# Patient Record
Sex: Male | Born: 1935 | Race: White | Hispanic: No | Marital: Married | Smoking: Never smoker
Health system: Southern US, Community
[De-identification: ages and names within clinical notes are randomized; demographics above are authoritative.]

## PROBLEM LIST (undated history)

## (undated) DIAGNOSIS — R351 Nocturia: Secondary | ICD-10-CM

## (undated) DIAGNOSIS — R011 Cardiac murmur, unspecified: Secondary | ICD-10-CM

## (undated) DIAGNOSIS — Z77090 Contact with and (suspected) exposure to asbestos: Secondary | ICD-10-CM

## (undated) DIAGNOSIS — N400 Enlarged prostate without lower urinary tract symptoms: Secondary | ICD-10-CM

## (undated) DIAGNOSIS — D494 Neoplasm of unspecified behavior of bladder: Secondary | ICD-10-CM

## (undated) DIAGNOSIS — J61 Pneumoconiosis due to asbestos and other mineral fibers: Secondary | ICD-10-CM

## (undated) DIAGNOSIS — Z86718 Personal history of other venous thrombosis and embolism: Secondary | ICD-10-CM

## (undated) DIAGNOSIS — T148XXA Other injury of unspecified body region, initial encounter: Secondary | ICD-10-CM

## (undated) DIAGNOSIS — Z87898 Personal history of other specified conditions: Secondary | ICD-10-CM

## (undated) DIAGNOSIS — C4490 Unspecified malignant neoplasm of skin, unspecified: Secondary | ICD-10-CM

## (undated) DIAGNOSIS — Z923 Personal history of irradiation: Secondary | ICD-10-CM

## (undated) DIAGNOSIS — Z86711 Personal history of pulmonary embolism: Secondary | ICD-10-CM

## (undated) DIAGNOSIS — C679 Malignant neoplasm of bladder, unspecified: Secondary | ICD-10-CM

## (undated) DIAGNOSIS — Z9889 Other specified postprocedural states: Secondary | ICD-10-CM

## (undated) HISTORY — DX: Malignant neoplasm of bladder, unspecified: C67.9

## (undated) HISTORY — PX: TRANSTHORACIC ECHOCARDIOGRAM: SHX275

## (undated) HISTORY — PX: TRANSURETHRAL RESECTION OF PROSTATE: SHX73

## (undated) HISTORY — PX: OTHER SURGICAL HISTORY: SHX169

## (undated) HISTORY — DX: Personal history of irradiation: Z92.3

---

## 1999-05-28 ENCOUNTER — Encounter: Admission: RE | Admit: 1999-05-28 | Discharge: 1999-05-28 | Payer: Self-pay

## 1999-06-01 ENCOUNTER — Ambulatory Visit (HOSPITAL_BASED_OUTPATIENT_CLINIC_OR_DEPARTMENT_OTHER): Admission: RE | Admit: 1999-06-01 | Discharge: 1999-06-01 | Payer: Self-pay

## 1999-06-01 HISTORY — PX: OTHER SURGICAL HISTORY: SHX169

## 2000-01-13 ENCOUNTER — Encounter: Payer: Self-pay | Admitting: Emergency Medicine

## 2000-01-13 ENCOUNTER — Inpatient Hospital Stay (HOSPITAL_COMMUNITY): Admission: EM | Admit: 2000-01-13 | Discharge: 2000-01-14 | Payer: Self-pay | Admitting: Emergency Medicine

## 2000-01-13 HISTORY — PX: OTHER SURGICAL HISTORY: SHX169

## 2000-01-21 ENCOUNTER — Ambulatory Visit (HOSPITAL_COMMUNITY): Admission: RE | Admit: 2000-01-21 | Discharge: 2000-01-21 | Payer: Self-pay | Admitting: Urology

## 2000-01-31 ENCOUNTER — Emergency Department (HOSPITAL_COMMUNITY): Admission: EM | Admit: 2000-01-31 | Discharge: 2000-01-31 | Payer: Self-pay | Admitting: Emergency Medicine

## 2000-11-17 ENCOUNTER — Observation Stay (HOSPITAL_COMMUNITY): Admission: RE | Admit: 2000-11-17 | Discharge: 2000-11-18 | Payer: Self-pay | Admitting: Urology

## 2000-11-17 ENCOUNTER — Encounter (INDEPENDENT_AMBULATORY_CARE_PROVIDER_SITE_OTHER): Payer: Self-pay | Admitting: Specialist

## 2005-05-31 ENCOUNTER — Emergency Department (HOSPITAL_COMMUNITY): Admission: EM | Admit: 2005-05-31 | Discharge: 2005-05-31 | Payer: Self-pay | Admitting: Emergency Medicine

## 2009-10-10 HISTORY — PX: VENA CAVA FILTER PLACEMENT: SUR1032

## 2010-06-30 ENCOUNTER — Ambulatory Visit
Admission: RE | Admit: 2010-06-30 | Discharge: 2010-06-30 | Payer: Self-pay | Source: Home / Self Care | Attending: Urology | Admitting: Urology

## 2010-06-30 HISTORY — PX: TRANSURETHRAL RESECTION OF BLADDER TUMOR: SHX2575

## 2010-07-31 ENCOUNTER — Ambulatory Visit
Admission: RE | Admit: 2010-07-31 | Discharge: 2010-08-01 | Payer: Self-pay | Source: Home / Self Care | Attending: Urology | Admitting: Urology

## 2010-07-31 HISTORY — PX: TRANSURETHRAL RESECTION OF PROSTATE: SHX73

## 2010-08-03 LAB — POCT HEMOGLOBIN-HEMACUE: Hemoglobin: 13.3 g/dL (ref 13.0–17.0)

## 2010-08-14 NOTE — Op Note (Signed)
NAME:  Omar [name], Omar [name] NO.:  [id number]  MEDICAL RECORD NO.:  [id number]          PATIENT TYPE:  AMB  LOCATION:  [facility]                         FACILITY:  [facility]  PHYSICIAN:  Danae Chen, M.D.  DATE OF BIRTH:  10/01/1915  DATE OF PROCEDURE:  07/31/2010 DATE OF DISCHARGE:                              OPERATIVE REPORT   PREOPERATIVE DIAGNOSES: 1. Benign prostatic hypertrophy. 2. Bladder tumor.  POSTOPERATIVE DIAGNOSES: 1. Benign prostatic hypertrophy. 2. Bladder tumor.  PROCEDURES: 1. Cystoscopy. 2. Transurethral resection of prostate. 3. Transurethral resection, bladder tumor.  SURGEON:  Danae Chen, M.D.  ANESTHESIA:  General.  INDICATIONS:  The patient is a 78 year old who was found on cystoscopy for microhematuria to have a bladder tumor.  The prostatic urethra is very tortuous.  He had previous TURP in the past.  He had a TUR of the bladder tumor about a month ago, however, it was difficult to complete the resection because of the tortuous urethra and the location of the tumor on the anterior wall of the bladder near the bladder dome.  A repeat cystoscopy showed residual tumor in the bladder.  He is scheduled today for cystoscopy, resection of prostate tissue and TUR of bladder tumor.  DESCRIPTION OF PROCEDURE:  The patient was identified by his wristband and proper time-out was taken.  Under general anesthesia, he was prepped and draped and placed in the dorsal lithotomy position.  A panendoscope was inserted in the bladder. The anterior urethra was normal.  The prostatic urethra was very tortuous and making difficult to insert the cystoscope in the bladder but the cystoscope could be passed in the bladder.  The bladder was trabeculated.  There was a 3-cm tumor on the anterior wall of the bladder near the dome.  The ureteral orifices were in normal position and shape.  The cystoscope was then removed.  The urethra was dilated up to #28-French  with Sissy Hoff sounds, then a #26 long resectoscope was inserted in the bladder.  Resection of the prostate gland mainly the right lateral and left lateral lobes of the prostate was done from the bladder neck to the verumontanum.  The patient had a long prostatic urethra.  The prostatic chips were then irrigated out of the bladder. Hemostasis was secured with electrocautery.  There was minimal bleeding at the end of resection of the prostate.  Then, attention was turned to the bladder tumor on the anterior wall of the bladder near the dome.  With the long resectoscope, I was able to resect all the bladder tumor.  The margins of resection of the bladder tumor were fulgurated over a 2 cm area.  There was no bleeding at the end of the procedure.  The specimen was irrigated out of the bladder. Then the resectoscope was removed.  A #24 Foley catheter was then inserted without difficulty in the bladder.  The patient tolerated the procedure well and left the OR in satisfactory condition to postanesthesia care unit.     Danae Chen, M.D.     MN/MEDQ  D:  07/31/2010  T:  07/31/2010  Job:  [id number]  Electronically Signed by Lindaann Slough M.D. on 08/14/2010 01:09:50 PM

## 2010-09-21 LAB — URINE MICROSCOPIC-ADD ON

## 2010-09-21 LAB — COMPREHENSIVE METABOLIC PANEL
AST: 15 U/L (ref 0–37)
Albumin: 3.3 g/dL — ABNORMAL LOW (ref 3.5–5.2)
Alkaline Phosphatase: 62 U/L (ref 39–117)
BUN: 28 mg/dL — ABNORMAL HIGH (ref 6–23)
Chloride: 108 mEq/L (ref 96–112)
Potassium: 3.8 mEq/L (ref 3.5–5.1)
Total Bilirubin: 0.7 mg/dL (ref 0.3–1.2)

## 2010-09-21 LAB — DIFFERENTIAL
Basophils Absolute: 0 10*3/uL (ref 0.0–0.1)
Basophils Relative: 0 % (ref 0–1)
Eosinophils Relative: 3 % (ref 0–5)
Monocytes Absolute: 0.5 10*3/uL (ref 0.1–1.0)
Neutro Abs: 4.4 10*3/uL (ref 1.7–7.7)

## 2010-09-21 LAB — URINALYSIS, ROUTINE W REFLEX MICROSCOPIC
Glucose, UA: NEGATIVE mg/dL
Ketones, ur: 40 mg/dL — AB
Nitrite: POSITIVE — AB
Protein, ur: >300 mg/dL — AB
Specific Gravity, Urine: 1.01 (ref 1.005–1.030)
Urobilinogen, UA: 0.2 mg/dL (ref 0.0–1.0)
pH: 5 (ref 5.0–8.0)

## 2010-09-21 LAB — CBC
Hemoglobin: 14.3 g/dL (ref 13.0–17.0)
MCV: 91.9 fL (ref 78.0–100.0)
Platelets: 124 10*3/uL — ABNORMAL LOW (ref 150–400)
RBC: 4.43 MIL/uL (ref 4.22–5.81)
WBC: 6.2 10*3/uL (ref 4.0–10.5)

## 2010-09-21 LAB — URINE CULTURE: Colony Count: NO GROWTH

## 2010-11-27 NOTE — H&P (Signed)
[facility]  Patient:    Omar [name], Omar [name]                    MRN: [id number] Adm. Date:  01/13/00 Attending:  Verl Dicker, M.D. CC:         Hadassah Pais. Jeannetta Nap, M.D.                         History and Physical  DATE OF BIRTH:  [birth year]  REFERRING PHYSICIAN:  Hadassah Pais. Jeannetta Nap, M.D.  UROLOGIST:  Claudette Laws, M.D.  ON-CALL UROLOGIST:  Verl Dicker, M.D.  CHIEF COMPLAINT:  This 77 year old white male with retention and impassable bladder neck contracture.  HISTORY OF PRESENT ILLNESS: 1. In 1999, a TURP for benign prostatic hypertrophy by Dr. Etta Grandchild. 2. In 2001, the patient has had progressive obstructive voiding symptoms.    No improvement despite appropriate antibiotic therapy. 3. On January 13, 2000, the patient claims a relatively recent onset of markedly    decreased force of stream with frequency, unable to pass a Foley catheter.    There was blood at the meatus at the time the patient was seen in the    emergency room by Dr. Wanda Plump.  A flexible cystoscopy was done in the    emergency room showing an impassable bladder neck contracture.  Plans made    for a cystoscopy under anesthesia and possible SP tube placement.  CURRENT MEDICATIONS:   Antibiotics p.o. per Dr. Etta Grandchild over the last several weeks.  ALLERGIES:  Denied.  SOCIAL HISTORY:  Tobacco:  Denies.  ETOH:  Denies.  The patient is a resident of [place], accompanied by his wife.  PAST MEDICAL HISTORY:  Inguinal hernia repair in December 2000, Dr. Zigmund Daniel.  PHYSICAL EXAMINATION/REVIEW OF SYSTEMS:  VITAL SIGNS:  Temperature 97 degrees, respirations 16, pulse 58, blood pressure  148/65.  HEENT/NECK:  Negative adenopathy, negative bruit.  LUNGS:  Clear to P&A.  HEART:  Regular rate and rhythm, without murmur or gallop.  ABDOMEN:  Soft, protuberant.  Well-repaired hernia incisions.  No evidence of recurrent hernia.  GENITOURINARY:   Bilaterally distended testes.  RECTAL:  Smooth rounded prostate.  NEUROLOGIC:  Grossly intact.  LABORATORY DATA:  Electrocardiogram:  Sinus bradycardia with left anterior fascicular block.  White count 6.5, hemoglobin 15.8, hematocrit 46.6, platelets 157.  PTT 27, PT 13.3. BMET pending at the time of this dictation.  Chest x-ray pending at the time of this dictation.  IMPRESSION:  An 77 year old male with bladder neck contracture, two years after a transurethral resection of the prostate.  Unable to place a Foley catheter.  PLAN:  Give 80 mg of IM gentamicin, 1 g of IV Ancef.  A cystoscopy under anesthesia.  Possible DVIU.  Possible TURBNC, possible SP tube placement. DD:  01/13/00 TD:  01/13/00 Job: [id number]

## 2010-11-27 NOTE — Op Note (Signed)
[facility]  Patient:    Omar [name], Omar [name]                     MRN: [id number] Proc. Date: 11/17/00 Adm. Date:  [id number] Attending:  Monica Becton                           Operative Report  PREOPERATIVE DIAGNOSES: 1. Benign prostatic hypertrophy with acute urinary retention. 2. Status post transurethral resection of prostate in 1999. 3. History of bladder neck contracture status post direct vision urethrotomy.  POSTOPERATIVE DIAGNOSES: 1. Benign prostatic hypertrophy with acute urinary retention. 2. Status post transurethral resection of prostate in 1999. 3. History of bladder neck contracture status post direct vision urethrotomy.  OPERATION PERFORMED:  Cystoscopy and transurethral resection of prostate gland.  SURGEON:  Dr. Etta Grandchild.  DESCRIPTION OF PROCEDURE:  The patient was prepped and draped in the dorsal lithotomy position under spinal anesthesia. His Foley catheter was removed. He was given IV gentamycin preop.  Cystoscopy confirmed a normal anterior urethra. He had a rather elongated prostatic urethra with multiple inflammatory bladder neck polyps. There was some residual tissue anteriorly and also at the apical area. Otherwise the bladder neck seemed to be wide open. The bladder itself showed no obvious tumors although it was somewhat mirky, bloody and had some catheter cystitis. The orifices were not identifiable. Following urethral dilation with Sissy Hoff sounds to a #30 [place], a 28 French gyrus resectoscope unit was placed into the bladder with the Wandra Scot working element transurethral with resecting, resecting out the residual tissue anteriorly, laterally and toward the apex, only a few grams were obtained. There was minimal bleeding throughout. I evacuated out all clots and chips at the end and put in a 22 [place] 30 cc Nigel Berthold, he was clear. He was then taken back to the recovery room in satisfactory condition. DD:   11/17/00 TD:  11/17/00 Job: [id number]

## 2010-11-27 NOTE — Op Note (Signed)
[facility]  Patient:    Omar [name], Omar [name]                    MRN: [id number] Attending:  Verl Dicker, M.D.                           Operative Report  DATE OF BIRTH:  29-Oct-2015.  REFERRING PHYSICIAN:  ___, M.D.  UROLOGISTVerl Dicker, M.D.             Ron ___, M.D.  PREOPERATIVE DIAGNOSIS:  Impassable bladder neck contracture.  POSTOPERATIVE DIAGNOSIS:  Impassable bladder neck contracture.  PROCEDURE:  Cystoscopy under anesthesia and placement of open SP tube.  ANESTHESIA:  Spinal.  DRAINS:  24 French Ainsworth SP tube.  SPECIMENS:  None.  COMPLICATIONS:  None.  DESCRIPTION OF PROCEDURE:  The patient was prepped and draped in the dorsal lithotomy position after institution of adequate level of spinal anesthesia where a lubricated 21 French Penn endoscope was gently inserted at the urethral meatus.  Normal urethra and sphincter.  Patient had impassable contracture of the bladder neck.  Careful examination of the area with cystoscope showed no passage to the bladder neck.  The area was carefully probed with a 5 French ureteral catheter.  No tract or path could be established to the bladder despite carefully looking for a period of over 20-30 minutes.  The decision was made to forego further cystoscopy, as varices within the previous prostatic urethra made visualization difficulty.  Patient was then taken out of lithotomy, reprepped and draped.  A 6 cm transverse incision was made above the symphysis pubis through the skin and subcutaneous tissue.  Fibers of the rectus abdominis were then divided horizontally. Muscular fibers beneath the rectus sheath were then divided vertically. Distended bladder was easily identified.  1-2 cm incision was then made in the anterior bladder with immediate return of about 700 cc of pink-tinged urine. A 24 [place] Ainsworth catheter with a 30 cc balloon was then brought through the skin and  fascia, directed into the bladder, purse-string suture of 0 chromic used on the anterior bladder wall.  Rectus fascia was reapproximated with interrupted sutures of 0 Vicryl.  Skin was reapproximated with skin staples.  Ainsworth catheter was sewn to the skin with a 2-0 nylon stitch. Pink urine irrigated easily.  SP tube was left to straight drain.  No drain was left indwelling and the patient was returned to recovery in satisfactory condition. DD:  01/13/00 TD:  01/13/00 Job: [id number]

## 2010-11-27 NOTE — Op Note (Signed)
[facility]  Patient:    Omar [name], Omar [name]                     MRN: [id number] Proc. Date: 01/13/00 Adm. Date:  [id number] Attending:  Ellwood Handler                           Operative Report  HISTORY:  This is an 77 year old gentleman who came in yesterday with an impassible bladder neck contracture.  He was seen by Dr. Verl Dicker, who took him to surgery and put in a suprapubic cystostomy tube.  By history, the patient had a TUR in 1999 and recently has had progressive symptoms of bladder outlet obstruction.  I had seen him recently in the office and put him on some antibiotics; otherwise, the rest of the history is documented on the admitting H&P by Dr. Verl Dicker.  LABORATORY AND X-RAY FINDINGS:  Hemoglobin 15.8, hematocrit 46.1, white cell count was 6500.  His PT was 27, ______ was 13.3 and INR was 1.1.  Chest x-ray showed moderate cardiomegaly and aortic elongation and calcification and some pleural thickening.  EKG showed marked sinus bradycardia with sinus arrhythmia.  COURSE IN THE HOSPITAL:  The patient was seen early in the day on January 13, 2000 by Dr. Verl Dicker, taken to surgery, underwent cystoscopy and insertion of an SP tube, a 24-French Ainsworth catheter.  Postoperatively, he did well.  I saw him the next day.  He was feeling better.  We discussed followup care and he is to come into the office for his routine appointment on Monday, so I thought I would see him and then make plans for a TUR of bladder neck contracture possibly next week, once he settles down.  The patient has some antibiotics at home and pain pills, which he will continue to take.  We instructed him on how to hook the SP tube to a leg bag.  FINAL DIAGNOSIS:  Impassible bladder neck contracture.  OPERATION:  Cystoscopy and insertion of a suprapubic cystostomy under spinal anesthesia (a 24-French Ainsworth) suprapubic  tube.  COMPLICATIONS:  None.  CONDITION ON DISCHARGE:  Improving.  DISCHARGE MEDICATIONS:  His antibiotics, pain pills, all other home medicines.  FOLLOWUP:  To see me in four days for followup. DD:  01/14/00 TD:  01/14/00 Job: [id number]

## 2010-11-27 NOTE — Op Note (Signed)
[place]  Patient:    Omar [name]                      MRN: [id number] Proc. Date: 06/01/99 Adm. Date:  [id number] Attending:  Meredith Leeds                           Operative Report  PREOPERATIVE DIAGNOSIS:  Recurrent indirect left inguinal hernia.  POSTOPERATIVE DIAGNOSIS:  Recurrent indirect left inguinal hernia.  OPERATION PERFORMED:  Repair of inguinal hernia.  SURGEON:  Zigmund Daniel, M.D.  ANESTHESIA:  Local with monitored anesthesia care and sedation.  DESCRIPTION OF PROCEDURE:  Following adequate monitoring and sedation and routine preparation and draping of the left inguinal region, I liberally infused a local anesthetic in the operative area.  I reinforced that after I had deepened the incision.  I removed the old skin scar and dissected down through the fat to the external oblique and cleared the fat away from it a short distance, identified he external ring and opened the aponeurosis of the external oblique in the direction of the fibers into the external ring.  I then carefully dissected up the spermatic cord, dissecting it off the floor and away from the external oblique taking care not to injure the ilioinguinal nerve.  I controlled it with a Penrose drain and I also identified the inferior epigastric artery and the vas deferens and did not  harm them.  I saw that there was no direct hernia.  I then dissected the proximal part of the spermatic cord and identified a large indirect hernia and some preperitoneal fat and dissected that away from the vessels and the vas, reduced it through the superior part of the internal ring and held it in with a plug of Marlex mesh which I held in with a single suture of 2-0 silk.  I then fashioned a patch of Marlex mesh with a slit made in it to fit on the inguinal floor and allow eggress of the spermatic cord.  I sewed that in place from the pubic tubercle  laterally and inferiorly along the inguinal ligament and superiorly and medially in the internal oblique fascia and joined the tails of the mesh together with two sutures lateral to the internal ring.  I felt that the repair was secure.  Hemostasis was good. I closed the external oblique with running 3-0 Vicryl, closed the subcutaneous tissues with running 3-0 Vicryl and closed the skin with intracuticular 4-0 Vicryl and Steri-Strips.  The patient tolerated the operation well. DD:  06/01/99 TD:  06/02/99 Job: [id number]

## 2010-11-27 NOTE — Discharge Summary (Signed)
[facility]  Patient:    Omar [name], Omar [name]                     MRN: [id number] Proc. Date: 11/17/00 Adm. Date:  [id number] Disc. Date: [id number] Attending:  Monica Becton                           Discharge Summary  HISTORY OF PRESENT ILLNESS:  This is an 77 year old man who developed acute urinary retention about a week ago from residual BPH.  He had a TUR about three years ago and, then, developed a bladder neck contracture last year which eventually required a direct vision urethrotomy.  Since then, he has been doing well although recently he developed progressive symptoms of retention.  A catheter was placed in the office.  Several hundred ccs of urine was obtained.  A cystoscopy at that time confirmed some residual BPH which I thought should be resected out.  Patient came in as an a.m. admission for a TUR.  He has been tolerating his catheter well the past week.  He has been off aspirin now for about one week.  His EKG showed sinus bradycardia. Electrolytes were normal with a BUN of 21, a creatinine of 1.2.  Random glucose was 126.  INR was 1.2.  Hemoglobin 14.6, hematocrit 43.0.  HOSPITAL COURSE:  The patient came in as a.m. overnight stay on Nov 17, 2000, underwent a TUR of his prostate with the Gyrus resectoscope.  Postoperatively, he did well.  The following morning, we removed the catheter.  He was voiding satisfactorily albeit with slightly bloody urine, no clots, good control.  He had no complaints.  He was feeling good, afebrile and, so, he was discharged.  The pathology report is still pending.  IMPRESSION: 1. Benign prostatic hypertrophy with acute urinary retention. 2. Past history of a transurethral resection of the prostate and direct vision    urethrotomy for bladder neck contracture.  OPERATION:  Cystoscopy, transurethral resection of prostate.  COMPLICATIONS:  None.  CONDITION ON DISCHARGE:  Recovering.  DISCHARGE  MEDICATIONS: 1. Levaquin 250 q.d., #5. 2. Pyridium 200 mg every 4 hours for burning.  He will stay off his aspirin for one month now but renew all of his other home medications.  DISPOSITION:  Regular diet, force fluids, limited activity.  To see me in the office in the next two to three weeks. DD:  11/18/00 TD:  11/19/00 Job: [id number]

## 2012-10-16 ENCOUNTER — Encounter (HOSPITAL_BASED_OUTPATIENT_CLINIC_OR_DEPARTMENT_OTHER): Payer: Self-pay | Admitting: *Deleted

## 2012-10-16 ENCOUNTER — Other Ambulatory Visit: Payer: Self-pay | Admitting: Urology

## 2012-10-16 NOTE — Progress Notes (Addendum)
SPOKE W/ PT AND LM FOR SON, JERRY, TO CALL BACK AND WILL GIVE INSTRUCTIONS TO HIM AS WELL. NPO AFTER MN. ARRIVES AT 0945. PT STATES CXR 2 WKS AGO..  WILL GET INFO FROM SON AND REQUEST IT TO BE FAXED.  SPOKE W/ PT SON, JERRY, TODAY.  HE STATES PT HAD CXR DONE AT DR Jeannetta Nap 2 WKS AGO. BUT WHEN I CALLED DR Jeannetta Nap OFFICE STATES NO RECORD OF REPORT. CALLED SON BACK AND HE STATES HE WILL CALL THE OFFICE AND CLARIFY THIS AND GIVE THEM MY PHONE # TO HAVE THE REPORT FAXED. ALSO, CALLED DR Donnie Aho OFFICE FOR EKG AND ECHO TO BE FAXED.  INSTRUCTIONS GIVEN TO SON , NPO AFTER MN , ARRIVE AT 0945 , AND BRING PT MED. LIST TO VERIFY.  PT SON CALLED AND LM THAT HE HAS CXR REPORT , WILL BRING DOS.

## 2012-10-17 ENCOUNTER — Encounter (HOSPITAL_BASED_OUTPATIENT_CLINIC_OR_DEPARTMENT_OTHER): Payer: Self-pay | Admitting: *Deleted

## 2012-10-20 ENCOUNTER — Encounter (HOSPITAL_BASED_OUTPATIENT_CLINIC_OR_DEPARTMENT_OTHER)
Admission: RE | Disposition: A | Discharge status: Home / Self Care | Payer: Self-pay | Source: Ambulatory Visit | Attending: Urology

## 2012-10-20 ENCOUNTER — Encounter (HOSPITAL_BASED_OUTPATIENT_CLINIC_OR_DEPARTMENT_OTHER): Payer: Self-pay

## 2012-10-20 ENCOUNTER — Ambulatory Visit (HOSPITAL_BASED_OUTPATIENT_CLINIC_OR_DEPARTMENT_OTHER)
Admission: RE | Admit: 2012-10-20 | Discharge: 2012-10-20 | Disposition: A | Discharge status: Home / Self Care | Payer: Medicare Other | Source: Ambulatory Visit | Attending: Urology | Admitting: Urology

## 2012-10-20 ENCOUNTER — Encounter (HOSPITAL_BASED_OUTPATIENT_CLINIC_OR_DEPARTMENT_OTHER): Payer: Self-pay | Admitting: Anesthesiology

## 2012-10-20 ENCOUNTER — Ambulatory Visit (HOSPITAL_BASED_OUTPATIENT_CLINIC_OR_DEPARTMENT_OTHER): Payer: Medicare Other | Admitting: Anesthesiology

## 2012-10-20 DIAGNOSIS — Z79899 Other long term (current) drug therapy: Secondary | ICD-10-CM | POA: Insufficient documentation

## 2012-10-20 DIAGNOSIS — C679 Malignant neoplasm of bladder, unspecified: Secondary | ICD-10-CM | POA: Insufficient documentation

## 2012-10-20 DIAGNOSIS — Z7982 Long term (current) use of aspirin: Secondary | ICD-10-CM | POA: Insufficient documentation

## 2012-10-20 DIAGNOSIS — N3289 Other specified disorders of bladder: Secondary | ICD-10-CM | POA: Insufficient documentation

## 2012-10-20 DIAGNOSIS — N323 Diverticulum of bladder: Secondary | ICD-10-CM | POA: Insufficient documentation

## 2012-10-20 HISTORY — DX: Personal history of other venous thrombosis and embolism: Z86.718

## 2012-10-20 HISTORY — DX: Other specified postprocedural states: Z98.890

## 2012-10-20 HISTORY — DX: Contact with and (suspected) exposure to asbestos: Z77.090

## 2012-10-20 HISTORY — DX: Nocturia: R35.1

## 2012-10-20 HISTORY — DX: Malignant neoplasm of bladder, unspecified: C67.9

## 2012-10-20 HISTORY — PX: TRANSURETHRAL RESECTION OF BLADDER TUMOR: SHX2575

## 2012-10-20 HISTORY — DX: Cardiac murmur, unspecified: R01.1

## 2012-10-20 HISTORY — DX: Benign prostatic hyperplasia without lower urinary tract symptoms: N40.0

## 2012-10-20 HISTORY — PX: CYSTOSCOPY: SHX5120

## 2012-10-20 HISTORY — DX: Personal history of other specified conditions: Z87.898

## 2012-10-20 HISTORY — DX: Personal history of pulmonary embolism: Z86.711

## 2012-10-20 HISTORY — DX: Neoplasm of unspecified behavior of bladder: D49.4

## 2012-10-20 SURGERY — TURBT (TRANSURETHRAL RESECTION OF BLADDER TUMOR)
Anesthesia: General | Site: Bladder | Wound class: Clean Contaminated

## 2012-10-20 MED ORDER — EPHEDRINE SULFATE 50 MG/ML IJ SOLN
INTRAMUSCULAR | Status: DC | PRN
  Administered 2012-10-20: 10 mg via INTRAVENOUS

## 2012-10-20 MED ORDER — FENTANYL CITRATE 0.05 MG/ML IJ SOLN
25.0000 ug | INTRAMUSCULAR | Status: DC | PRN
  Filled 2012-10-20: qty 1

## 2012-10-20 MED ORDER — LIDOCAINE HCL (CARDIAC) 20 MG/ML IV SOLN
INTRAVENOUS | Status: DC | PRN
  Administered 2012-10-20: 60 mg via INTRAVENOUS

## 2012-10-20 MED ORDER — DEXAMETHASONE SODIUM PHOSPHATE 4 MG/ML IJ SOLN
INTRAMUSCULAR | Status: DC | PRN
  Administered 2012-10-20: 4 mg via INTRAVENOUS

## 2012-10-20 MED ORDER — CEFAZOLIN SODIUM 1-5 GM-% IV SOLN
1.0000 g | INTRAVENOUS | Status: DC
  Filled 2012-10-20: qty 50

## 2012-10-20 MED ORDER — PROMETHAZINE HCL 25 MG/ML IJ SOLN
6.2500 mg | INTRAMUSCULAR | Status: DC | PRN
  Filled 2012-10-20: qty 1

## 2012-10-20 MED ORDER — GLYCINE 1.5 % IR SOLN
Status: DC | PRN
  Administered 2012-10-20: 18000 mL

## 2012-10-20 MED ORDER — SODIUM CHLORIDE 0.9 % IR SOLN
Status: DC | PRN
  Administered 2012-10-20: 9000 mL

## 2012-10-20 MED ORDER — CEFAZOLIN SODIUM-DEXTROSE 2-3 GM-% IV SOLR
2.0000 g | INTRAVENOUS | Status: AC
  Administered 2012-10-20: 2 g via INTRAVENOUS
  Filled 2012-10-20: qty 50

## 2012-10-20 MED ORDER — PROPOFOL 10 MG/ML IV BOLUS
INTRAVENOUS | Status: DC | PRN
  Administered 2012-10-20: 130 mg via INTRAVENOUS

## 2012-10-20 MED ORDER — FENTANYL CITRATE 0.05 MG/ML IJ SOLN
INTRAMUSCULAR | Status: DC | PRN
  Administered 2012-10-20 (×2): 25 ug via INTRAVENOUS
  Administered 2012-10-20: 50 ug via INTRAVENOUS
  Administered 2012-10-20: 25 ug via INTRAVENOUS

## 2012-10-20 MED ORDER — ONDANSETRON HCL 4 MG/2ML IJ SOLN
INTRAMUSCULAR | Status: DC | PRN
  Administered 2012-10-20: 4 mg via INTRAVENOUS

## 2012-10-20 MED ORDER — MEPERIDINE HCL 25 MG/ML IJ SOLN
6.2500 mg | INTRAMUSCULAR | Status: DC | PRN
  Filled 2012-10-20: qty 1

## 2012-10-20 MED ORDER — LACTATED RINGERS IV SOLN
INTRAVENOUS | Status: DC
  Administered 2012-10-20 (×2): via INTRAVENOUS
  Filled 2012-10-20: qty 1000

## 2012-10-20 SURGICAL SUPPLY — 40 items
BAG DRAIN URO-CYSTO SKYTR STRL (DRAIN) ×2 IMPLANT
BAG DRN ANRFLXCHMBR STRAP LEK (BAG)
BAG DRN UROCATH (DRAIN) ×1
BAG URINE DRAINAGE (UROLOGICAL SUPPLIES) ×1 IMPLANT
BAG URINE LEG 19OZ MD ST LTX (BAG) IMPLANT
CANISTER SUCT LVC 12 LTR MEDI- (MISCELLANEOUS) ×3 IMPLANT
CATH FOLEY 2WAY SLVR  5CC 20FR (CATHETERS)
CATH FOLEY 2WAY SLVR  5CC 22FR (CATHETERS)
CATH FOLEY 2WAY SLVR 5CC 20FR (CATHETERS) IMPLANT
CATH FOLEY 2WAY SLVR 5CC 22FR (CATHETERS) IMPLANT
CLOTH BEACON ORANGE TIMEOUT ST (SAFETY) ×2 IMPLANT
DRAPE CAMERA CLOSED 9X96 (DRAPES) IMPLANT
ELECT LOOP HF 26F 30D .35MM (CUTTING LOOP) IMPLANT
ELECT LOOP MED HF 24F 12D CBL (CLIP) ×1 IMPLANT
ELECT REM PT RETURN 9FT ADLT (ELECTROSURGICAL) ×2
ELECTRODE REM PT RTRN 9FT ADLT (ELECTROSURGICAL) ×1 IMPLANT
EVACUATOR MICROVAS BLADDER (UROLOGICAL SUPPLIES) ×2 IMPLANT
GLOVE BIO SURGEON STRL SZ7 (GLOVE) ×1 IMPLANT
GLOVE BIOGEL PI IND STRL 7.0 (GLOVE) IMPLANT
GLOVE BIOGEL PI INDICATOR 7.0 (GLOVE) ×2
GLOVE SKINSENSE NS SZ7.0 (GLOVE) ×2
GLOVE SKINSENSE NS SZ7.5 (GLOVE) ×1
GLOVE SKINSENSE STRL SZ7.0 (GLOVE) IMPLANT
GLOVE SKINSENSE STRL SZ7.5 (GLOVE) IMPLANT
GLYCINE 1.5% IRRIG UROMATIC (IV SOLUTION) ×6 IMPLANT
HOLDER FOLEY CATH W/STRAP (MISCELLANEOUS) ×1 IMPLANT
IV NS IRRIG 3000ML ARTHROMATIC (IV SOLUTION) ×3 IMPLANT
KIT ASPIRATION TUBING (SET/KITS/TRAYS/PACK) ×1 IMPLANT
LOOP CUTTING 24FR OLYMPUS (CUTTING LOOP) IMPLANT
NDL SAFETY ECLIPSE 18X1.5 (NEEDLE) IMPLANT
NEEDLE HYPO 18GX1.5 SHARP (NEEDLE)
NEEDLE HYPO 22GX1.5 SAFETY (NEEDLE) IMPLANT
NS IRRIG 500ML POUR BTL (IV SOLUTION) ×1 IMPLANT
PACK CYSTOSCOPY (CUSTOM PROCEDURE TRAY) ×2 IMPLANT
PLUG CATH AND CAP STER (CATHETERS) IMPLANT
SET ASPIRATION TUBING (TUBING) IMPLANT
SILICONE FOLEY ×1 IMPLANT
SYR 20CC LL (SYRINGE) IMPLANT
SYRINGE IRR TOOMEY STRL 70CC (SYRINGE) ×1 IMPLANT
WATER STERILE IRR 3000ML UROMA (IV SOLUTION) ×1 IMPLANT

## 2012-10-20 NOTE — Op Note (Signed)
Omar [name] is a 79 y.o.   10/20/2012  Pre-op diagnosis: Recurrent bladder tumor  Postop diagnosis: Same  Procedure done: Cystoscopy TUR bladder tumor  Surgeon: Wendie Simmer. Roby Donaway  Anesthesia: General  Indication: Patient is a 79 years old male who had a TUR bladder tumor in December 2011. He had not had any recurrence until his last cystoscopy about 2 weeks ago. He is scheduled for cystoscopy and TUR bladder tumor.  Procedure: The patient was identified by his wrist band and proper timeout was taken.  Under general anesthesia he was prepped and draped and placed in the dorsolithotomy position. A panendoscope could not be passed through the meatus because of meatal stenosis. The meatus was then dilated with Von Buren sounds up to #30 [place]. Then a panendoscope was passed in the bladder. The anterior urethra is normal. He has a very long prostatic urethra. He had a previous TURP. I was able to pass the cystoscope in the bladder. The bladder is heavily trabeculated with cellules and diverticula. There is a papillary tumor on the left side of the bladder that measures about 4 cm. There is no evidence of other tumor in the bladder. The cystoscope was removed.  A gyrus resectoscope was inserted in the bladder. I was able to identify the bladder tumor but the resectoscope was short and I was not able to resect the tumor with the short resectoscope. I then inserted along the resectoscope in the bladder. I resected the bladder tumor as much as possible. I believe there is still residual tumor in the bladder. I fulgurated no more site of the bladder tumor as well as a 2 cm margin of resection. Hemostasis was completed with electrocautery. The specimen was then irrigated out of the bladder. The resectoscope was removed. A #20 French Foley catheter was inserted in the bladder. The catheter will be left to straight drainage.   EBL: Minimal.  The patient tolerated the procedure well and left the OR in  satisfactory condition to postanesthesia care unit.  We will await pathology report for further a decision regarding management of the bladder tumor. He would probably need CT scan of the abdomen and pelvis to assess the extent of the disease.  Cc: Dr. Windle Guard        Dr. Viann Fish

## 2012-10-20 NOTE — H&P (Signed)
History of Present Illness     Omar [name] returns today for surveillance cystoscopy.  He had TURP and TURBT in December 2011.  He has not had any recurrence since.  He has seen blood in his urine intermittently.  Urine culture 2 weeks ago was negative.  Cystoscopy shows recurrent bladder tumor on the left lateral wall of the bladder that measures about 5 cm.  He needs TURBT.  Will contact Dr Windle Guard for clearance.   Surgical History Problems  1. History of  Cystoscopy With Fulguration Large Lesion (Over 5cm) 2. History of  Cystoscopy With Fulguration Medium Lesion (2-5cm) 3. History of  Inguinal Hernia Repair 4. History of  Transuret Resect Of Regrow Obstr Tis Longer Than 1 Yr Postop 5. History of  Transurethral Resection Of Prostate  Current Meds 1. Aspirin 81 MG Oral Tablet; Therapy: (Recorded:27Dec2011) to 2. Calcium 600 TABS; Therapy: (Recorded:14Dec2007) to 3. Glucosamine-Chondroitin TABS; Therapy: (Recorded:22May2008) to 4. Levofloxacin 250 MG Oral Tablet; TAKE 1 TABLET DAILY; Therapy: 04Mar2014 to  (Evaluate:11Mar2014)  Requested for: 04Mar2014; Last Rx:04Mar2014 5. Lutein TABS; Therapy: (Recorded:14Dec2007) to 6. Multi-Vitamin TABS; Therapy: (Recorded:22May2008) to 7. Saw Palmetto CAPS; Therapy: (Recorded:14Dec2007) to 8. Tamsulosin HCl 0.4 MG Oral Capsule; TAKE 1 CAPSULE Bedtime; Therapy: 26May2011 to (Last  Rx:01Dec2011) 9. Viagra 50 MG Oral Tablet; Take 1 tablet as needed; Therapy: 04Nov2008 to  (Evaluate:03May2009); Last Rx:04Nov2008  Allergies Medication  1. No Known Drug Allergies  Family History Problems  1. Family history of  Family Health Status Number Of Children 4  Social History Problems  1. Family history of  Death In The Family Father 61 2. Family history of  Death In The Family Mother 33 3. Marital History - Currently Married 4. Never A Smoker 5. Occupation: Retired Denied  6. Alcohol Use 7. Tobacco Use V15.82  Review of  Systems Genitourinary, constitutional, skin, eye, otolaryngeal, hematologic/lymphatic, cardiovascular, pulmonary, endocrine, musculoskeletal, gastrointestinal, neurological and psychiatric system(s) were reviewed and pertinent findings if present are noted.    Physical Exam Constitutional: Well nourished and well developed . No acute distress.  ENT:. The ears and nose are normal in appearance.  Neck: The appearance of the neck is normal and no neck mass is present.  Pulmonary: No respiratory distress and normal respiratory rhythm and effort.  Cardiovascular: Heart rate and rhythm are normal . No peripheral edema.  Abdomen: The abdomen is soft and nontender. No masses are palpated. No CVA tenderness. No hernias are palpable. No hepatosplenomegaly noted.  Genitourinary: Examination of the penis demonstrates no discharge, no masses, no lesions and a normal meatus. The scrotum is without lesions. The right epididymis is palpably normal and non-tender. The left epididymis is palpably normal and non-tender. The right testis is non-tender and without masses. The left testis is non-tender and without masses.  Lymphatics: The femoral and inguinal nodes are not enlarged or tender.  Skin: Normal skin turgor, no visible rash and no visible skin lesions.  Neuro/Psych:. Mood and affect are appropriate.    Results/Data Urine [Data Includes: Last 1 Day]   21Mar2014  COLOR YELLOW   APPEARANCE CLOUDY   SPECIFIC GRAVITY 1.020   pH 7.0   GLUCOSE NEG mg/dL  BILIRUBIN NEG   KETONE NEG mg/dL  BLOOD LARGE   PROTEIN 30 mg/dL  UROBILINOGEN 0.2 mg/dL  NITRITE POS   LEUKOCYTE ESTERASE LARGE   SQUAMOUS EPITHELIAL/HPF NONE SEEN   WBC TNTC WBC/hpf  RBC 21-50 RBC/hpf  BACTERIA MANY   CRYSTALS NONE SEEN   CASTS  NONE SEEN     PVR is 196 ml  Uroflow showed a peak flow of 17 ml/sec and a mean flow of 5 ml/sec. He voided 399 ml.  Amended By: Su Grand; 09/29/2012 4:41 PMEST  Procedure  Procedure: Cystoscopy    Indication: History of Urothelial Carcinoma.  Informed Consent: Risks, benefits, and potential adverse events were discussed and informed consent was obtained from the patient . Specific risks including, but not limited to bleeding, infection, pain, allergic reaction etc. were explained.  Prep: The patient was prepped with betadine.  Antibiotic prophylaxis: Ciprofloxacin.  Procedure Note:  Urethral meatus:. No abnormalities.  Anterior urethra: No abnormalities.  Prostatic urethra:. The lateral and median prostatic lobes were enlarged.  Bladder: Visulization was obscured due to cloudy urine. The ureteral orifices were in the normal anatomic position bilaterally. Examination of the bladder demonstrated a diverticulum. A solitary tumor was visualized in the bladder. A papillary tumor was seen in the bladder. This tumor was located on the left side, at the base of the bladder. The patient tolerated the procedure well.  Complications: None.    Assessment Assessed  1. Bladder Cancer 188.9  Plan Health Maintenance (V70.0)  1. UA With REFLEX  Done: 21Mar2014 02:15PM   Cysto TURBT.  Needs medical clearance from Dr Jeannetta Nap.   Signatures  CC: Dr Windle Guard  Electronically signed by : Su Grand, M.D.; Sep 29 2012  4:43PM

## 2012-10-20 NOTE — Anesthesia Preprocedure Evaluation (Addendum)
Anesthesia Evaluation  Patient identified by MRN, date of birth, ID band Patient awake    Reviewed: Allergy & Precautions, H&P , NPO status , Patient's Chart, lab work & pertinent test results  Airway Mallampati: II TM Distance: >3 FB Neck ROM: Full    Dental no notable dental hx.    Pulmonary neg pulmonary ROS, PE (s/p IVC filter 4/11) breath sounds clear to auscultation  Pulmonary exam normal       Cardiovascular negative cardio ROS  Rhythm:Regular Rate:Normal     Neuro/Psych negative neurological ROS  negative psych ROS   GI/Hepatic negative GI ROS, Neg liver ROS,   Endo/Other  negative endocrine ROS  Renal/GU negative Renal ROS  negative genitourinary   Musculoskeletal negative musculoskeletal ROS (+)   Abdominal   Peds negative pediatric ROS (+)  Hematology negative hematology ROS (+)   Anesthesia Other Findings   Reproductive/Obstetrics negative OB ROS                          Anesthesia Physical Anesthesia Plan  ASA: II  Anesthesia Plan: General   Post-op Pain Management:    Induction: Intravenous  Airway Management Planned: LMA  Additional Equipment:   Intra-op Plan:   Post-operative Plan:   Informed Consent: I have reviewed the patients History and Physical, chart, labs and discussed the procedure including the risks, benefits and alternatives for the proposed anesthesia with the patient or authorized representative who has indicated his/her understanding and acceptance.   Dental advisory given  Plan Discussed with: CRNA  Anesthesia Plan Comments:         Anesthesia Quick Evaluation

## 2012-10-20 NOTE — Anesthesia Procedure Notes (Signed)
Procedure Name: LMA Insertion Date/Time: 10/20/2012 11:09 AM Performed by: Renella Cunas D Pre-anesthesia Checklist: Patient identified, Emergency Drugs available, Suction available and Patient being monitored Patient Re-evaluated:Patient Re-evaluated prior to inductionOxygen Delivery Method: Circle System Utilized Preoxygenation: Pre-oxygenation with 100% oxygen Intubation Type: IV induction Ventilation: Mask ventilation without difficulty LMA: LMA inserted LMA Size: 5.0 Number of attempts: 1 Airway Equipment and Method: bite block Placement Confirmation: positive ETCO2 Tube secured with: Tape Dental Injury: Teeth and Oropharynx as per pre-operative assessment

## 2012-10-20 NOTE — Transfer of Care (Signed)
Immediate Anesthesia Transfer of Care Note  Patient: Omar [name]  Procedure(s) Performed: Procedure(s) (LRB): TRANSURETHRAL RESECTION OF BLADDER TUMOR (TURBT) (N/A) CYSTOSCOPY (N/A)  Patient Location: PACU  Anesthesia Type: General  Level of Consciousness: awake, oriented, sedated and patient cooperative  Airway & Oxygen Therapy: Patient Spontanous Breathing and Patient connected to face mask oxygen  Post-op Assessment: Report given to PACU RN and Post -op Vital signs reviewed and stable  Post vital signs: Reviewed and stable  Complications: No apparent anesthesia complications

## 2012-10-21 NOTE — Anesthesia Postprocedure Evaluation (Signed)
  Anesthesia Post-op Note  Patient: Omar [name]  Procedure(s) Performed: Procedure(s) (LRB): TRANSURETHRAL RESECTION OF BLADDER TUMOR (TURBT) (N/A) CYSTOSCOPY (N/A)  Patient Location: PACU  Anesthesia Type: General  Level of Consciousness: awake and alert   Airway and Oxygen Therapy: Patient Spontanous Breathing  Post-op Pain: mild  Post-op Assessment: Post-op Vital signs reviewed, Patient's Cardiovascular Status Stable, Respiratory Function Stable, Patent Airway and No signs of Nausea or vomiting  Last Vitals:  Filed Vitals:   10/20/12 1455  BP: 147/67  Pulse: 66  Temp: 35.6 C  Resp: 20    Post-op Vital Signs: stable   Complications: No apparent anesthesia complications

## 2012-10-23 ENCOUNTER — Encounter (HOSPITAL_BASED_OUTPATIENT_CLINIC_OR_DEPARTMENT_OTHER): Payer: Self-pay | Admitting: Urology

## 2012-10-23 LAB — POCT HEMOGLOBIN-HEMACUE: Hemoglobin: 12.9 g/dL — ABNORMAL LOW (ref 13.0–17.0)

## 2012-12-21 ENCOUNTER — Telehealth: Payer: Self-pay | Admitting: Oncology

## 2012-12-21 ENCOUNTER — Encounter: Payer: Self-pay | Admitting: Radiation Oncology

## 2012-12-21 ENCOUNTER — Ambulatory Visit
Admission: RE | Admit: 2012-12-21 | Discharge: 2012-12-21 | Disposition: A | Discharge status: Home / Self Care | Payer: Medicare Other | Source: Ambulatory Visit | Attending: Radiation Oncology | Admitting: Radiation Oncology

## 2012-12-21 VITALS — BP 169/89 | HR 73 | Temp 97.5°F | Resp 18 | Ht 69.0 in | Wt 171.3 lb

## 2012-12-21 DIAGNOSIS — C671 Malignant neoplasm of dome of bladder: Secondary | ICD-10-CM

## 2012-12-21 DIAGNOSIS — Z77090 Contact with and (suspected) exposure to asbestos: Secondary | ICD-10-CM | POA: Insufficient documentation

## 2012-12-21 DIAGNOSIS — Z86711 Personal history of pulmonary embolism: Secondary | ICD-10-CM | POA: Insufficient documentation

## 2012-12-21 DIAGNOSIS — C679 Malignant neoplasm of bladder, unspecified: Secondary | ICD-10-CM | POA: Insufficient documentation

## 2012-12-21 DIAGNOSIS — Z7982 Long term (current) use of aspirin: Secondary | ICD-10-CM | POA: Insufficient documentation

## 2012-12-21 DIAGNOSIS — Z86718 Personal history of other venous thrombosis and embolism: Secondary | ICD-10-CM | POA: Insufficient documentation

## 2012-12-21 NOTE — Telephone Encounter (Signed)
Called pt's son(Jerry) lvm to return call.

## 2012-12-21 NOTE — Progress Notes (Signed)
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 Noticed blood in urine intermittently.  Biopsies of bladder, transurethral resection on 10/20/2012  Past/Anticipated interventions by urology, if AOZ:HYQM, cystoscopy  Past/Anticipated interventions by medical oncology, if any:No  Weight changes, if any:No  Bowel/Bladder complaints, if VHQ:IONGEXB has history of infrequent urinary tract infection and bladder distention.   Nausea/Vomiting, if any:No  Pain issues, if any:  No  SAFETY ISSUES:  Prior radiation? no  Pacemaker/ICD? no  Possible current pregnancy? no  Is the patient on methotrexate? no  Current Complaints / other details: Very pleasant gentleman here with son to discuss radiation treatment option for high grade urothelial carcinoma.Lives independently at home with dog Maggie.Married 73 years, wife lives in nursing home.No complaints today.very hard of hearing.Ambulates with cane and still drives.

## 2012-12-21 NOTE — Addendum Note (Signed)
Encounter addended by: Tessa Lerner, RN on: 12/21/2012  2:57 PM<BR>     Documentation filed: Charges VN

## 2012-12-21 NOTE — Progress Notes (Signed)
Please see the Nurse Progress Note in the MD Initial Consult Encounter for this patient. 

## 2012-12-21 NOTE — Progress Notes (Signed)
[facility] Radiation Oncology NEW PATIENT EVALUATION  Name: Omar [name] MRN: [id number]  Date:   12/21/2012           DOB: Jul 15, 1915  Status: outpatient   CC: Kaleen Mask, MD  Lindaann Slough, MD , Dr. Eli Hose   REFERRING PHYSICIAN: Lindaann Slough, MD   DIAGNOSIS: High-grade urothelial carcinoma of the urinary bladder, stage undetermined   HISTORY OF PRESENT ILLNESS:  Omar [name] is a 79 y.o. male who is seen today for the courtesy Dr. Brunilda Payor for consideration of radiation therapy in the management of his high grade urothelial carcinoma of the urinary bladder. He underwent a TUR of a bladder tumor in December 2011.  He was found to have a low-grade tumor. In January 2012 he was found to have a tumor along the anterior dome and this was also low-grade although there was a focus of high-grade carcinoma. He  represented with intermittent hematuria. Cystoscopy in March of 2014 showed recurrent bladder tumor along the left lateral wall of his bladder measuring approximately 5 cm. He underwent a formal TURB by Dr. Brunilda Payor on 10/20/2012. He described a tumor along the left lateral wall of the bladder measuring approximately 4 cm. He performed a subtotal resection with difficulty secondary to his anatomy and tumor location. On review of his pathology he was found to have high-grade papillary and invasive urothelial carcinoma. Muscularis propria was not identified. His staging CT scan of the abdomen and pelvis on 12/06/2012 showed a 2.3 x 1.7 cm enhancing lesion along the left bladder dome suspicious for a primary bladder neoplasm. There is no evidence for metastatic disease in the abdomen/pelvis. His prostate is large, and he was noted to have multiple bladder diverticuli posteriorly measuring up to 4.6 cm. He denies current hematuria. He states that he does have nocturia x2-3, and urinates up to 6-7 times a day. No GI difficulties.  PREVIOUS RADIATION THERAPY:  No   PAST MEDICAL HISTORY:  has a past medical history of History of pulmonary embolus (PE); BPH (benign prostatic hypertrophy); History of urinary retention; History of transurethral destruction of bladder lesion; H/O asbestos exposure; History of DVT (deep vein thrombosis); Bladder tumor; Nocturia; Heart murmur, systolic; and Bladder cancer (10/19/79).     PAST SURGICAL HISTORY:  Past Surgical History  Procedure Laterality Date  . Repair recurrent left inguinal hernia  06-01-1999    x 2  . Transurethral resection of prostate  1999   &  11-17-2000  . Cysto/ eua/ placement suprapubic tube  01-13-2000  . Transurethral resection of bladder tumor  06-30-2010  . Transurethral resection of prostate  07-31-2010    AND BLADDER RESECTION BLADDER TUMOR  . Vena cava filter placement  APRIL 2011    INFERIOR  . Transthoracic echocardiogram  10-11-2012   DR TILLEY    CONCENTRIC LVH WITH NORMAL LVSF/ EF 55%/ GRADE I DIASTOLIC DYSFUNCTION  . Transurethral resection of bladder tumor N/A 10/20/2012    Procedure: TRANSURETHRAL RESECTION OF BLADDER TUMOR (TURBT);  Surgeon: Lindaann Slough, MD;  Location: [facility];  Service: Urology;  Laterality: N/A;  . Cystoscopy N/A 10/20/2012    Procedure: CYSTOSCOPY;  Surgeon: Lindaann Slough, MD;  Location: [facility];  Service: Urology;  Laterality: N/A;  . Cataract surgery      BILAT. EYES     FAMILY HISTORY: His father died at age 21 from peritonitis following a ruptured appendix. His mother died at age 87, unknown cause.  SOCIAL  HISTORY:  reports that he has never smoked. He does not have any smokeless tobacco history on file. He reports that he does not drink alcohol or use illicit drugs. Married, 4 children. Retired Corporate investment banker, and also worked on a farm.   ALLERGIES: Benadryl and Latex   MEDICATIONS:  Current Outpatient Prescriptions  Medication Sig Dispense Refill  . aspirin 81 MG tablet Take 81 mg by mouth  daily.      . calcium carbonate (OS-CAL) 600 MG TABS Take 600 mg by mouth 2 (two) times daily with a meal.      . CRANBERRY JUICE EXTRACT PO Take by mouth daily.      . finasteride (PROSCAR) 5 MG tablet Take 5 mg by mouth daily.      Marland Kitchen glucosamine-chondroitin 500-400 MG tablet Take 1 tablet by mouth 3 (three) times daily.      . Multiple Vitamin (MULTIVITAMIN) capsule Take 1 capsule by mouth daily.      . Omega-3 Fatty Acids (FISH OIL) 1000 MG CAPS Take by mouth daily.      . Saw Palmetto, Serenoa repens, (SAW PALMETTO PO) Take by mouth.       No current facility-administered medications for this encounter.     REVIEW OF SYSTEMS:  Pertinent items are noted in HPI.    PHYSICAL EXAM:  height is 5\' 9"  (1.753 m) and weight is 171 lb 4.8 oz (77.701 kg). His temperature is 97.5 F (36.4 C). His blood pressure is 169/89 and his pulse is 73. His respiration is 18 and oxygen saturation is 97%.   Head neck examination: There is a 1.5 x 1.0 cm scab/scar along his left mid cheek ( evaluated by his dermatologist, Dr. Mayford Knife in [place], and said to be benign according to his son). Nodes: There is no cervical or supraclavicular lymphadenopathy. Chest: Lungs clear. Heart: Regular in rhythm. Abdomen: Low abdominal scars, without masses organomegaly. Rectal examination not performed. Extremities: Trace to 1+ ankle edema.   LABORATORY DATA:  Lab Results  Component Value Date   WBC 6.2 06/30/2010   HGB 12.9* 10/20/2012   HCT 40.7 06/30/2010   MCV 91.9 06/30/2010   PLT 124* 06/30/2010   Lab Results  Component Value Date   NA 142 06/30/2010   K 3.8 06/30/2010   CL 108 06/30/2010   CO2 28 06/30/2010   Lab Results  Component Value Date   ALT 10 06/30/2010   AST 15 06/30/2010   ALKPHOS 62 06/30/2010   BILITOT 0.7 06/30/2010       IMPRESSION: High-grade urothelial carcinoma of the urinary bladder. He could have T1, T2, or T3 disease. There is no obvious adenopathy seen on his CT scan. I spoke  with Dr. Brunilda Payor, and he does not feel that he can perform a complete TURB which would also be risky in this 79 year old gentleman. Management options include a radical course of radiation therapy with curative intent recognizing that cure even with early stage disease is probably no greater than 30-40% with radiation therapy plus or minus chemosensitization with Xeloda. The alternative would be to give him split course of palliative radiation therapy which may control is local disease for one year. His son believes that he will live more than one year, and he would like to proceed with a full course of radiation therapy which could be modified to minimize treatment-related toxicity. The patient is willing to consider chemosensitization, and I took the liberty of scheduling consultation with Dr. Eli Hose. I spoke  with Dr. Clelia Croft as well. We discussed the potential acute and late toxicities of radiation therapy including worsening of his urinary frequency and urgency. Consent is signed today.   PLAN: We will proceed with simulation/treatment planning on June 16 with the idea of beginning his radiation therapy with sensitizing Xeloda on Monday, June 23.  I spent 60 minutes minutes face to face with the patient and more than 50% of that time was spent in counseling and/or coordination of care.

## 2012-12-21 NOTE — Telephone Encounter (Signed)
C/D 12/21/12 for appt. 12/22/12 °

## 2012-12-22 ENCOUNTER — Other Ambulatory Visit: Payer: Self-pay | Admitting: Oncology

## 2012-12-22 ENCOUNTER — Other Ambulatory Visit (HOSPITAL_BASED_OUTPATIENT_CLINIC_OR_DEPARTMENT_OTHER): Payer: Medicare Other | Admitting: Lab

## 2012-12-22 ENCOUNTER — Telehealth: Payer: Self-pay | Admitting: Oncology

## 2012-12-22 ENCOUNTER — Ambulatory Visit (HOSPITAL_BASED_OUTPATIENT_CLINIC_OR_DEPARTMENT_OTHER): Payer: Medicare Other | Admitting: Oncology

## 2012-12-22 ENCOUNTER — Ambulatory Visit: Payer: Medicare Other

## 2012-12-22 VITALS — BP 127/63 | HR 51 | Temp 97.1°F | Resp 18 | Ht 69.0 in | Wt 171.0 lb

## 2012-12-22 DIAGNOSIS — D649 Anemia, unspecified: Secondary | ICD-10-CM

## 2012-12-22 DIAGNOSIS — C671 Malignant neoplasm of dome of bladder: Secondary | ICD-10-CM

## 2012-12-22 LAB — COMPREHENSIVE METABOLIC PANEL (CC13)
Albumin: 2.8 g/dL — ABNORMAL LOW (ref 3.5–5.0)
Alkaline Phosphatase: 88 U/L (ref 40–150)
BUN: 16.6 mg/dL (ref 7.0–26.0)
CO2: 27 mEq/L (ref 22–29)
Calcium: 8.6 mg/dL (ref 8.4–10.4)
Chloride: 106 mEq/L (ref 98–107)
Glucose: 107 mg/dl — ABNORMAL HIGH (ref 70–99)
Potassium: 4.2 mEq/L (ref 3.5–5.1)
Sodium: 140 mEq/L (ref 136–145)
Total Protein: 5.9 g/dL — ABNORMAL LOW (ref 6.4–8.3)

## 2012-12-22 LAB — CBC WITH DIFFERENTIAL/PLATELET
Basophils Absolute: 0 10*3/uL (ref 0.0–0.1)
Eosinophils Absolute: 0.2 10*3/uL (ref 0.0–0.5)
HGB: 12.8 g/dL — ABNORMAL LOW (ref 13.0–17.1)
MCV: 86.6 fL (ref 79.3–98.0)
MONO#: 0.5 10*3/uL (ref 0.1–0.9)
NEUT#: 3.8 10*3/uL (ref 1.5–6.5)
RBC: 4.29 10*6/uL (ref 4.20–5.82)
RDW: 14.7 % — ABNORMAL HIGH (ref 11.0–14.6)
WBC: 5.7 10*3/uL (ref 4.0–10.3)
lymph#: 1.2 10*3/uL (ref 0.9–3.3)

## 2012-12-22 MED ORDER — CAPECITABINE 500 MG PO TABS: ORAL_TABLET | ORAL | Status: DC

## 2012-12-22 NOTE — Progress Notes (Signed)
Reason for Referral: Bladder Cancer.  HPI: [name] is a 79 y.o. male who is seen today for the courtesy Dr. 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 He is a nice man native of [facility] and under the care of Dr. Brunilda Payor for the surveillance for a possible bladder tumor. He underwent a TUR of a bladder tumor in December 2011. He was found to have a low-grade tumor. In January 2012 he was found to have a tumor along the anterior dome and this was also low-grade although there was a focus of high-grade carcinoma. He represented with intermittent hematuria. Cystoscopy in March of 2014 showed recurrent bladder tumor along the left lateral wall of his bladder measuring approximately 5 cm. He underwent a formal TURB by Dr. Brunilda Payor on 10/20/2012. He described a tumor along the left lateral wall of the bladder measuring approximately 4 cm. He performed a subtotal resection with difficulty secondary to his anatomy and tumor location. On review of his pathology he was found to have high-grade papillary and invasive urothelial carcinoma. Muscularis propria was not identified. His staging CT scan of the abdomen and pelvis on 12/06/2012 showed a 2.3 x 1.7 cm enhancing lesion along the left bladder dome suspicious for a primary bladder neoplasm. There is no evidence for metastatic disease in the abdomen/pelvis. His prostate is large, and he was noted to have multiple bladder diverticuli posteriorly measuring up to 4.6 cm. He denies current hematuria. He states that he does have nocturia x2-3, and urinates up to 6-7 times a day. No GI difficulties.   Clinically, despite his age continue to be very active and is able to drive short distances and continue to live independently. He ambulates without any major difficulty using a cane and had not had any falls. He drives 2 a nursing home or his wife is almost on a daily  basis. He is able to dress and feed himself and it tends to his activities of daily living.     Past Medical History  Diagnosis Date  . History of pulmonary embolus (PE)     APRIL 2011--  MULTIPLE PE'S AND DVT  . BPH (benign prostatic hypertrophy)   . History of urinary retention   . History of transurethral destruction of bladder lesion   . H/O asbestos exposure   . History of DVT (deep vein thrombosis)     APRIL 2011--  UPPER AND LOWER EXTREMITIES  . Bladder tumor     recurrent  . Nocturia   . Heart murmur, systolic     enlarged  . Bladder cancer 10/20/12    high grade papillary/invasive urothelial  :  Past Surgical History  Procedure Laterality Date  . Repair recurrent left inguinal hernia  06-01-1999    x 2  . Transurethral resection of prostate  1999   &  11-17-2000  . Cysto/ eua/ placement suprapubic tube  01-13-2000  . Transurethral resection of bladder tumor  06-30-2010  . Transurethral resection of prostate  07-31-2010    AND BLADDER RESECTION BLADDER TUMOR  . Vena cava filter placement  APRIL 2011    INFERIOR  . Transthoracic echocardiogram  10-11-2012   DR TILLEY    CONCENTRIC LVH WITH NORMAL LVSF/ EF 55%/ GRADE I DIASTOLIC DYSFUNCTION  . Transurethral resection of bladder tumor N/A 10/20/2012    Procedure: TRANSURETHRAL RESECTION OF BLADDER TUMOR (TURBT);  Surgeon: Lindaann Slough, MD;  Location: [facility]  [place];  Service: Urology;  Laterality: N/A;  . Cystoscopy N/A 10/20/2012    Procedure: CYSTOSCOPY;  Surgeon: Lindaann Slough, MD;  Location: [facility];  Service: Urology;  Laterality: N/A;  . Cataract surgery      BILAT. EYES  :  Current Outpatient Prescriptions  Medication Sig Dispense Refill  . aspirin 81 MG tablet Take 81 mg by mouth daily.      . calcium carbonate (OS-CAL) 600 MG TABS Take 600 mg by mouth 2 (two) times daily with a meal.      . CRANBERRY JUICE EXTRACT PO Take by mouth daily.      . finasteride (PROSCAR) 5 MG  tablet Take 5 mg by mouth daily.      . magnesium hydroxide (MILK OF MAGNESIA) 400 MG/5ML suspension Take 5 mLs by mouth every other day.      . Omega-3 Fatty Acids (FISH OIL) 1000 MG CAPS Take by mouth daily.      . Saw Palmetto, Serenoa repens, (SAW PALMETTO PO) Take by mouth.      . capecitabine (XELODA) 500 MG tablet One tablet twice a day Monday to Friday with radiation.  60 tablet  0  . Multiple Vitamin (MULTIVITAMIN) capsule Take 1 capsule by mouth daily.       No current facility-administered medications for this visit.     :  Allergies  Allergen Reactions  . Benadryl (Diphenhydramine Hcl) Other (See Comments)    Urinary retention  . Latex Other (See Comments)    blisters  :  No family history on file.:  History   Social History  . Marital Status: Married    Spouse Name: N/A    Number of Children: 4  . Years of Education: N/A   Occupational History  . RETIRED     [employer]   Social History Main Topics  . Smoking status: Never Smoker   . Smokeless tobacco: Not on file  . Alcohol Use: No  . Drug Use: No  . Sexually Active: Not on file   Other Topics Concern  . Not on file   Social History Narrative   Married for 73 years.drives to see wife daily and feed lunch daily   WWll 2 years   No family history of cancer/prostate  :  A comprehensive review of systems was negative.  Exam: ECOG 1 Blood pressure 127/63, pulse 51, temperature 97.1 F (36.2 C), temperature source Oral, resp. rate 18, height 5\' 9"  (1.753 m), weight 171 lb (77.565 kg). General appearance: alert and cooperative Throat: lips, mucosa, and tongue normal; teeth and gums normal Neck: no adenopathy, no carotid bruit, no JVD, supple, symmetrical, trachea midline and thyroid not enlarged, symmetric, no tenderness/mass/nodules Back: negative Resp: clear to auscultation bilaterally Chest wall: no tenderness Cardio: regular rate and rhythm, S1, S2 normal, no murmur, click, rub or  gallop GI: soft, non-tender; bowel sounds normal; no masses,  no organomegaly Extremities: extremities normal, atraumatic, no cyanosis or edema Pulses: 2+ and symmetric Skin: Skin color, texture, turgor normal. No rashes or lesions Lymph nodes: Cervical, supraclavicular, and axillary nodes normal.   Recent Labs  12/22/12 1000  WBC 5.7  HGB 12.8*  HCT 37.1*  PLT 150     Assessment and Plan:   This is a 79 year old gentleman with the following issues:  1. High-grade urothelial carcinoma of the urinary bladder. T1, T2, or  Possibly T3 disease. There is no obvious adenopathy seen on his CT scan. Per  Dr. Rutherford Guys complete TURB which would also be risky in this 79 year old gentleman. The natural course of this disease was discussed today in details with Mr. Massenburg and his son Dorene Sorrow as well as the treatment options. I do concur with Dr. Rennie Plowman evaluation that he can benefit from palliative radiation therapy for the control of his local disease. The option of adding radiosensitizing chemotherapy was discussed in today visit. The options today will include a cis-platinum or carboplatinum based regimen versus and 5-FU-based regimen with oral Xeloda. I do not think Mr. Knoth is a platinum candidate but possibly Xeloda could be a reasonable option. Risks and benefits of oral Xeloda were discussed. Complications that includes GI toxicity with nausea vomiting and diarrhea. Hand and foot syndrome complication was discussed as well as rare but potentially serious complications such as mucositis myelosuppression associated with sepsis and possible hospitalization.  I think Mr. Ellenwood spike his age would be a reasonable candidate for Xeloda and he is willing to try. We plan to start that with the beginning of the radiation on the week of June 20 13,014 and we will see him the following week to assess toxicity. Should he develop more complications from that we will certainly stop this treatment and  proceed with radiation alone.  2. Mild anemia probably due to chronic disease and malignancy may be due to mild hematuria we'll continue to monitor this at this time.  All his questions were answered today.

## 2012-12-22 NOTE — Progress Notes (Signed)
Script for xeloda given to ebony in managed care for financial assistance

## 2012-12-22 NOTE — Telephone Encounter (Signed)
Gave pt appt for lab and ML July 2014

## 2012-12-25 ENCOUNTER — Ambulatory Visit
Admission: RE | Admit: 2012-12-25 | Discharge: 2012-12-25 | Disposition: A | Discharge status: Home / Self Care | Payer: Medicare Other | Source: Ambulatory Visit | Attending: Radiation Oncology | Admitting: Radiation Oncology

## 2012-12-25 ENCOUNTER — Encounter: Payer: Self-pay | Admitting: Oncology

## 2012-12-25 DIAGNOSIS — R300 Dysuria: Secondary | ICD-10-CM | POA: Insufficient documentation

## 2012-12-25 DIAGNOSIS — R351 Nocturia: Secondary | ICD-10-CM | POA: Insufficient documentation

## 2012-12-25 DIAGNOSIS — C671 Malignant neoplasm of dome of bladder: Secondary | ICD-10-CM

## 2012-12-25 DIAGNOSIS — Z510 Encounter for antineoplastic radiation therapy: Secondary | ICD-10-CM | POA: Insufficient documentation

## 2012-12-25 DIAGNOSIS — C679 Malignant neoplasm of bladder, unspecified: Secondary | ICD-10-CM | POA: Insufficient documentation

## 2012-12-25 DIAGNOSIS — K5900 Constipation, unspecified: Secondary | ICD-10-CM | POA: Insufficient documentation

## 2012-12-25 DIAGNOSIS — R350 Frequency of micturition: Secondary | ICD-10-CM | POA: Insufficient documentation

## 2012-12-25 NOTE — Progress Notes (Signed)
Faxed xeloda prescription to [facility]. °

## 2012-12-25 NOTE — Progress Notes (Signed)
Complex CT simulation/treatment planning note: The patient was taken to the CT simulator. A VAC LOC immobilization device was constructed. He was catheterized and contrast instilled into the bladder. His normal structures were contoured including the bladder, prostate, and bowel. His bladder tumor CTV was contoured as well. He is now ready for 3-D simulation. I prescribing 4500 cGy 25 sessions utilizing 15 MV photons. I requesting daily cone beam CT setting up to his bladder dome/left lateral bladder wall. This be followed by reduced field for a further 1600 cGy in 8 sessions. He'll receive concomitant chemotherapy in this constitutes a special treatment procedure. He will have a weekly CBC to follow his blood counts.

## 2012-12-27 ENCOUNTER — Encounter: Payer: Self-pay | Admitting: Radiation Oncology

## 2012-12-27 NOTE — Progress Notes (Signed)
3-D simulation note: The patient underwent 3-D simulation for treatment to his bladder dose fine histograms were obtained for the target structures and also avoidance structures including the rectum and bladder. He was set up a 4 field technique with 15 MV photons. For unique multileaf collimators were designed to conform the field. I prescribing 4500 cGy in 25 sessions.

## 2012-12-28 ENCOUNTER — Encounter: Payer: Self-pay | Admitting: Oncology

## 2012-12-28 NOTE — Progress Notes (Signed)
Sent xeloda prescription to two pharmacies neither can fill it because medicare does not cover it for patient's diagnosis; informed MD.

## 2013-01-01 ENCOUNTER — Ambulatory Visit: Admission: RE | Admit: 2013-01-01 | Payer: Medicare Other | Source: Ambulatory Visit | Admitting: Radiation Oncology

## 2013-01-01 ENCOUNTER — Ambulatory Visit: Payer: Medicare Other | Admitting: Radiation Oncology

## 2013-01-01 ENCOUNTER — Telehealth: Payer: Self-pay | Admitting: *Deleted

## 2013-01-02 ENCOUNTER — Ambulatory Visit
Admission: RE | Admit: 2013-01-02 | Discharge: 2013-01-02 | Disposition: A | Discharge status: Home / Self Care | Payer: Medicare Other | Source: Ambulatory Visit | Attending: Radiation Oncology | Admitting: Radiation Oncology

## 2013-01-02 NOTE — Telephone Encounter (Signed)
error 

## 2013-01-03 ENCOUNTER — Ambulatory Visit: Payer: Medicare Other

## 2013-01-03 ENCOUNTER — Telehealth: Payer: Self-pay | Admitting: Medical Oncology

## 2013-01-03 NOTE — Telephone Encounter (Signed)
Son meant to call Dr Clelia Croft and got connected to his nurse.

## 2013-01-03 NOTE — Progress Notes (Signed)
    Benjiman Core, MD - RE: "Xeloda" More Detail >>      RE: "Xeloda"    Benjiman Core, MD      Sent: Tue January 02, 2013 12:51 PM    To: Marcell Barlow, RN     Due: Tue January 02, 2013 12:51 PM       Omar [name]    MRN: [id number] DOB: Oct 22, 1915     Pt Home: [phone]            Left message with patient concerning Xeloda and also left message with son to call office asap.   Message    There is no substitute. Radiation will do the job, chemo will add very little. Please let the patient know that it is ok and I will discuss with him next visit.     ----- Message -----       From: Marcell Barlow, RN       Sent: 01/02/2013  12:34 PM         To: Benjiman Core, MD    Subject: "Xeloda"                                              Call from nurse in rad onc (Samantha--[phone]) stating that patient had 1st treatment today and became very upset because he wanted to know why he was no longer receiving Xeloda.  Message from [place] states that Medicare will no longer cover Xeloda because of patient's diagnosis.  Are we going to substitute medication?

## 2013-01-04 ENCOUNTER — Ambulatory Visit
Admission: RE | Admit: 2013-01-04 | Discharge: 2013-01-04 | Disposition: A | Discharge status: Home / Self Care | Payer: Medicare Other | Source: Ambulatory Visit | Attending: Radiation Oncology | Admitting: Radiation Oncology

## 2013-01-05 ENCOUNTER — Ambulatory Visit
Admission: RE | Admit: 2013-01-05 | Discharge: 2013-01-05 | Disposition: A | Discharge status: Home / Self Care | Payer: Medicare Other | Source: Ambulatory Visit | Attending: Radiation Oncology | Admitting: Radiation Oncology

## 2013-01-08 ENCOUNTER — Ambulatory Visit
Admission: RE | Admit: 2013-01-08 | Discharge: 2013-01-08 | Disposition: A | Discharge status: Home / Self Care | Payer: Medicare Other | Source: Ambulatory Visit | Attending: Radiation Oncology | Admitting: Radiation Oncology

## 2013-01-08 ENCOUNTER — Encounter: Payer: Self-pay | Admitting: Radiation Oncology

## 2013-01-08 VITALS — BP 120/65 | HR 48 | Temp 97.8°F | Resp 20 | Wt 172.0 lb

## 2013-01-08 DIAGNOSIS — C671 Malignant neoplasm of dome of bladder: Secondary | ICD-10-CM

## 2013-01-08 NOTE — Progress Notes (Signed)
   Weekly Management Note:  outpatient Current Dose:  7.2 Gy  Projected Dose: 45 Gy  (initial fields)  Narrative:  The patient presents for routine under treatment assessment.  CBCT/MVCT images/Port film x-rays were reviewed.  The chart was checked. No new sx.  Hematuria, dysuria stable.  Sees Dr Clelia Croft this week to further discuss Xeloda denial.  Physical Findings:  weight is 172 lb (78.019 kg). His oral temperature is 97.8 F (36.6 C). His blood pressure is 120/65 and his pulse is 48. His respiration is 20.  NAD, walks with cane  CBC    Component Value Date/Time   WBC 5.7 12/22/2012 1000   WBC 6.2 06/30/2010 0930   RBC 4.29 12/22/2012 1000   RBC 4.43 06/30/2010 0930   HGB 12.8* 12/22/2012 1000   HGB 12.9* 10/20/2012 1013   HCT 37.1* 12/22/2012 1000   HCT 40.7 06/30/2010 0930   PLT 150 12/22/2012 1000   PLT 124* 06/30/2010 0930   MCV 86.6 12/22/2012 1000   MCV 91.9 06/30/2010 0930   MCH 29.9 12/22/2012 1000   MCH 32.3 06/30/2010 0930   MCHC 34.6 12/22/2012 1000   MCHC 35.1 06/30/2010 0930   RDW 14.7* 12/22/2012 1000   RDW 12.3 06/30/2010 0930   LYMPHSABS 1.2 12/22/2012 1000   LYMPHSABS 1.2 06/30/2010 0930   MONOABS 0.5 12/22/2012 1000   MONOABS 0.5 06/30/2010 0930   EOSABS 0.2 12/22/2012 1000   EOSABS 0.2 06/30/2010 0930   BASOSABS 0.0 12/22/2012 1000   BASOSABS 0.0 06/30/2010 0930    CMP     Component Value Date/Time   NA 140 12/22/2012 1000   NA 142 06/30/2010 0930   K 4.2 12/22/2012 1000   K 3.8 06/30/2010 0930   CL 106 12/22/2012 1000   CL 108 06/30/2010 0930   CO2 27 12/22/2012 1000   CO2 28 06/30/2010 0930   GLUCOSE 107* 12/22/2012 1000   GLUCOSE 111* 06/30/2010 0930   BUN 16.6 12/22/2012 1000   BUN 28* 06/30/2010 0930   CREATININE 0.9 12/22/2012 1000   CREATININE 1.08 06/30/2010 0930   CALCIUM 8.6 12/22/2012 1000   CALCIUM 8.6 06/30/2010 0930   PROT 5.9* 12/22/2012 1000   PROT 5.9* 06/30/2010 0930   ALBUMIN 2.8* 12/22/2012 1000   ALBUMIN 3.3* 06/30/2010 0930   AST 11  12/22/2012 1000   AST 15 06/30/2010 0930   ALT 7 12/22/2012 1000   ALT 10 06/30/2010 0930   ALKPHOS 88 12/22/2012 1000   ALKPHOS 62 06/30/2010 0930   BILITOT 0.46 12/22/2012 1000   BILITOT 0.7 06/30/2010 0930   GFRNONAA >60 06/30/2010 0930   GFRAA  Value: >60        The eGFR has been calculated using the MDRD equation. This calculation has not been validated in all clinical situations. eGFR's persistently <60 mL/min signify possible Chronic Kidney Disease. 06/30/2010 0930     Impression:  The patient is tolerating radiotherapy.  Plan:  Continue radiotherapy as planned.  ________________________________   Lonie Peak, M.D.

## 2013-01-08 NOTE — Progress Notes (Signed)
Weekly rad txs, 4/25 bladder, alert,oriented x3, not taking Xeloda yet, medicare declined, son to speak with med onc with help finances, hematuria a couple days ago, dysuria slight the other day, takes mom prn keep bowels regular, good appetite and energy stated, 12:25 PM

## 2013-01-09 ENCOUNTER — Ambulatory Visit
Admission: RE | Admit: 2013-01-09 | Discharge: 2013-01-09 | Disposition: A | Discharge status: Home / Self Care | Payer: Medicare Other | Source: Ambulatory Visit | Attending: Radiation Oncology | Admitting: Radiation Oncology

## 2013-01-10 ENCOUNTER — Telehealth: Payer: Self-pay | Admitting: *Deleted

## 2013-01-10 ENCOUNTER — Ambulatory Visit (HOSPITAL_BASED_OUTPATIENT_CLINIC_OR_DEPARTMENT_OTHER): Payer: Medicare Other | Admitting: Oncology

## 2013-01-10 ENCOUNTER — Encounter: Payer: Self-pay | Admitting: Oncology

## 2013-01-10 ENCOUNTER — Other Ambulatory Visit (HOSPITAL_BASED_OUTPATIENT_CLINIC_OR_DEPARTMENT_OTHER): Payer: Medicare Other | Admitting: Lab

## 2013-01-10 ENCOUNTER — Ambulatory Visit
Admission: RE | Admit: 2013-01-10 | Discharge: 2013-01-10 | Disposition: A | Discharge status: Home / Self Care | Payer: Medicare Other | Source: Ambulatory Visit | Attending: Radiation Oncology | Admitting: Radiation Oncology

## 2013-01-10 VITALS — BP 135/66 | HR 54 | Temp 96.7°F | Resp 20 | Ht 69.0 in | Wt 174.3 lb

## 2013-01-10 DIAGNOSIS — C671 Malignant neoplasm of dome of bladder: Secondary | ICD-10-CM

## 2013-01-10 LAB — CBC WITH DIFFERENTIAL/PLATELET
Basophils Absolute: 0 10*3/uL (ref 0.0–0.1)
Eosinophils Absolute: 0.3 10*3/uL (ref 0.0–0.5)
HCT: 36.5 % — ABNORMAL LOW (ref 38.4–49.9)
LYMPH%: 18.3 % (ref 14.0–49.0)
MCV: 87.4 fL (ref 79.3–98.0)
MONO%: 7.5 % (ref 0.0–14.0)
NEUT#: 4.1 10*3/uL (ref 1.5–6.5)
NEUT%: 68.3 % (ref 39.0–75.0)
Platelets: 160 10*3/uL (ref 140–400)
RBC: 4.18 10*6/uL — ABNORMAL LOW (ref 4.20–5.82)

## 2013-01-10 LAB — COMPREHENSIVE METABOLIC PANEL (CC13)
Alkaline Phosphatase: 87 U/L (ref 40–150)
BUN: 23.7 mg/dL (ref 7.0–26.0)
Creatinine: 0.9 mg/dL (ref 0.7–1.3)
Glucose: 151 mg/dl — ABNORMAL HIGH (ref 70–140)
Sodium: 139 mEq/L (ref 136–145)
Total Bilirubin: 0.31 mg/dL (ref 0.20–1.20)

## 2013-01-10 MED ORDER — CAPECITABINE 500 MG PO TABS: ORAL_TABLET | ORAL | Status: DC

## 2013-01-10 NOTE — Telephone Encounter (Signed)
appts made and printed...td 

## 2013-01-10 NOTE — Progress Notes (Addendum)
Hematology and Oncology Follow Up Visit  Omar [name] [id number] 05-25-1916 79 y.o. 01/10/2013 3:27 PM Omar [name], MDElkins, Omar [name], *   Principle Diagnosis: High-grade urothelial carcinoma of the urinary bladder.  Prior Therapy: He underwent a TUR of a bladder tumor in December 2011. He was found to have a low-grade tumor. In January 2012 he was found to have a tumor along the anterior dome and this was also low-grade although there was a focus of high-grade carcinoma. He represented with intermittent hematuria. Cystoscopy in March of 2014 showed recurrent bladder tumor along the left lateral wall of his bladder measuring approximately 5 cm. He underwent a formal TURB by Dr. Brunilda Payor on 10/20/2012. He described a tumor along the left lateral wall of the bladder measuring approximately 4 cm. He performed a subtotal resection with difficulty secondary to his anatomy and tumor location. On review of his pathology he was found to have high-grade papillary and invasive urothelial carcinoma. Muscularis propria was not identified. His staging CT scan of the abdomen and pelvis on 12/06/2012 showed a 2.3 x 1.7 cm enhancing lesion along the left bladder dome suspicious for a primary bladder neoplasm.  Current therapy: Started her radiation to his bladder tumor beginning on June 23 14. He was due to begin Xeloda as a radiosensitizing agent, but his insurance would not cover this medication.  Interim History:  [name] returns for routine followup with his sons. The patient is currently receiving radiation therapy to his bladder tumor. He reports pink tinged urine, but no other bleeding is noted. Denies chest pain, shortness of breath, abdominal pain. Denies nausea and vomiting. No diarrhea. He and let's without any major difficulty. He uses a cane and has not had any recent falls. He is still able to drive 18 miles to visit his wife in an assisted living facility.  Medications: I have  reviewed the patient's current medications.  Current Outpatient Prescriptions  Medication Sig Dispense Refill  . acidophilus (RISAQUAD) CAPS Take 1 capsule by mouth daily.      Marland Kitchen aspirin 81 MG tablet Take 81 mg by mouth daily.      . calcium carbonate (OS-CAL) 600 MG TABS Take 600 mg by mouth 2 (two) times daily with a meal.      . capecitabine (XELODA) 500 MG tablet One tablet twice a day Monday to Friday with radiation.  84 tablet  0  . CRANBERRY JUICE EXTRACT PO Take by mouth daily.      . finasteride (PROSCAR) 5 MG tablet Take 5 mg by mouth daily.      . magnesium hydroxide (MILK OF MAGNESIA) 400 MG/5ML suspension Take 5 mLs by mouth every other day.      . Multiple Vitamin (MULTIVITAMIN) capsule Take 1 capsule by mouth daily.      . Omega-3 Fatty Acids (FISH OIL) 1000 MG CAPS Take by mouth daily.      . Saw Palmetto, Serenoa repens, (SAW PALMETTO PO) Take by mouth.       No current facility-administered medications for this visit.     Allergies:  Allergies  Allergen Reactions  . Benadryl (Diphenhydramine Hcl) Other (See Comments)    Urinary retention  . Latex Other (See Comments)    blisters    Past Medical History, Surgical history, Social history, and Family History were reviewed and updated.  Review of Systems: Constitutional:  Negative for fever, chills, night sweats, anorexia, weight loss, pain. Cardiovascular: no chest pain or dyspnea on exertion Respiratory:  no cough, shortness of breath, or wheezing Neurological: no TIA or stroke symptoms Dermatological: negative ENT: negative Skin: Negative. Gastrointestinal: no abdominal pain, change in bowel habits, or black or bloody stools Genito-Urinary: no dysuria, trouble voiding, or hematuria Hematological and Lymphatic: negative Breast: negative for breast lumps Musculoskeletal: negative Remaining ROS negative.  Physical Exam: Blood pressure 135/66, pulse 54, temperature 96.7 F (35.9 C), temperature source Oral,  resp. rate 20, height 5\' 9"  (1.753 m), weight 174 lb 4.8 oz (79.062 kg). ECOG: 1 General appearance: alert, cooperative and no distress Head: Normocephalic, without obvious abnormality, atraumatic Neck: no adenopathy, no carotid bruit, no JVD, supple, symmetrical, trachea midline and thyroid not enlarged, symmetric, no tenderness/mass/nodules Lymph nodes: Cervical, supraclavicular, and axillary nodes normal. Heart:regular rate and rhythm, S1, S2 normal, no murmur, click, rub or gallop Lung:chest clear, no wheezing, rales, normal symmetric air entry, no tachypnea, retractions or cyanosis Abdomen: soft, non-tender, without masses or organomegaly EXT:no erythema, induration, or nodules   Lab Results: Lab Results  Component Value Date   WBC 6.0 01/10/2013   HGB 12.6* 01/10/2013   HCT 36.5* 01/10/2013   MCV 87.4 01/10/2013   PLT 160 01/10/2013     Chemistry      Component Value Date/Time   NA 139 01/10/2013 1417   NA 142 06/30/2010 0930   K 3.8 01/10/2013 1417   K 3.8 06/30/2010 0930   CL 106 12/22/2012 1000   CL 108 06/30/2010 0930   CO2 29 01/10/2013 1417   CO2 28 06/30/2010 0930   BUN 23.7 01/10/2013 1417   BUN 28* 06/30/2010 0930   CREATININE 0.9 01/10/2013 1417   CREATININE 1.08 06/30/2010 0930      Component Value Date/Time   CALCIUM 8.4 01/10/2013 1417   CALCIUM 8.6 06/30/2010 0930   ALKPHOS 87 01/10/2013 1417   ALKPHOS 62 06/30/2010 0930   AST 12 01/10/2013 1417   AST 15 06/30/2010 0930   ALT 8 01/10/2013 1417   ALT 10 06/30/2010 0930   BILITOT 0.31 01/10/2013 1417   BILITOT 0.7 06/30/2010 0930     Impression and Plan: This is a 79 year old gentleman with the following issues: 1. High grade urothelial carcinoma. Currently undergoing radiation therapy to his bladder tumor. He is tolerating this well so far. His Medicare would not cover the cost of Xeloda and therefore he has not started this. The patient's son has found payment assistance for Xeloda and would like to consider trying it. I have  given him a new prescription for Xeloda 500 mg twice a day on Monday through Friday while receiving radiation. The patient's son will think about this and will let us know on Monday, July 7 if they plan to fill this and begin this medication. 2. Mild anemia. Probably due to chronic disease and malignancy and may be due to mild hematuria. His hemoglobin is stable and no transfusion is indicated. 3. Followup. I have scheduled him back with [place] in August after his radiation is complete. However, if the patient proceeds with the Xeloda we will see him back sooner.   Clenton Pare 7/2/20143:27 PM   Patient seen and examined and his case was discussed in detail with his 2 sons are present with him today. He reports clinically he have tolerated radiation therapy without any major complications. We have not been able to get of Xeloda due to financial issues. So far he has done well without any major complications.  On physical examination today and was awake and alert did  not appear any distress her heart and lung examination with all within normal range I could not appreciate any lower extremity edema.  Laboratory data was fully reviewed today with the patient.  Impression and plan: This is a pleasant gentleman with high grade urothelial carcinoma his muscle invasive currently receiving palliative radiation therapy. I discussed with him the risks and benefits of adding different chemotherapeutic agent other than Xeloda which include platinum based therapy such as carboplatin and cisplatin versus bolus 5-FU as well. I do not think he can tolerate any these agents at this time and he expressed willingness to pay out of pocket for his low. I advised him that if he is willing to do so we will support him in support toxicities of Xeloda clearly but if you choose not to it is reasonable to proceed with radiation alone as the goal of this therapy as aggressive palliation and is not curative intent. He will think  about that decision I will let me now in the near future. Answered all his questions and his family's questions to their satisfaction today.  SHADAD,FIRAS. MD 01/10/2013

## 2013-01-11 ENCOUNTER — Ambulatory Visit
Admission: RE | Admit: 2013-01-11 | Discharge: 2013-01-11 | Disposition: A | Discharge status: Home / Self Care | Payer: Medicare Other | Source: Ambulatory Visit | Attending: Radiation Oncology | Admitting: Radiation Oncology

## 2013-01-15 ENCOUNTER — Ambulatory Visit
Admission: RE | Admit: 2013-01-15 | Discharge: 2013-01-15 | Disposition: A | Discharge status: Home / Self Care | Payer: Medicare Other | Source: Ambulatory Visit | Attending: Radiation Oncology | Admitting: Radiation Oncology

## 2013-01-15 VITALS — BP 136/63 | HR 52 | Temp 97.3°F | Ht 69.0 in | Wt 175.8 lb

## 2013-01-15 DIAGNOSIS — C671 Malignant neoplasm of dome of bladder: Secondary | ICD-10-CM

## 2013-01-15 NOTE — Progress Notes (Addendum)
Weekly Management Note:  Site: Bladder Current Dose:  1440  cGy Projected Dose: 4500  cGy followed by reduced field boost  Narrative: The patient is seen today for routine under treatment assessment. CBCT/MVCT images/port films were reviewed. The chart was reviewed.   Bladder filling satisfactory. His insurance company did not cover Xeloda and the family is trying to decide as to whether or not they want to pay a pocket for Xeloda.  Physical Examination:  Filed Vitals:   01/15/13 1234  BP: 136/63  Pulse: 52  Temp: 97.3 F (36.3 C)  .  Weight: 175 lb 12.8 oz (79.742 kg). No change.  Impression: Tolerating radiation therapy well.  Plan: Continue radiation therapy as planned.

## 2013-01-15 NOTE — Progress Notes (Signed)
Omar [name] here with his son for weekly under treat visit.  He has had 8 fractions to his bladder.  He denies pain.  He does have urinary frequency.  He gets up 3-4 times per night to urinate.  He does have blood in his urine occasionally.  He denies diarrhea and nausea.  His appetite is good.  He is also wondering what dosage of cranberry extract you recommend.  He was given the radiation therapy and you book and discussed the potential side effects of treatment including diarrhea, fatigue, skin changes and bladder changes.  He was oriented to the clinic and advised to contact nursing with any questions or concerns.

## 2013-01-15 NOTE — Addendum Note (Signed)
Encounter addended by: Maryln Gottron, MD on: 01/15/2013  1:09 PM<BR>     Documentation filed: Notes Section

## 2013-01-16 ENCOUNTER — Ambulatory Visit
Admission: RE | Admit: 2013-01-16 | Discharge: 2013-01-16 | Disposition: A | Discharge status: Home / Self Care | Payer: Medicare Other | Source: Ambulatory Visit | Attending: Radiation Oncology | Admitting: Radiation Oncology

## 2013-01-17 ENCOUNTER — Ambulatory Visit
Admission: RE | Admit: 2013-01-17 | Discharge: 2013-01-17 | Disposition: A | Discharge status: Home / Self Care | Payer: Medicare Other | Source: Ambulatory Visit | Attending: Radiation Oncology | Admitting: Radiation Oncology

## 2013-01-18 ENCOUNTER — Ambulatory Visit
Admission: RE | Admit: 2013-01-18 | Discharge: 2013-01-18 | Disposition: A | Discharge status: Home / Self Care | Payer: Medicare Other | Source: Ambulatory Visit | Attending: Radiation Oncology | Admitting: Radiation Oncology

## 2013-01-19 ENCOUNTER — Encounter: Payer: Self-pay | Admitting: *Deleted

## 2013-01-19 ENCOUNTER — Ambulatory Visit
Admission: RE | Admit: 2013-01-19 | Discharge: 2013-01-19 | Disposition: A | Discharge status: Home / Self Care | Payer: Medicare Other | Source: Ambulatory Visit | Attending: Radiation Oncology | Admitting: Radiation Oncology

## 2013-01-19 NOTE — Progress Notes (Unsigned)
Patient screened by Distress Thermometer.  Patient scored a 3 on thermometer, no referrals made at this time.

## 2013-01-22 ENCOUNTER — Ambulatory Visit
Admission: RE | Admit: 2013-01-22 | Discharge: 2013-01-22 | Disposition: A | Discharge status: Home / Self Care | Payer: Medicare Other | Source: Ambulatory Visit | Attending: Radiation Oncology | Admitting: Radiation Oncology

## 2013-01-22 VITALS — BP 125/53 | HR 57 | Temp 97.3°F | Ht 69.0 in | Wt 176.1 lb

## 2013-01-22 DIAGNOSIS — C671 Malignant neoplasm of dome of bladder: Secondary | ICD-10-CM

## 2013-01-22 NOTE — Progress Notes (Signed)
Weekly Management Note:  Site: Bladder Current Dose:  2340  cGy Projected Dose: 4500  cGy followed by boost  Narrative: The patient is seen today for routine under treatment assessment. CBCT/MVCT images/port films were reviewed. The chart was reviewed.   Bladder filling is excellent. Setup is excellent. He was not able to obtain Xeloda to his insurance company. Therefore, we are treated with radiation therapy alone.  Physical Examination:  Filed Vitals:   01/22/13 1145  BP: 125/53  Pulse: 57  Temp: 97.3 F (36.3 C)  .  Weight: 176 lb 1.6 oz (79.878 kg). No change .  Impression: Tolerating radiation therapy well.  Plan: Continue radiation therapy as planned.

## 2013-01-22 NOTE — Progress Notes (Signed)
Dessa Phi here with his son.  He has had 13 fractions to his bladder.  He denies pain and fatigue.  He has noticed an increase in urinary frequency.  He says he goes more often but with less amounts.  He gets up 4-5 times per night to urinate.  He does have occasional burning with urination.  He has noticed some blood in his urine this week.  He denies having diarrhea and nausea.

## 2013-01-23 ENCOUNTER — Ambulatory Visit
Admission: RE | Admit: 2013-01-23 | Discharge: 2013-01-23 | Disposition: A | Discharge status: Home / Self Care | Payer: Medicare Other | Source: Ambulatory Visit | Attending: Radiation Oncology | Admitting: Radiation Oncology

## 2013-01-24 ENCOUNTER — Ambulatory Visit
Admission: RE | Admit: 2013-01-24 | Discharge: 2013-01-24 | Disposition: A | Discharge status: Home / Self Care | Payer: Medicare Other | Source: Ambulatory Visit | Attending: Radiation Oncology | Admitting: Radiation Oncology

## 2013-01-25 ENCOUNTER — Ambulatory Visit
Admission: RE | Admit: 2013-01-25 | Discharge: 2013-01-25 | Disposition: A | Discharge status: Home / Self Care | Payer: Medicare Other | Source: Ambulatory Visit | Attending: Radiation Oncology | Admitting: Radiation Oncology

## 2013-01-26 ENCOUNTER — Ambulatory Visit
Admission: RE | Admit: 2013-01-26 | Discharge: 2013-01-26 | Disposition: A | Discharge status: Home / Self Care | Payer: Medicare Other | Source: Ambulatory Visit | Attending: Radiation Oncology | Admitting: Radiation Oncology

## 2013-01-29 ENCOUNTER — Encounter: Payer: Self-pay | Admitting: Radiation Oncology

## 2013-01-29 ENCOUNTER — Ambulatory Visit
Admission: RE | Admit: 2013-01-29 | Discharge: 2013-01-29 | Disposition: A | Discharge status: Home / Self Care | Payer: Medicare Other | Source: Ambulatory Visit | Attending: Radiation Oncology | Admitting: Radiation Oncology

## 2013-01-29 ENCOUNTER — Other Ambulatory Visit: Payer: Self-pay | Admitting: Radiation Oncology

## 2013-01-29 ENCOUNTER — Ambulatory Visit: Admission: RE | Admit: 2013-01-29 | Payer: Medicare Other | Source: Ambulatory Visit

## 2013-01-29 VITALS — BP 140/58 | HR 57 | Resp 16 | Wt 175.3 lb

## 2013-01-29 DIAGNOSIS — C671 Malignant neoplasm of dome of bladder: Secondary | ICD-10-CM

## 2013-01-29 DIAGNOSIS — N390 Urinary tract infection, site not specified: Secondary | ICD-10-CM

## 2013-01-29 LAB — URINALYSIS, MICROSCOPIC - CHCC
Bilirubin (Urine): NEGATIVE
Glucose: NEGATIVE mg/dL
Ketones: NEGATIVE mg/dL
pH: 6 (ref 4.6–8.0)

## 2013-01-29 MED ORDER — SULFAMETHOXAZOLE-TRIMETHOPRIM 800-160 MG PO TABS: 1.0000 | ORAL_TABLET | Freq: Two times a day (BID) | ORAL | Status: DC

## 2013-01-29 NOTE — Progress Notes (Signed)
Reports increase in urinary frequency. Explains that on average he gets up 4-5 times per night to void. Reports burning and pain is associated with urination. Reports difficulty initiating the flow of urine. Reports weak urine stream. Denies diarrhea.

## 2013-01-29 NOTE — Progress Notes (Signed)
   Weekly Management Note:  outpatient Current Dose:  32.4 Gy  Projected Dose: 45 Gy +boost  Narrative:  The patient presents for routine under treatment assessment.  CBCT/MVCT images/Port film x-rays were reviewed.  The chart was checked. He reports increase in urinary frequency. Explains that on average he gets up 4-5 times per night to void. Reports burning and pain is associated with urination. Reports difficulty initiating the flow of urine. Reports weak urine stream. Denies diarrhea. He reports none of these sx are new or worse from last week.   Physical Findings:  weight is 175 lb 4.8 oz (79.516 kg). His blood pressure is 140/58 and his pulse is 57. His respiration is 16.  non toxic appearing   CBC    Component Value Date/Time   WBC 6.0 01/10/2013 1417   WBC 6.2 06/30/2010 0930   RBC 4.18* 01/10/2013 1417   RBC 4.43 06/30/2010 0930   HGB 12.6* 01/10/2013 1417   HGB 12.9* 10/20/2012 1013   HCT 36.5* 01/10/2013 1417   HCT 40.7 06/30/2010 0930   PLT 160 01/10/2013 1417   PLT 124* 06/30/2010 0930   MCV 87.4 01/10/2013 1417   MCV 91.9 06/30/2010 0930   MCH 30.1 01/10/2013 1417   MCH 32.3 06/30/2010 0930   MCHC 34.4 01/10/2013 1417   MCHC 35.1 06/30/2010 0930   RDW 14.7* 01/10/2013 1417   RDW 12.3 06/30/2010 0930   LYMPHSABS 1.1 01/10/2013 1417   LYMPHSABS 1.2 06/30/2010 0930   MONOABS 0.5 01/10/2013 1417   MONOABS 0.5 06/30/2010 0930   EOSABS 0.3 01/10/2013 1417   EOSABS 0.2 06/30/2010 0930   BASOSABS 0.0 01/10/2013 1417   BASOSABS 0.0 06/30/2010 0930     Impression:  The patient is tolerating radiotherapy.  Plan:  Continue radiotherapy as planned. Obtain UA to rule out infection  ________________________________   Lonie Peak, M.D.

## 2013-01-30 ENCOUNTER — Ambulatory Visit
Admission: RE | Admit: 2013-01-30 | Discharge: 2013-01-30 | Disposition: A | Discharge status: Home / Self Care | Payer: Medicare Other | Source: Ambulatory Visit | Attending: Radiation Oncology | Admitting: Radiation Oncology

## 2013-01-31 ENCOUNTER — Ambulatory Visit
Admission: RE | Admit: 2013-01-31 | Discharge: 2013-01-31 | Disposition: A | Discharge status: Home / Self Care | Payer: Medicare Other | Source: Ambulatory Visit | Attending: Radiation Oncology | Admitting: Radiation Oncology

## 2013-02-01 ENCOUNTER — Ambulatory Visit
Admission: RE | Admit: 2013-02-01 | Discharge: 2013-02-01 | Disposition: A | Discharge status: Home / Self Care | Payer: Medicare Other | Source: Ambulatory Visit | Attending: Radiation Oncology | Admitting: Radiation Oncology

## 2013-02-02 ENCOUNTER — Ambulatory Visit
Admission: RE | Admit: 2013-02-02 | Discharge: 2013-02-02 | Disposition: A | Discharge status: Home / Self Care | Payer: Medicare Other | Source: Ambulatory Visit | Attending: Radiation Oncology | Admitting: Radiation Oncology

## 2013-02-05 ENCOUNTER — Encounter: Payer: Self-pay | Admitting: Radiation Oncology

## 2013-02-05 ENCOUNTER — Ambulatory Visit
Admission: RE | Admit: 2013-02-05 | Discharge: 2013-02-05 | Disposition: A | Discharge status: Home / Self Care | Payer: Medicare Other | Source: Ambulatory Visit | Attending: Radiation Oncology | Admitting: Radiation Oncology

## 2013-02-05 VITALS — BP 125/63 | HR 68 | Temp 97.9°F | Resp 20 | Wt 175.0 lb

## 2013-02-05 DIAGNOSIS — C671 Malignant neoplasm of dome of bladder: Secondary | ICD-10-CM

## 2013-02-05 NOTE — Progress Notes (Signed)
Weekly Management Note:  Site: Bladder Current Dose:  4140  cGy Projected Dose: 5400  cGy  Narrative: The patient is seen today for routine under treatment assessment. CBCT/MVCT images/port films were reviewed. The chart was reviewed.   He is without complaints today. He finishes his Bactrim tomorrow. He does have nocturia x4-5. He uses milk of magnesia for constipation. Bladder filling is excellent.  Physical Examination:  Filed Vitals:   02/05/13 1126  BP: 125/63  Pulse: 68  Temp: 97.9 F (36.6 C)  Resp: 20  .  Weight: 175 lb (79.379 kg). No change.  Impression: Tolerating radiation therapy well.  Plan: Continue radiation therapy as planned.

## 2013-02-05 NOTE — Progress Notes (Signed)
Complex simulation note: The patient underwent virtual simulation for his bladder boost. I expanded his CTV by 0.5 cm to create PTV 16. He was again set up to a 3 field technique and 3 multileaf collimators were designed to conform the field. I prescribing a further 1600 cGy in 8 sessions utilizing 15 MV photons.

## 2013-02-05 NOTE — Progress Notes (Signed)
wekly rad txs, 23 comleted so far of bladder, had a few drops blood in urine 3-4 days ago stated, still taking bactrim,finishes rx tomorrow, nocturia 4-5x night, takes mom every other day to keep his bowels regular, eating well,  Some urgency when voiding  11:29 AM

## 2013-02-06 ENCOUNTER — Ambulatory Visit
Admission: RE | Admit: 2013-02-06 | Discharge: 2013-02-06 | Disposition: A | Discharge status: Home / Self Care | Payer: Medicare Other | Source: Ambulatory Visit | Attending: Radiation Oncology | Admitting: Radiation Oncology

## 2013-02-07 ENCOUNTER — Ambulatory Visit: Payer: Medicare Other

## 2013-02-07 ENCOUNTER — Ambulatory Visit
Admission: RE | Admit: 2013-02-07 | Discharge: 2013-02-07 | Disposition: A | Discharge status: Home / Self Care | Payer: Medicare Other | Source: Ambulatory Visit | Attending: Radiation Oncology | Admitting: Radiation Oncology

## 2013-02-08 ENCOUNTER — Ambulatory Visit: Payer: Medicare Other

## 2013-02-08 ENCOUNTER — Ambulatory Visit
Admission: RE | Admit: 2013-02-08 | Discharge: 2013-02-08 | Disposition: A | Discharge status: Home / Self Care | Payer: Medicare Other | Source: Ambulatory Visit | Attending: Radiation Oncology | Admitting: Radiation Oncology

## 2013-02-09 ENCOUNTER — Ambulatory Visit: Payer: Medicare Other

## 2013-02-09 ENCOUNTER — Ambulatory Visit
Admission: RE | Admit: 2013-02-09 | Discharge: 2013-02-09 | Disposition: A | Discharge status: Home / Self Care | Payer: Medicare Other | Source: Ambulatory Visit | Attending: Radiation Oncology | Admitting: Radiation Oncology

## 2013-02-12 ENCOUNTER — Ambulatory Visit: Payer: Medicare Other

## 2013-02-12 ENCOUNTER — Ambulatory Visit
Admission: RE | Admit: 2013-02-12 | Discharge: 2013-02-12 | Disposition: A | Discharge status: Home / Self Care | Payer: Medicare Other | Source: Ambulatory Visit | Attending: Radiation Oncology | Admitting: Radiation Oncology

## 2013-02-12 VITALS — BP 131/61 | HR 61 | Temp 98.1°F | Ht 69.0 in | Wt 171.6 lb

## 2013-02-12 DIAGNOSIS — C671 Malignant neoplasm of dome of bladder: Secondary | ICD-10-CM

## 2013-02-12 NOTE — Progress Notes (Signed)
Weekly Management Note:  Site: Bladder boost Current Dose:  5100  cGy Projected Dose: 6100  cGy  Narrative: The patient is seen today for routine under treatment assessment. CBCT/MVCT images/port films were reviewed. The chart was reviewed.   Bladder filling is excellent and consistent. No new GU or GI difficulties.  Physical Examination:  Filed Vitals:   02/12/13 1617  BP: 131/61  Pulse: 61  Temp: 98.1 F (36.7 C)  .  Weight: 171 lb 9.6 oz (77.837 kg). No change.  Impression: Tolerating radiation therapy well. He finishes his radiation therapy next Monday .  Plan: Continue radiation therapy as planned.

## 2013-02-12 NOTE — Progress Notes (Signed)
Omar [name] here with his son for weekly under treat visit.  He has had 28 fractions to his bladder.  He denies pain, nausea and fatigue.  He does have urinary frequency.  He does have burning at the end of urination.  He reports seeing a couple drops of blood with his urine yesterday.  He was up 2 times to urinate last night which is better that it has been.  He says he was getting up 4-5 times a night.  He is also reporting occasional difficulty with swallowing.  He states that when he is eating it feels like he needs to belch and the food comes back up.  He states that it started last week.

## 2013-02-13 ENCOUNTER — Ambulatory Visit
Admission: RE | Admit: 2013-02-13 | Discharge: 2013-02-13 | Disposition: A | Discharge status: Home / Self Care | Payer: Medicare Other | Source: Ambulatory Visit | Attending: Radiation Oncology | Admitting: Radiation Oncology

## 2013-02-13 ENCOUNTER — Ambulatory Visit: Payer: Medicare Other

## 2013-02-14 ENCOUNTER — Other Ambulatory Visit: Payer: Self-pay

## 2013-02-14 ENCOUNTER — Ambulatory Visit
Admission: RE | Admit: 2013-02-14 | Discharge: 2013-02-14 | Disposition: A | Discharge status: Home / Self Care | Payer: Medicare Other | Source: Ambulatory Visit | Attending: Radiation Oncology | Admitting: Radiation Oncology

## 2013-02-14 ENCOUNTER — Ambulatory Visit: Payer: Medicare Other

## 2013-02-15 ENCOUNTER — Ambulatory Visit
Admission: RE | Admit: 2013-02-15 | Discharge: 2013-02-15 | Disposition: A | Discharge status: Home / Self Care | Payer: Medicare Other | Source: Ambulatory Visit | Attending: Radiation Oncology | Admitting: Radiation Oncology

## 2013-02-15 ENCOUNTER — Ambulatory Visit: Payer: Medicare Other

## 2013-02-15 DIAGNOSIS — C671 Malignant neoplasm of dome of bladder: Secondary | ICD-10-CM

## 2013-02-15 NOTE — Progress Notes (Signed)
Weekly Management Note:  Site: Bladder boost Current Dose:  5700  cGy Projected Dose: 6100  cGy  Narrative: The patient is seen today for routine under treatment assessment. CBCT/MVCT images/port films were reviewed. The chart was reviewed. Bladder filling remains satisfactory.  He is without new GU or GI difficulties. He does report some urinary frequency.  Physical Examination: There were no vitals filed for this visit..  Weight:  . No change  Impression: Tolerating radiation therapy well.  Plan: Continue radiation therapy as planned. He'll finish his treatment this Monday, August 11. He'll return for a followup visit one month.

## 2013-02-16 ENCOUNTER — Ambulatory Visit: Payer: Medicare Other

## 2013-02-16 ENCOUNTER — Ambulatory Visit
Admission: RE | Admit: 2013-02-16 | Discharge: 2013-02-16 | Disposition: A | Discharge status: Home / Self Care | Payer: Medicare Other | Source: Ambulatory Visit | Attending: Radiation Oncology | Admitting: Radiation Oncology

## 2013-02-17 ENCOUNTER — Ambulatory Visit: Payer: Medicare Other

## 2013-02-19 ENCOUNTER — Ambulatory Visit: Payer: Medicare Other

## 2013-02-19 ENCOUNTER — Ambulatory Visit
Admission: RE | Admit: 2013-02-19 | Discharge: 2013-02-19 | Disposition: A | Discharge status: Home / Self Care | Payer: Medicare Other | Source: Ambulatory Visit | Attending: Radiation Oncology | Admitting: Radiation Oncology

## 2013-02-23 ENCOUNTER — Telehealth: Payer: Self-pay | Admitting: Oncology

## 2013-02-23 ENCOUNTER — Ambulatory Visit (HOSPITAL_BASED_OUTPATIENT_CLINIC_OR_DEPARTMENT_OTHER): Payer: Medicare Other | Admitting: Oncology

## 2013-02-23 ENCOUNTER — Other Ambulatory Visit (HOSPITAL_BASED_OUTPATIENT_CLINIC_OR_DEPARTMENT_OTHER): Payer: Medicare Other | Admitting: Lab

## 2013-02-23 VITALS — BP 145/50 | HR 51 | Temp 97.3°F | Resp 19 | Ht 69.0 in | Wt 173.4 lb

## 2013-02-23 DIAGNOSIS — C671 Malignant neoplasm of dome of bladder: Secondary | ICD-10-CM

## 2013-02-23 DIAGNOSIS — C679 Malignant neoplasm of bladder, unspecified: Secondary | ICD-10-CM

## 2013-02-23 LAB — CBC WITH DIFFERENTIAL/PLATELET
BASO%: 0.2 % (ref 0.0–2.0)
Eosinophils Absolute: 0.1 10*3/uL (ref 0.0–0.5)
MCHC: 33 g/dL (ref 32.0–36.0)
MCV: 88.3 fL (ref 79.3–98.0)
MONO#: 0.6 10*3/uL (ref 0.1–0.9)
MONO%: 10.5 % (ref 0.0–14.0)
NEUT#: 4.2 10*3/uL (ref 1.5–6.5)
RBC: 4.19 10*6/uL — ABNORMAL LOW (ref 4.20–5.82)
RDW: 14.3 % (ref 11.0–14.6)
WBC: 5.6 10*3/uL (ref 4.0–10.3)

## 2013-02-23 LAB — COMPREHENSIVE METABOLIC PANEL (CC13)
ALT: <6 U/L (ref 0–55)
Albumin: 2.8 g/dL — ABNORMAL LOW (ref 3.5–5.0)
Alkaline Phosphatase: 83 U/L (ref 40–150)
Glucose: 95 mg/dl (ref 70–140)
Potassium: 4.2 mEq/L (ref 3.5–5.1)
Sodium: 140 mEq/L (ref 136–145)
Total Protein: 6 g/dL — ABNORMAL LOW (ref 6.4–8.3)

## 2013-02-23 NOTE — Telephone Encounter (Signed)
gv and printed appt sched and avs for pt....gv pt barium   °

## 2013-02-23 NOTE — Progress Notes (Signed)
Hematology and Oncology Follow Up Visit  KADDEN OSTERHOUT [id number] [birth year] 79 y.o. 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   Prior Therapy: He underwent a TUR of a bladder tumor in December 2011. He was found to have a low-grade tumor. In January 2012 he was found to have a tumor along the anterior dome and this was also low-grade although there was a focus of high-grade carcinoma. He represented with intermittent hematuria. Cystoscopy in March of 2014 showed recurrent bladder tumor along the left lateral wall of his bladder measuring approximately 5 cm. He underwent a formal TURB by Dr. Brunilda Payor on 10/20/2012. He described a tumor along the left lateral wall of the bladder measuring approximately 4 cm. He performed a subtotal resection with difficulty secondary to his anatomy and tumor location. On review of his pathology he was found to have high-grade papillary and invasive urothelial carcinoma. Muscularis propria was not identified. His staging CT scan of the abdomen and pelvis on 12/06/2012 showed a 2.3 x 1.7 cm enhancing lesion along the left bladder dome suspicious for a primary bladder neoplasm.  Started her radiation to his bladder tumor beginning on June 23 14. He was due to begin Xeloda as a radiosensitizing agent, but his insurance would not cover this medication. He completed treatment on 02/19/2013.   Current therapy: Observation and follow up.   Interim History:  Mr. Moorman returns for routine followup with his son. The patient has completed radiation therapy to his bladder tumor without complications. He reports pink tinged urine, but no other bleeding is noted. Denies chest pain, shortness of breath, abdominal pain. Denies nausea and vomiting. No diarrhea. He and let's without any major difficulty. He uses a cane and has not had any recent falls. No new  illnesses or hospitalizations.   Medications: I have reviewed the patient's current medications.  Current Outpatient Prescriptions  Medication Sig Dispense Refill  . acidophilus (RISAQUAD) CAPS Take 1 capsule by mouth daily.      Marland Kitchen aspirin 81 MG tablet Take 81 mg by mouth daily.      Marland Kitchen CRANBERRY JUICE EXTRACT PO Take by mouth daily.      . finasteride (PROSCAR) 5 MG tablet Take 5 mg by mouth daily.      . magnesium hydroxide (MILK OF MAGNESIA) 400 MG/5ML suspension Take 5 mLs by mouth every other day.      . Omega-3 Fatty Acids (FISH OIL) 1000 MG CAPS Take by mouth daily.      . Saw Palmetto, Serenoa repens, (SAW PALMETTO PO) Take by mouth.      . sulfamethoxazole-trimethoprim (BACTRIM DS,SEPTRA DS) 800-160 MG per tablet Take 1 tablet by mouth 2 (two) times daily. Take for 1 week for urinary tract infection.  14 tablet  0   No current facility-administered medications for this visit.     Allergies:  Allergies  Allergen Reactions  . Benadryl [Diphenhydramine Hcl] Other (See Comments)    Urinary retention  . Latex Other (See Comments)    blisters    Past Medical History, Surgical history, Social history, and Family History were reviewed and updated.  Remaining ROS negative.  Physical Exam: Blood pressure 145/50, pulse 51, temperature 97.3 F (36.3 C), temperature source Oral, resp. rate 19, height 5\' 9"  (1.753 m), weight 173 lb 6.4 oz (78.654 kg). ECOG: 1 General appearance: alert, cooperative and no distress Head: Normocephalic, without obvious  abnormality, atraumatic Neck: no adenopathy, no carotid bruit, no JVD, supple, symmetrical, trachea midline and thyroid not enlarged, symmetric, no tenderness/mass/nodules Lymph nodes: Cervical, supraclavicular, and axillary nodes normal. Heart:regular rate and rhythm, S1, S2 normal, no murmur, click, rub or gallop Lung:chest clear, no wheezing, rales, normal symmetric air entry, no tachypnea, retractions or cyanosis Abdomen: soft,  non-tender, without masses or organomegaly EXT:no erythema, induration, or nodules   Lab Results: Lab Results  Component Value Date   WBC 5.6 02/23/2013   HGB 12.2* 02/23/2013   HCT 37.0* 02/23/2013   MCV 88.3 02/23/2013   PLT 135* 02/23/2013     Chemistry      Component Value Date/Time   NA 139 01/10/2013 1417   NA 142 06/30/2010 0930   K 3.8 01/10/2013 1417   K 3.8 06/30/2010 0930   CL 106 12/22/2012 1000   CL 108 06/30/2010 0930   CO2 29 01/10/2013 1417   CO2 28 06/30/2010 0930   BUN 23.7 01/10/2013 1417   BUN 28* 06/30/2010 0930   CREATININE 0.9 01/10/2013 1417   CREATININE 1.08 06/30/2010 0930      Component Value Date/Time   CALCIUM 8.4 01/10/2013 1417   CALCIUM 8.6 06/30/2010 0930   ALKPHOS 87 01/10/2013 1417   ALKPHOS 62 06/30/2010 0930   AST 12 01/10/2013 1417   AST 15 06/30/2010 0930   ALT 8 01/10/2013 1417   ALT 10 06/30/2010 0930   BILITOT 0.31 01/10/2013 1417   BILITOT 0.7 06/30/2010 0930     Impression and Plan: This is a 79 year old gentleman with the following issues: 1. High grade urothelial carcinoma. S/P radiation therapy to his bladder tumor. He did well without complications. The plan at this point is to continue active surveillance and I will repeat CT scans in October of 2014 and a followup at that time. I also instructed him about the importance of a followup cystoscopies with Dr. Brunilda Payor  on an intermittent basis. 2. Mild anemia. Probably due to chronic disease and malignancy and may be due to mild hematuria. His hemoglobin is stable and no transfusion is indicated. 3. Followup. I have scheduled him back in 04/2013 after a CT scan.    Keatyn Jawad 8/15/20149:43 AM

## 2013-02-24 ENCOUNTER — Encounter: Payer: Self-pay | Admitting: Radiation Oncology

## 2013-02-24 NOTE — Progress Notes (Signed)
[facility] Radiation Oncology End of Treatment Note  Name:Omar [name]  Date: 02/24/2013 ZOX:[id number] DOB:June 22, 1916   Status:outpatient    CC: Kaleen Mask, MD  Dr. Su Grand  REFERRING PHYSICIAN:  Dr. Su Grand   DIAGNOSIS:  High-grade urothelial carcinoma of the urinary bladder  INDICATION FOR TREATMENT: Curative   TREATMENT DATES: 01/02/2013 through 02/19/2013                         SITE/DOSE:  Subtotal bladder 4500 cGy 25 sessions, reduced field boost 1600 cGy in 8 sessions                          BEAMS/ENERGY:    4 field 3-D conformal therapy for the first 4500 cGy with 15 MV photon followed by reduced three-field boost for the final 1600 and her cGy in 8 sessions. He underwent daily cone beam CT for image guidance to minimize treatment-related toxicity and to improve targeting.   Thus, he received a cumulative dose of 6100 cGy in 33 sessions. We did not go beyond this dose for fear of small bowel damage along his bladder dome.           NARRATIVE: [name] tolerated his treatment beautifully with no significant cystitis or GI difficulty during his course of therapy.                           PLAN: Routine followup in one month. Patient instructed to call if questions or worsening complaints in interim.

## 2013-03-10 ENCOUNTER — Ambulatory Visit: Payer: Medicare Other

## 2013-03-12 DIAGNOSIS — C4490 Unspecified malignant neoplasm of skin, unspecified: Secondary | ICD-10-CM

## 2013-03-12 HISTORY — PX: MOHS SURGERY: SHX181

## 2013-03-12 HISTORY — DX: Unspecified malignant neoplasm of skin, unspecified: C44.90

## 2013-03-23 ENCOUNTER — Encounter: Payer: Self-pay | Admitting: Radiation Oncology

## 2013-03-28 ENCOUNTER — Ambulatory Visit
Admission: RE | Admit: 2013-03-28 | Discharge: 2013-03-28 | Disposition: A | Discharge status: Home / Self Care | Payer: Medicare Other | Source: Ambulatory Visit | Attending: Radiation Oncology | Admitting: Radiation Oncology

## 2013-03-28 ENCOUNTER — Telehealth: Payer: Self-pay | Admitting: *Deleted

## 2013-03-28 ENCOUNTER — Other Ambulatory Visit: Payer: Self-pay | Admitting: Radiation Oncology

## 2013-03-28 ENCOUNTER — Encounter: Payer: Self-pay | Admitting: Radiation Oncology

## 2013-03-28 VITALS — BP 130/58 | HR 53 | Temp 98.6°F | Resp 20 | Wt 168.3 lb

## 2013-03-28 DIAGNOSIS — C671 Malignant neoplasm of dome of bladder: Secondary | ICD-10-CM

## 2013-03-28 DIAGNOSIS — R300 Dysuria: Secondary | ICD-10-CM | POA: Insufficient documentation

## 2013-03-28 DIAGNOSIS — N390 Urinary tract infection, site not specified: Secondary | ICD-10-CM | POA: Insufficient documentation

## 2013-03-28 HISTORY — DX: Unspecified malignant neoplasm of skin, unspecified: C44.90

## 2013-03-28 LAB — URINALYSIS, MICROSCOPIC - CHCC
Bilirubin (Urine): NEGATIVE
Nitrite: NEGATIVE
Protein: 300 mg/dL
Urobilinogen, UR: 0.2 mg/dL (ref 0.2–1)
pH: 6.5 (ref 4.6–8.0)

## 2013-03-28 MED ORDER — SULFAMETHOXAZOLE-TRIMETHOPRIM 800-160 MG PO TABS: 1.0000 | ORAL_TABLET | Freq: Two times a day (BID) | ORAL | Status: DC

## 2013-03-28 NOTE — Progress Notes (Signed)
CC: Dr. 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 He still doing well, but has urinary frequency, urgency along with intermittent hematuria and dysuria. He is more symptomatic than he was when he finishes radiation therapy one month ago. He does not yet have an appointment to see Dr. Brunilda Payor for a followup visit. Of note is that he had a large excision along his left cheek for a skin cancer by Dr. Mayford Knife in [place].  Physical examination: He looks well. Filed Vitals:   03/28/13 1030  BP: 130/58  Pulse: 53  Temp: 98.6 F (37 C)  Resp: 20   Head and neck examination: There is a healing wound along his left cheek. Chest: Lungs clear. Abdomen: Soft, without masses organomegaly. There is no palpable inguinal lymphadenopathy.  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 We did make sure that he does not have a urinary tract infection, so I will obtain a urine for UA/C&S today.  Plan: He is to make an appointment to see Dr. Brunilda Payor in 1-2 months and have repeat cystoscopy  2-3 months from now. Followup visit with me in 2 months.

## 2013-03-28 NOTE — Telephone Encounter (Signed)
Called Omar [name], pt's son and informed him Dr Dayton Scrape has ordered medication for his father. Omar [name] states Dr Dayton Scrape called him. Also informed him that Dr Dayton Scrape will have pt return in 2 weeks for another UA, C&S to be sure the UTI has been resolved. informed him this office will set up the lab appointment and call him w/date and time. Son verbalized understanding.

## 2013-03-28 NOTE — Progress Notes (Signed)
Pt denies pain, loss of appetite, fatigue, bowel issues. Pt states he has had 2 episodes of "dry heaves" , last one last week. He states he feels he's had bladder infection x 2 weeks. Pt reports dysuria, occasional flecks of blood in urine, urgency, urinary frequency, nocturia. Son states he is going to call pt's PCP for appointment today.

## 2013-03-29 ENCOUNTER — Telehealth: Payer: Self-pay | Admitting: *Deleted

## 2013-03-29 NOTE — Telephone Encounter (Signed)
CALLED PATIENT TO INFORM OF LAB APPT. FOR 04-10-13 AT 10:45 AM, LVM FOR A RETURN CALL

## 2013-04-10 ENCOUNTER — Ambulatory Visit
Admission: RE | Admit: 2013-04-10 | Discharge: 2013-04-10 | Disposition: A | Discharge status: Home / Self Care | Payer: Medicare Other | Source: Ambulatory Visit | Attending: Radiation Oncology | Admitting: Radiation Oncology

## 2013-04-10 DIAGNOSIS — N390 Urinary tract infection, site not specified: Secondary | ICD-10-CM

## 2013-04-22 ENCOUNTER — Emergency Department (HOSPITAL_COMMUNITY)
Admission: EM | Admit: 2013-04-22 | Discharge: 2013-04-22 | Disposition: A | Discharge status: Home / Self Care | Payer: Medicare Other | Attending: Emergency Medicine | Admitting: Emergency Medicine

## 2013-04-22 ENCOUNTER — Encounter (HOSPITAL_COMMUNITY): Payer: Self-pay | Admitting: Emergency Medicine

## 2013-04-22 DIAGNOSIS — R011 Cardiac murmur, unspecified: Secondary | ICD-10-CM | POA: Insufficient documentation

## 2013-04-22 DIAGNOSIS — Z91040 Latex allergy status: Secondary | ICD-10-CM | POA: Insufficient documentation

## 2013-04-22 DIAGNOSIS — Z8551 Personal history of malignant neoplasm of bladder: Secondary | ICD-10-CM | POA: Insufficient documentation

## 2013-04-22 DIAGNOSIS — Z7982 Long term (current) use of aspirin: Secondary | ICD-10-CM | POA: Insufficient documentation

## 2013-04-22 DIAGNOSIS — N390 Urinary tract infection, site not specified: Secondary | ICD-10-CM | POA: Insufficient documentation

## 2013-04-22 DIAGNOSIS — Z79899 Other long term (current) drug therapy: Secondary | ICD-10-CM | POA: Insufficient documentation

## 2013-04-22 DIAGNOSIS — Z923 Personal history of irradiation: Secondary | ICD-10-CM | POA: Insufficient documentation

## 2013-04-22 DIAGNOSIS — Z87448 Personal history of other diseases of urinary system: Secondary | ICD-10-CM | POA: Insufficient documentation

## 2013-04-22 DIAGNOSIS — Z86718 Personal history of other venous thrombosis and embolism: Secondary | ICD-10-CM | POA: Insufficient documentation

## 2013-04-22 DIAGNOSIS — Z85828 Personal history of other malignant neoplasm of skin: Secondary | ICD-10-CM | POA: Insufficient documentation

## 2013-04-22 DIAGNOSIS — Z86711 Personal history of pulmonary embolism: Secondary | ICD-10-CM | POA: Insufficient documentation

## 2013-04-22 LAB — URINE MICROSCOPIC-ADD ON

## 2013-04-22 LAB — URINALYSIS, ROUTINE W REFLEX MICROSCOPIC
Glucose, UA: NEGATIVE mg/dL
Specific Gravity, Urine: 1.018 (ref 1.005–1.030)
pH: 7 (ref 5.0–8.0)

## 2013-04-22 MED ORDER — CEPHALEXIN 500 MG PO CAPS: 500.0000 mg | ORAL_CAPSULE | Freq: Four times a day (QID) | ORAL | Status: DC

## 2013-04-22 MED ORDER — CEPHALEXIN 500 MG PO CAPS
500.0000 mg | ORAL_CAPSULE | Freq: Once | ORAL | Status: AC
  Administered 2013-04-22: 500 mg via ORAL
  Filled 2013-04-22: qty 1

## 2013-04-22 NOTE — ED Notes (Signed)
Pt from home c/o urinary frequency and dysuria with incontinence starting yesterday. Pt has a history of bladder CA.

## 2013-04-22 NOTE — ED Provider Notes (Addendum)
CSN: [id number]     Arrival date & time 04/22/13  1049 History   First MD Initiated Contact with Patient 04/22/13 1126     Chief Complaint  Patient presents with  . Urinary Frequency   (Consider location/radiation/quality/duration/timing/severity/associated sxs/prior Treatment) HPI Comments: Pt seen by Dr. Eulah Pont appx 2 weeks ago for similar sx and that time found to have UTI and started on cipro with improvement and then returned yesterday  Patient is a 79 y.o. male presenting with frequency. The history is provided by the patient.  Urinary Frequency This is a recurrent problem. The current episode started yesterday. The problem occurs constantly. The problem has been gradually worsening. Pertinent negatives include no abdominal pain and no shortness of breath. Associated symptoms comments: Urinary frequency and dysuria.  No hematuria, fever, vomiting or abd pain.. Exacerbated by: urinating. Nothing relieves the symptoms. He has tried nothing for the symptoms. The treatment provided no relief.    Past Medical History  Diagnosis Date  . History of pulmonary embolus (PE)     APRIL 2011--  MULTIPLE PE'S AND DVT  . BPH (benign prostatic hypertrophy)   . History of urinary retention   . History of transurethral destruction of bladder lesion   . H/O asbestos exposure   . History of DVT (deep vein thrombosis)     APRIL 2011--  UPPER AND LOWER EXTREMITIES  . Bladder tumor     recurrent  . Nocturia   . Heart murmur, systolic     enlarged  . Bladder cancer 10/20/12    high grade papillary/invasive urothelial  . Hx of radiation therapy 01/02/13- 02/19/13    subtotal bladder 4500 cGy 25 sessions, boost 1600 cGy 8 sessions  . Skin cancer 03/2013    left cheek   Past Surgical History  Procedure Laterality Date  . Repair recurrent left inguinal hernia  06-01-1999    x 2  . Transurethral resection of prostate  1999   &  11-17-2000  . Cysto/ eua/ placement suprapubic tube  01-13-2000  .  Transurethral resection of bladder tumor  06-30-2010  . Transurethral resection of prostate  07-31-2010    AND BLADDER RESECTION BLADDER TUMOR  . Vena cava filter placement  APRIL 2011    INFERIOR  . Transthoracic echocardiogram  10-11-2012   DR TILLEY    CONCENTRIC LVH WITH NORMAL LVSF/ EF 55%/ GRADE I DIASTOLIC DYSFUNCTION  . Transurethral resection of bladder tumor N/A 10/20/2012    Procedure: TRANSURETHRAL RESECTION OF BLADDER TUMOR (TURBT);  Surgeon: Lindaann Slough, MD;  Location: [facility];  Service: Urology;  Laterality: N/A;  . Cystoscopy N/A 10/20/2012    Procedure: CYSTOSCOPY;  Surgeon: Lindaann Slough, MD;  Location: [facility];  Service: Urology;  Laterality: N/A;  . Cataract surgery      BILAT. EYES  . Mohs surgery  03/2013    left cheek   History reviewed. No pertinent family history. History  Substance Use Topics  . Smoking status: Never Smoker   . Smokeless tobacco: Not on file  . Alcohol Use: No    Review of Systems  Constitutional: Negative for fever.  Respiratory: Negative for shortness of breath.   Gastrointestinal: Negative for nausea, vomiting and abdominal pain.  Genitourinary: Positive for dysuria and frequency. Negative for hematuria, scrotal swelling and testicular pain.    Allergies  Benadryl and Latex  Home Medications   Current Outpatient Rx  Name  Route  Sig  Dispense  Refill  . acidophilus (  RISAQUAD) CAPS   Oral   Take 1 capsule by mouth daily.         Marland Kitchen aspirin 81 MG tablet   Oral   Take 81 mg by mouth daily.         Marland Kitchen CRANBERRY JUICE EXTRACT PO   Oral   Take by mouth daily.         . finasteride (PROSCAR) 5 MG tablet   Oral   Take 5 mg by mouth daily.         . Omega-3 Fatty Acids (FISH OIL) 1000 MG CAPS   Oral   Take by mouth daily.         . Saw Palmetto, Serenoa repens, (SAW PALMETTO PO)   Oral   Take 1 tablet by mouth daily.           BP 156/66  Pulse 61  Temp(Src) 97.8 F  (36.6 C) (Oral)  Resp 17  SpO2 98% Physical Exam  Nursing note and vitals reviewed. Constitutional: He is oriented to person, place, and time. He appears well-developed and well-nourished. No distress.  HENT:  Head: Normocephalic and atraumatic.  Mouth/Throat: Oropharynx is clear and moist.  Eyes: Conjunctivae and EOM are normal. Pupils are equal, round, and reactive to light.  Neck: Normal range of motion. Neck supple.  Cardiovascular: Normal rate, regular rhythm and intact distal pulses.   Murmur heard.  Systolic murmur is present with a grade of 3/6  Pulmonary/Chest: Effort normal and breath sounds normal. No respiratory distress. He has no wheezes. He has no rales.  Abdominal: Soft. He exhibits no distension. There is no tenderness. There is CVA tenderness. There is no rebound and no guarding.  Musculoskeletal: Normal range of motion. He exhibits no edema and no tenderness.  Neurological: He is alert and oriented to person, place, and time.  Skin: Skin is warm and dry. No rash noted. No erythema.  Psychiatric: He has a normal mood and affect. His behavior is normal.    ED Course  Procedures (including critical care time) Labs Review Labs Reviewed  URINALYSIS, ROUTINE W REFLEX MICROSCOPIC - Abnormal; Notable for the following:    APPearance TURBID (*)    Hgb urine dipstick LARGE (*)    Protein, ur 100 (*)    Leukocytes, UA LARGE (*)    All other components within normal limits  URINE CULTURE  URINE MICROSCOPIC-ADD ON   Imaging Review No results found.  EKG Interpretation   None       MDM   1. UTI (lower urinary tract infection)     Pt with sx consistent with UTI with similar sx about 2 weeks ago which resolved after abx.  Starting yesterday sx returned.  Pt denies any systemic sx and family affirms.  ua pending.  Benign exam.  12:11 PM Evidence of UTI and last cx sensitive to cephalosporins.  Since recently on cipro with try keflex.  Gwyneth Sprout,  MD 04/22/13 1610  Gwyneth Sprout, MD 04/22/13 1213

## 2013-04-24 ENCOUNTER — Encounter (HOSPITAL_COMMUNITY): Payer: Self-pay

## 2013-04-24 ENCOUNTER — Ambulatory Visit (HOSPITAL_COMMUNITY)
Admission: RE | Admit: 2013-04-24 | Discharge: 2013-04-24 | Disposition: A | Discharge status: Home / Self Care | Payer: Medicare Other | Source: Ambulatory Visit | Attending: Oncology | Admitting: Oncology

## 2013-04-24 ENCOUNTER — Other Ambulatory Visit: Payer: Medicare Other | Admitting: Lab

## 2013-04-24 ENCOUNTER — Other Ambulatory Visit (HOSPITAL_BASED_OUTPATIENT_CLINIC_OR_DEPARTMENT_OTHER): Payer: Medicare Other | Admitting: Lab

## 2013-04-24 DIAGNOSIS — C679 Malignant neoplasm of bladder, unspecified: Secondary | ICD-10-CM | POA: Insufficient documentation

## 2013-04-24 DIAGNOSIS — N210 Calculus in bladder: Secondary | ICD-10-CM | POA: Insufficient documentation

## 2013-04-24 DIAGNOSIS — J61 Pneumoconiosis due to asbestos and other mineral fibers: Secondary | ICD-10-CM | POA: Insufficient documentation

## 2013-04-24 DIAGNOSIS — N139 Obstructive and reflux uropathy, unspecified: Secondary | ICD-10-CM | POA: Insufficient documentation

## 2013-04-24 DIAGNOSIS — C671 Malignant neoplasm of dome of bladder: Secondary | ICD-10-CM

## 2013-04-24 DIAGNOSIS — R918 Other nonspecific abnormal finding of lung field: Secondary | ICD-10-CM | POA: Insufficient documentation

## 2013-04-24 DIAGNOSIS — N401 Enlarged prostate with lower urinary tract symptoms: Secondary | ICD-10-CM | POA: Insufficient documentation

## 2013-04-24 DIAGNOSIS — K8020 Calculus of gallbladder without cholecystitis without obstruction: Secondary | ICD-10-CM | POA: Insufficient documentation

## 2013-04-24 DIAGNOSIS — N138 Other obstructive and reflux uropathy: Secondary | ICD-10-CM | POA: Insufficient documentation

## 2013-04-24 LAB — COMPREHENSIVE METABOLIC PANEL (CC13)
AST: 13 U/L (ref 5–34)
Albumin: 2.8 g/dL — ABNORMAL LOW (ref 3.5–5.0)
BUN: 17.8 mg/dL (ref 7.0–26.0)
Calcium: 8.5 mg/dL (ref 8.4–10.4)
Chloride: 105 mEq/L (ref 98–109)
Creatinine: 0.8 mg/dL (ref 0.7–1.3)
Glucose: 103 mg/dl (ref 70–140)
Potassium: 3.9 mEq/L (ref 3.5–5.1)

## 2013-04-24 LAB — CBC WITH DIFFERENTIAL/PLATELET
Basophils Absolute: 0 10*3/uL (ref 0.0–0.1)
EOS%: 3.7 % (ref 0.0–7.0)
Eosinophils Absolute: 0.2 10*3/uL (ref 0.0–0.5)
HCT: 37.3 % — ABNORMAL LOW (ref 38.4–49.9)
HGB: 12.5 g/dL — ABNORMAL LOW (ref 13.0–17.1)
MCH: 30.7 pg (ref 27.2–33.4)
MCV: 91.8 fL (ref 79.3–98.0)
MONO%: 7.8 % (ref 0.0–14.0)
NEUT#: 3.1 10*3/uL (ref 1.5–6.5)
NEUT%: 67 % (ref 39.0–75.0)
RDW: 15.2 % — ABNORMAL HIGH (ref 11.0–14.6)
lymph#: 0.9 10*3/uL (ref 0.9–3.3)

## 2013-04-24 LAB — URINE CULTURE: Colony Count: >=100000

## 2013-04-24 MED ORDER — IOHEXOL 300 MG/ML  SOLN
100.0000 mL | Freq: Once | INTRAMUSCULAR | Status: AC | PRN
  Administered 2013-04-24: 100 mL via INTRAVENOUS

## 2013-04-25 ENCOUNTER — Telehealth: Payer: Self-pay | Admitting: Oncology

## 2013-04-25 ENCOUNTER — Telehealth (HOSPITAL_COMMUNITY): Payer: Self-pay | Admitting: *Deleted

## 2013-04-25 ENCOUNTER — Ambulatory Visit (HOSPITAL_BASED_OUTPATIENT_CLINIC_OR_DEPARTMENT_OTHER): Payer: Medicare Other | Admitting: Oncology

## 2013-04-25 VITALS — BP 131/64 | HR 64 | Temp 96.8°F | Resp 20 | Ht 69.0 in | Wt 175.9 lb

## 2013-04-25 DIAGNOSIS — D649 Anemia, unspecified: Secondary | ICD-10-CM

## 2013-04-25 DIAGNOSIS — C671 Malignant neoplasm of dome of bladder: Secondary | ICD-10-CM

## 2013-04-25 NOTE — Telephone Encounter (Signed)
Gave pt appt for lab and MD on January 2015 °

## 2013-04-25 NOTE — ED Notes (Signed)
Per Johnnette Gourd, PA-C stop taking Keflex and start taking Bactrim DS 1 tab PO BID x 7 days.  Called into [facility] at [phone] per pt's request.

## 2013-04-25 NOTE — Progress Notes (Signed)
ED Antimicrobial Stewardship Positive Culture Follow Up   Omar [name] is an 79 y.o. male who presented to [facility] on 04/22/2013 with a chief complaint of  Chief Complaint  Patient presents with  . Urinary Frequency    Recent Results (from the past 720 hour(s))  URINE CULTURE     Status: None   Collection Time    03/28/13 11:25 AM      Result Value Range Status   Urine Culture, Routine Culture, Urine   Final   Comment: Final - ===== COLONY COUNT: =====     >=100,000 COLONIES/ML     ENTEROBACTER CLOACAE          ------------------------------------------------------------------------      ENTEROBACTER CLOACAE             PIPERACILLIN/TAZO                MIC      Indeterminate     64 ug/ml        IMIPENEM                         MIC      Sensitive     <=0.25 ug/ml        CEFAZOLIN                        MIC      Resistant       >=64 ug/ml        CEFOXITIN                        MIC      Resistant       >=64 ug/ml        CEFTRIAXONE                      MIC      Sensitive        <=1 ug/ml        CEFTAZIDIME                      MIC      Sensitive        <=1 ug/ml        CEFEPIME                         MIC      Sensitive        <=1 ug/ml        GENTAMICIN                       MIC      Sensitive        <=1 ug/ml        TOBRAMYCIN                       MIC      Sensitive        <=1 ug/ml        CIPROFLOXACIN                    MIC      Sensitive     <=0.25 ug/ml        LEVOFLOXACIN  MIC      Sensitive     <=0.12 ug/ml        NITROFURANTOIN                   MIC      Resistant        128 ug/ml        TRIMETH/SULFA                    MIC      Sensitive       <=20 ug/ml     END OF REPORT  URINE CULTURE     Status: None   Collection Time    04/10/13 10:33 AM      Result Value Range Status   Urine Culture, Routine Culture, Urine   Final   Comment: Final - ===== COLONY COUNT: =====     4,000 COLONIES/ML     Insignificant Growth  URINE CULTURE     Status:  None   Collection Time    04/22/13 11:35 AM      Result Value Range Status   Specimen Description URINE, CLEAN CATCH   Final   Special Requests NONE   Final   Culture  Setup Time     Final   Value: 04/22/2013 20:54     Performed at [facility] Count     Final   Value: >=100,000 COLONIES/ML     Performed at [facility]   Culture     Final   Value: ENTEROBACTER CLOACAE     Performed at [facility]   Report Status 04/24/2013 FINAL   Final   Organism ID, Bacteria ENTEROBACTER CLOACAE   Final    [x]  Treated with Cephalexin, organism resistant to prescribed antimicrobial []  Patient discharged originally without antimicrobial agent and treatment is now indicated  New antibiotic prescription: Bactrim DS 1 tablet BID x 7 days  ED Provider: Johnnette Gourd, PA-C   Cleon Dew 04/25/2013, 4:59 PM Infectious Diseases Pharmacist Phone# [phone]

## 2013-04-25 NOTE — Progress Notes (Signed)
Hematology and Oncology Follow Up Visit  Omar [name] [id number] September 21, 1915 79 y.o. 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   Prior Therapy: He underwent a TUR of a bladder tumor in December 2011. He was found to have a low-grade tumor. In January 2012 he was found to have a tumor along the anterior dome and this was also low-grade although there was a focus of high-grade carcinoma. He represented with intermittent hematuria. Cystoscopy in March of 2014 showed recurrent bladder tumor along the left lateral wall of his bladder measuring approximately 5 cm. He underwent a formal TURB by Dr. Brunilda [name] on 10/20/2012. He described a tumor along the left lateral wall of the bladder measuring approximately 4 cm. He performed a subtotal resection with difficulty secondary to his anatomy and tumor location. On review of his pathology he was found to have high-grade papillary and invasive urothelial carcinoma. Muscularis propria was not identified. His staging CT scan of the abdomen and pelvis on 12/06/2012 showed a 2.3 x 1.7 cm enhancing lesion along the left bladder dome suspicious for a primary bladder neoplasm.  Started her radiation to his bladder tumor beginning on June 23 14. He was due to begin Xeloda as a radiosensitizing agent, but his insurance would not cover this medication. He completed treatment on 02/19/2013.   Current therapy: Observation and follow up.   Interim History:  Omar [name] returns for routine followup with his son. This is a nice man who completed radiation therapy to his bladder tumor without complications in August of 2014. He reports pink tinged urine, but no other bleeding is noted. Denies chest pain, shortness of breath, abdominal pain. Denies nausea and vomiting. No diarrhea. He and let's without any major difficulty. He uses a cane and has not had any  recent falls. No new illnesses or hospitalizations. Is not reporting any change in his performance status or activity level. He is not reporting any pulmonary symptoms of shortness of breath or difficulty breathing. He does not report any new genitourinary complaints.  Medications: I have reviewed the patient's current medications.  Current Outpatient Prescriptions  Medication Sig Dispense Refill  . acidophilus (RISAQUAD) CAPS Take 1 capsule by mouth daily.      Marland Kitchen aspirin 81 MG tablet Take 81 mg by mouth daily.      . cephALEXin (KEFLEX) 500 MG capsule Take 1 capsule (500 mg total) by mouth 4 (four) times daily.  40 capsule  0  . CRANBERRY JUICE EXTRACT PO Take by mouth daily.      . finasteride (PROSCAR) 5 MG tablet Take 5 mg by mouth daily.      . Omega-3 Fatty Acids (FISH OIL) 1000 MG CAPS Take by mouth daily.      . Saw Palmetto, Serenoa repens, (SAW PALMETTO PO) Take 1 tablet by mouth daily.        No current facility-administered medications for this visit.     Allergies:  Allergies  Allergen Reactions  . Benadryl [Diphenhydramine Hcl] Other (See Comments)    Urinary retention  . Latex Other (See Comments)    blisters    Past Medical History, Surgical history, Social history, and Family History were reviewed and updated.  Remaining ROS negative.  Physical Exam: Blood pressure 131/64, pulse 64, temperature 96.8 F (36 C), temperature source Oral, resp. rate 20, height 5\' 9"  (1.753 m), weight 175 lb 14.4 oz (79.788  kg). ECOG: 1 General appearance: alert, cooperative and no distress Head: Normocephalic, without obvious abnormality, atraumatic Neck: no adenopathy, no carotid bruit, no JVD, supple, symmetrical, trachea midline and thyroid not enlarged, symmetric, no tenderness/mass/nodules Lymph nodes: Cervical, supraclavicular, and axillary nodes normal. Heart:regular rate and rhythm, S1, S2 normal, no murmur, click, rub or gallop Lung:chest clear, no wheezing, rales, normal  symmetric air entry, no tachypnea, retractions or cyanosis Abdomen: soft, non-tender, without masses or organomegaly EXT:no erythema, induration, or nodules   Lab Results: Lab Results  Component Value Date   WBC 4.6 04/24/2013   HGB 12.5* 04/24/2013   HCT 37.3* 04/24/2013   MCV 91.8 04/24/2013   PLT 163 04/24/2013     Chemistry      Component Value Date/Time   NA 140 04/24/2013 1122   NA 142 06/30/2010 0930   K 3.9 04/24/2013 1122   K 3.8 06/30/2010 0930   CL 106 12/22/2012 1000   CL 108 06/30/2010 0930   CO2 28 04/24/2013 1122   CO2 28 06/30/2010 0930   BUN 17.8 04/24/2013 1122   BUN 28* 06/30/2010 0930   CREATININE 0.8 04/24/2013 1122   CREATININE 1.08 06/30/2010 0930      Component Value Date/Time   CALCIUM 8.5 04/24/2013 1122   CALCIUM 8.6 06/30/2010 0930   ALKPHOS 89 04/24/2013 1122   ALKPHOS 62 06/30/2010 0930   AST 13 04/24/2013 1122   AST 15 06/30/2010 0930   ALT 8 04/24/2013 1122   ALT 10 06/30/2010 0930   BILITOT 0.47 04/24/2013 1122   BILITOT 0.7 06/30/2010 0930     CLINICAL DATA: Hematuria. Recent urinary tract infection. Bladder  carcinoma.  EXAM:  CT CHEST, ABDOMEN, AND PELVIS WITH CONTRAST  TECHNIQUE:  Multidetector CT imaging of the chest, abdomen and pelvis was  performed following the standard protocol during bolus  administration of intravenous contrast.  CONTRAST: OMNIPAQUE IOHEXOL 300 MG/ML SOLN  COMPARISON: Chest CT on 10/17/2009 and AP CT on 12/06/2012  FINDINGS:  CT CHEST FINDINGS  No evidence of mediastinal or hilar masses. No lymphadenopathy  identified within the thorax. Dilated fluid-filled esophagus is  stable.  Extensive calcified pleural plaque is again seen bilaterally,  without evidence of pleural mass or effusion. Multiple small  subcentimeter noncalcified pulmonary nodules are seen bilaterally  which are new since previous study and suspicious for early  pulmonary metastases. No evidence of chest wall mass or  suspicious  bone lesions.  CT ABDOMEN AND PELVIS FINDINGS  Numerous tiny gallstones are again seen, without evidence of  cholecystitis or biliary ductal dilatation. Tiny hepatic cysts is  stable but no liver masses are identified. The pancreas, spleen,  adrenal glands, and kidneys remain normal in appearance. IVC filter  remains in place.  No soft tissue masses or lymphadenopathy identified within the  abdomen or pelvis. Markedly enlarged prostate gland is again  demonstrated with mass effect on bladder base. Diffuse bladder wall  thickening is seen without evidence of focal bladder mass. Bladder  diverticula again demonstrated, with an 8 mm calculus seen in a  left-sided Hutch diverticulum.  No evidence of inflammatory process or abnormal fluid collections.  No evidence of bowel wall thickening, dilatation, or hernia. No  suspicious bone lesions identified.  IMPRESSION:  New subcentimeter bilateral pulmonary nodules, suspicious for early  pulmonary metastases.  Stable bilateral calcified pleural plaque, consistent with asbestos  related pleural disease.  No evidence of recurrent or metastatic carcinoma within the abdomen  or pelvis.  Stable markedly  enlarged prostate and findings of chronic bladder  outlet obstruction. 8 mm bladder calculus also noted in left sided  Hutch diverticulum.  Cholelithiasis. No radiographic evidence of cholecystitis.     Impression and Plan: This is a 79 year old gentleman with the following issues: 1. High grade urothelial carcinoma. S/P radiation therapy to his bladder tumor. He did well without complications. CT scan from 04/24/2013 was discussed today and showed no evidence of clear cut cancer. He does have subcentimeter pulmonary nodules that are suspicious for metastasis but could also of a benign etiology. He is asymptomatic and we will continue  active surveillance and I will repeat CT scans in April 2015 and a followup at that time. I also  instructed him about the importance of a followup cystoscopies with Dr. Brunilda [name]  on an intermittent basis. 2. Mild anemia. Probably due to chronic disease and malignancy and may be due to mild hematuria. His hemoglobin is stable and no transfusion is indicated. 3. Followup. I have scheduled him back in January of 2015.   Cailynn Bodnar 10/15/20149:48 AM

## 2013-04-30 ENCOUNTER — Encounter (HOSPITAL_COMMUNITY): Payer: Self-pay | Admitting: Emergency Medicine

## 2013-04-30 ENCOUNTER — Emergency Department (HOSPITAL_COMMUNITY): Payer: Medicare Other

## 2013-04-30 ENCOUNTER — Inpatient Hospital Stay (HOSPITAL_COMMUNITY)
Admission: EM | Admit: 2013-04-30 | Discharge: 2013-05-04 | DRG: 481 | Disposition: A | Discharge status: Skilled Nursing Facility | Payer: Medicare Other | Attending: Internal Medicine | Admitting: Internal Medicine

## 2013-04-30 DIAGNOSIS — C671 Malignant neoplasm of dome of bladder: Secondary | ICD-10-CM | POA: Diagnosis present

## 2013-04-30 DIAGNOSIS — Z85828 Personal history of other malignant neoplasm of skin: Secondary | ICD-10-CM

## 2013-04-30 DIAGNOSIS — Z86718 Personal history of other venous thrombosis and embolism: Secondary | ICD-10-CM

## 2013-04-30 DIAGNOSIS — Z7982 Long term (current) use of aspirin: Secondary | ICD-10-CM

## 2013-04-30 DIAGNOSIS — Z923 Personal history of irradiation: Secondary | ICD-10-CM

## 2013-04-30 DIAGNOSIS — E871 Hypo-osmolality and hyponatremia: Secondary | ICD-10-CM | POA: Diagnosis not present

## 2013-04-30 DIAGNOSIS — B952 Enterococcus as the cause of diseases classified elsewhere: Secondary | ICD-10-CM | POA: Diagnosis present

## 2013-04-30 DIAGNOSIS — S42022A Displaced fracture of shaft of left clavicle, initial encounter for closed fracture: Secondary | ICD-10-CM

## 2013-04-30 DIAGNOSIS — S42009A Fracture of unspecified part of unspecified clavicle, initial encounter for closed fracture: Secondary | ICD-10-CM | POA: Diagnosis present

## 2013-04-30 DIAGNOSIS — Z86711 Personal history of pulmonary embolism: Secondary | ICD-10-CM

## 2013-04-30 DIAGNOSIS — Z66 Do not resuscitate: Secondary | ICD-10-CM | POA: Diagnosis present

## 2013-04-30 DIAGNOSIS — W010XXA Fall on same level from slipping, tripping and stumbling without subsequent striking against object, initial encounter: Secondary | ICD-10-CM | POA: Diagnosis present

## 2013-04-30 DIAGNOSIS — S72009A Fracture of unspecified part of neck of unspecified femur, initial encounter for closed fracture: Principal | ICD-10-CM

## 2013-04-30 DIAGNOSIS — D62 Acute posthemorrhagic anemia: Secondary | ICD-10-CM

## 2013-04-30 DIAGNOSIS — N400 Enlarged prostate without lower urinary tract symptoms: Secondary | ICD-10-CM | POA: Diagnosis present

## 2013-04-30 DIAGNOSIS — S42023A Displaced fracture of shaft of unspecified clavicle, initial encounter for closed fracture: Secondary | ICD-10-CM

## 2013-04-30 DIAGNOSIS — S72002A Fracture of unspecified part of neck of left femur, initial encounter for closed fracture: Secondary | ICD-10-CM

## 2013-04-30 DIAGNOSIS — Z8551 Personal history of malignant neoplasm of bladder: Secondary | ICD-10-CM

## 2013-04-30 DIAGNOSIS — Z77090 Contact with and (suspected) exposure to asbestos: Secondary | ICD-10-CM

## 2013-04-30 DIAGNOSIS — N390 Urinary tract infection, site not specified: Secondary | ICD-10-CM | POA: Diagnosis present

## 2013-04-30 DIAGNOSIS — S42002A Fracture of unspecified part of left clavicle, initial encounter for closed fracture: Secondary | ICD-10-CM

## 2013-04-30 HISTORY — DX: Other injury of unspecified body region, initial encounter: T14.8XXA

## 2013-04-30 HISTORY — DX: Pneumoconiosis due to asbestos and other mineral fibers: J61

## 2013-04-30 LAB — BASIC METABOLIC PANEL
Chloride: 102 mEq/L (ref 96–112)
GFR calc Af Amer: 64 mL/min — ABNORMAL LOW (ref >90)
GFR calc non Af Amer: 56 mL/min — ABNORMAL LOW (ref >90)
Potassium: 4 mEq/L (ref 3.5–5.1)
Sodium: 135 mEq/L (ref 135–145)

## 2013-04-30 LAB — TYPE AND SCREEN
ABO/RH(D): B POS
Antibody Screen: NEGATIVE

## 2013-04-30 LAB — CBC WITH DIFFERENTIAL/PLATELET
Basophils Absolute: 0 10*3/uL (ref 0.0–0.1)
Basophils Relative: 0 % (ref 0–1)
Eosinophils Relative: 0 % (ref 0–5)
HCT: 35.6 % — ABNORMAL LOW (ref 39.0–52.0)
Hemoglobin: 12.2 g/dL — ABNORMAL LOW (ref 13.0–17.0)
Lymphocytes Relative: 5 % — ABNORMAL LOW (ref 12–46)
MCH: 31 pg (ref 26.0–34.0)
MCHC: 34.3 g/dL (ref 30.0–36.0)
MCV: 90.6 fL (ref 78.0–100.0)
Monocytes Absolute: 0.5 10*3/uL (ref 0.1–1.0)
Monocytes Relative: 3 % (ref 3–12)
Neutro Abs: 13.2 10*3/uL — ABNORMAL HIGH (ref 1.7–7.7)
RDW: 14.1 % (ref 11.5–15.5)

## 2013-04-30 LAB — PREPARE RBC (CROSSMATCH)

## 2013-04-30 LAB — ABO/RH: ABO/RH(D): B POS

## 2013-04-30 MED ORDER — ONDANSETRON HCL 4 MG/2ML IJ SOLN: 4.0000 mg | Freq: Four times a day (QID) | INTRAMUSCULAR | Status: DC | PRN

## 2013-04-30 MED ORDER — INFLUENZA VAC SPLIT QUAD 0.5 ML IM SUSP
0.5000 mL | INTRAMUSCULAR | Status: AC
  Filled 2013-04-30: qty 0.5

## 2013-04-30 MED ORDER — DEXTROSE-NACL 5-0.45 % IV SOLN
100.0000 mL/h | INTRAVENOUS | Status: DC
  Administered 2013-04-30: 100 mL/h via INTRAVENOUS

## 2013-04-30 MED ORDER — ONDANSETRON HCL 4 MG/2ML IJ SOLN
4.0000 mg | Freq: Once | INTRAMUSCULAR | Status: AC
  Administered 2013-04-30: 4 mg via INTRAVENOUS
  Filled 2013-04-30: qty 2

## 2013-04-30 MED ORDER — ONDANSETRON HCL 4 MG/2ML IJ SOLN: 4.0000 mg | Freq: Three times a day (TID) | INTRAMUSCULAR | Status: AC | PRN

## 2013-04-30 MED ORDER — ASPIRIN 81 MG PO TABS: 81.0000 mg | ORAL_TABLET | Freq: Every day | ORAL | Status: DC

## 2013-04-30 MED ORDER — CEFAZOLIN SODIUM-DEXTROSE 2-3 GM-% IV SOLR
2.0000 g | INTRAVENOUS | Status: DC
  Filled 2013-04-30: qty 50

## 2013-04-30 MED ORDER — FENTANYL CITRATE 0.05 MG/ML IJ SOLN
50.0000 ug | INTRAMUSCULAR | Status: AC | PRN
  Administered 2013-04-30 (×2): 50 ug via INTRAVENOUS
  Filled 2013-04-30 (×2): qty 2

## 2013-04-30 MED ORDER — HYDROCODONE-ACETAMINOPHEN 5-325 MG PO TABS: 1.0000 | ORAL_TABLET | ORAL | Status: AC | PRN

## 2013-04-30 MED ORDER — ASPIRIN 81 MG PO CHEW
81.0000 mg | CHEWABLE_TABLET | Freq: Every day | ORAL | Status: DC
  Administered 2013-04-30: 81 mg via ORAL
  Filled 2013-04-30 (×2): qty 1

## 2013-04-30 MED ORDER — SULFAMETHOXAZOLE-TMP DS 800-160 MG PO TABS
1.0000 | ORAL_TABLET | Freq: Two times a day (BID) | ORAL | Status: DC
  Administered 2013-04-30 – 2013-05-04 (×8): 1 via ORAL
  Filled 2013-04-30 (×10): qty 1

## 2013-04-30 MED ORDER — DOCUSATE SODIUM 100 MG PO CAPS
100.0000 mg | ORAL_CAPSULE | Freq: Two times a day (BID) | ORAL | Status: DC
  Administered 2013-04-30 – 2013-05-04 (×7): 100 mg via ORAL
  Filled 2013-04-30 (×9): qty 1

## 2013-04-30 MED ORDER — HYDROCODONE-ACETAMINOPHEN 5-325 MG PO TABS
1.0000 | ORAL_TABLET | Freq: Four times a day (QID) | ORAL | Status: DC | PRN
  Administered 2013-05-01: 2 via ORAL
  Administered 2013-05-02 (×2): 1 via ORAL
  Filled 2013-04-30 (×2): qty 1
  Filled 2013-04-30: qty 2

## 2013-04-30 MED ORDER — MORPHINE SULFATE 2 MG/ML IJ SOLN
0.5000 mg | INTRAMUSCULAR | Status: DC | PRN
  Administered 2013-04-30: 0.5 mg via INTRAVENOUS

## 2013-04-30 MED ORDER — CHLORHEXIDINE GLUCONATE 4 % EX LIQD
60.0000 mL | Freq: Once | CUTANEOUS | Status: AC
  Administered 2013-05-01: 4 via TOPICAL
  Filled 2013-04-30 (×2): qty 60

## 2013-04-30 MED ORDER — ACETAMINOPHEN 325 MG PO TABS: 650.0000 mg | ORAL_TABLET | Freq: Four times a day (QID) | ORAL | Status: DC | PRN

## 2013-04-30 MED ORDER — FINASTERIDE 5 MG PO TABS
5.0000 mg | ORAL_TABLET | Freq: Every day | ORAL | Status: DC
  Administered 2013-04-30 – 2013-05-04 (×4): 5 mg via ORAL
  Filled 2013-04-30 (×6): qty 1

## 2013-04-30 MED ORDER — RISAQUAD PO CAPS
1.0000 | ORAL_CAPSULE | Freq: Every day | ORAL | Status: DC
  Administered 2013-04-30 – 2013-05-04 (×4): 1 via ORAL
  Filled 2013-04-30 (×6): qty 1

## 2013-04-30 MED ORDER — BISACODYL 10 MG RE SUPP
10.0000 mg | Freq: Every day | RECTAL | Status: DC | PRN
  Administered 2013-05-03: 10 mg via RECTAL
  Filled 2013-04-30: qty 1

## 2013-04-30 NOTE — ED Provider Notes (Signed)
CSN: [id number]     Arrival date & time 04/30/13  4098 History   First MD Initiated Contact with Patient 04/30/13 [phone]     Chief Complaint  Patient presents with  . Hip Injury   (Consider location/radiation/quality/duration/timing/severity/associated sxs/prior Treatment) HPI Comments: The pt is a very pleasant 79 year old male who lives by himself - states that while walking across his kitchen this AM he had a trip and fall landing on his L hip and L shoulder - this caused acute onset of pain which is constant, severe and prevented him from getting up and walking- he crawled on his back across the floor to the table where he got his cell phone and called for help - he denies headache or neck pain.  The pain is constant, mild, worse with movement and palpation and not assocaited with dizziness, light headedness, f/c/n/v or head injury / neck pain.  EMS transported without immobilization  The history is provided by the patient and the EMS personnel.    Past Medical History  Diagnosis Date  . History of pulmonary embolus (PE)     APRIL 2011--  MULTIPLE PE'S AND DVT  . BPH (benign prostatic hypertrophy)   . History of urinary retention   . History of transurethral destruction of bladder lesion   . H/O asbestos exposure   . History of DVT (deep vein thrombosis)     APRIL 2011--  UPPER AND LOWER EXTREMITIES  . Bladder tumor     recurrent  . Nocturia   . Heart murmur, systolic     enlarged  . Bladder cancer 10/20/12    high grade papillary/invasive urothelial  . Hx of radiation therapy 01/02/13- 02/19/13    subtotal bladder 4500 cGy 25 sessions, boost 1600 cGy 8 sessions  . Skin cancer 03/2013    left cheek   Past Surgical History  Procedure Laterality Date  . Repair recurrent left inguinal hernia  06-01-1999    x 2  . Transurethral resection of prostate  1999   &  11-17-2000  . Cysto/ eua/ placement suprapubic tube  01-13-2000  . Transurethral resection of bladder tumor  06-30-2010   . Transurethral resection of prostate  07-31-2010    AND BLADDER RESECTION BLADDER TUMOR  . Vena cava filter placement  APRIL 2011    INFERIOR  . Transthoracic echocardiogram  10-11-2012   DR TILLEY    CONCENTRIC LVH WITH NORMAL LVSF/ EF 55%/ GRADE I DIASTOLIC DYSFUNCTION  . Transurethral resection of bladder tumor N/A 10/20/2012    Procedure: TRANSURETHRAL RESECTION OF BLADDER TUMOR (TURBT);  Surgeon: Lindaann Slough, MD;  Location: [facility];  Service: Urology;  Laterality: N/A;  . Cystoscopy N/A 10/20/2012    Procedure: CYSTOSCOPY;  Surgeon: Lindaann Slough, MD;  Location: [facility];  Service: Urology;  Laterality: N/A;  . Cataract surgery      BILAT. EYES  . Mohs surgery  03/2013    left cheek   No family history on file. History  Substance Use Topics  . Smoking status: Never Smoker   . Smokeless tobacco: Not on file  . Alcohol Use: No    Review of Systems  All other systems reviewed and are negative.    Allergies  Benadryl and Latex  Home Medications   Current Outpatient Rx  Name  Route  Sig  Dispense  Refill  . acidophilus (RISAQUAD) CAPS   Oral   Take 1 capsule by mouth daily.         Marland Kitchen  aspirin 81 MG tablet   Oral   Take 81 mg by mouth daily.         Marland Kitchen CRANBERRY JUICE EXTRACT PO   Oral   Take by mouth daily.         . finasteride (PROSCAR) 5 MG tablet   Oral   Take 5 mg by mouth daily.         . Omega-3 Fatty Acids (FISH OIL) 1000 MG CAPS   Oral   Take by mouth daily.         . Saw Palmetto, Serenoa repens, (SAW PALMETTO PO)   Oral   Take 1 tablet by mouth daily.          Marland Kitchen sulfamethoxazole-trimethoprim (BACTRIM DS) 800-160 MG per tablet   Oral   Take 1 tablet by mouth 2 (two) times daily.          BP 127/61  Pulse 60  Temp(Src) 97.9 F (36.6 C) (Oral)  Resp 16  SpO2 92% Physical Exam  Nursing note and vitals reviewed. Constitutional: He appears well-developed and well-nourished. No distress.   HENT:  Head: Normocephalic and atraumatic.  Mouth/Throat: Oropharynx is clear and moist. No oropharyngeal exudate.  no facial tenderness, deformity, malocclusion or hemotympanum.  no battle's sign or racoon eyes.   Eyes: Conjunctivae and EOM are normal. Pupils are equal, round, and reactive to light. Right eye exhibits no discharge. Left eye exhibits no discharge. No scleral icterus.  Neck: Normal range of motion. Neck supple. No JVD present. No thyromegaly present.  Cardiovascular: Normal rate, regular rhythm and intact distal pulses.  Exam reveals no gallop and no friction rub.   Murmur ( soft systolic) heard. Pulmonary/Chest: Effort normal and breath sounds normal. No respiratory distress. He has no wheezes. He has no rales.  Abdominal: Soft. Bowel sounds are normal. He exhibits no distension and no mass. There is no tenderness.  Musculoskeletal: He exhibits tenderness. He exhibits no edema.  ttp over the medial clavicle on the L with bruising and swelling, ttp over the L hip with shortening and rotation of the LE on the L.  No other extermity injjry.  Lymphadenopathy:    He has no cervical adenopathy.  Neurological: He is alert. Coordination normal.  Sensation and motor intact to the LLe and LUE.   Skin: Skin is warm and dry. No rash noted. No erythema.  Psychiatric: He has a normal mood and affect. His behavior is normal.    ED Course  Procedures (including critical care time) Labs Review Labs Reviewed  BASIC METABOLIC PANEL - Abnormal; Notable for the following:    Glucose, Bld 165 (*)    GFR calc non Af Amer 56 (*)    GFR calc Af Amer 64 (*)    All other components within normal limits  CBC WITH DIFFERENTIAL - Abnormal; Notable for the following:    WBC 14.5 (*)    RBC 3.93 (*)    Hemoglobin 12.2 (*)    HCT 35.6 (*)    Neutrophils Relative % 92 (*)    Neutro Abs 13.2 (*)    Lymphocytes Relative 5 (*)    All other components within normal limits  PROTIME-INR  TYPE AND  SCREEN   Imaging Review Dg Chest 1 View  04/30/2013   CLINICAL DATA:  Bladder cancer. Fall with left upper chest pain.  EXAM: CHEST - 1 VIEW  COMPARISON:  None.  FINDINGS: Left medial clavicular fracture noted. Extensive bilateral pleural calcification. Borderline cardiomegaly.  Thoracic spondylosis.  No pneumothorax or pleural effusion observed.  IMPRESSION: 1. Acute left medial clavicular fracture. 2. Extensive bilateral pleural calcification. 3. Borderline cardiomegaly.   Electronically Signed   By: Herbie Baltimore M.D.   On: 04/30/2013 09:33   Dg Clavicle Left  04/30/2013   CLINICAL DATA:  Fall. Left upper chest pain. Medial clavicular swelling.  EXAM: LEFT CLAVICLE - 2+ VIEWS  COMPARISON:  None.  FINDINGS: Cortical discontinuity medially in the clavicle is compatible with fracture. The patient had a CT scan that included this region 1 week ago and I do not see an underlying lytic or destructive lesion to suggest that this fracture is a pathologic fracture.  Extensive pleural calcification on the left. Degenerative glenohumeral joint findings on the left. Mild degenerative AC joint arthropathy.  IMPRESSION: 1. Medial clavicular metaphyseal fracture on the left. 2. Calcified pleural plaques. 3. Degenerative glenohumeral arthropathy.   Electronically Signed   By: Herbie Baltimore M.D.   On: 04/30/2013 09:30   Dg Hip Complete Left  04/30/2013   CLINICAL DATA:  Traumatic injury with pain  EXAM: LEFT HIP - COMPLETE 2+ VIEW  COMPARISON:  None.  FINDINGS: There is a left femoral neck fracture with impaction and angulation at the fracture site. The pelvic ring is intact. No other focal abnormality is seen.  IMPRESSION: Left femoral neck fracture.   Electronically Signed   By: Alcide Clever M.D.   On: 04/30/2013 09:30    EKG Interpretation   None       MDM   1. Hip fracture, left, closed, initial encounter   2. Fracture closed, clavicle, shaft, left, initial encounter    Pt has possible hip frx  and clavicular fracture.  Imaging, pre op labs, consultation with surgeon and hospitalist.   D/w Dr. Eulah Pont who will see for orthopedic surgery - requests tx to [facility]  D/w Dr. Rhona Leavens who will facilitate admission to [facility] - holding orders written.    Vida Roller, MD 05/04/13 [phone]

## 2013-04-30 NOTE — Progress Notes (Signed)
Pt arrived via EMS stretcher. Moved to bed. Vitals: BP 140/62, pulse 70, temp 98.2 O2 98% on 2L [place]. MD notified of arrival. Pt oriented to unit, staff, and floor. All orders reviewed and implemented Will continue to monitor and assess.

## 2013-04-30 NOTE — ED Notes (Signed)
Pt transferred to [facility]

## 2013-04-30 NOTE — ED Notes (Signed)
Per ems: pt fell this morning in kitchen, states he tripped over feet and landed on left side. Denies LOC. C/o left hip pain. No shortening or rotation noted. Pain 3/10. Treated for UTI few weeks ago. bp 120 palpated, pulse 70, respirations 16  On assessment, left hip does have both shortening and rotation. Left clavicle swollen. Patient states he did not hit head, no LOC. Minimal tenderness to clavicle, rates hip pain 5/10

## 2013-04-30 NOTE — ED Notes (Signed)
Bed: WA04 Expected date:  Expected time:  Means of arrival:  Comments: ems- elderly, fall, hip pain, no shortening or rotation

## 2013-04-30 NOTE — Consult Note (Addendum)
ORTHOPAEDIC CONSULTATION  REQUESTING PHYSICIAN: Vida Roller, MD  Chief Complaint: Left femoral neck fracture, Left clavicle fracture  HPI: HERMENEGILDO CLAUSEN is a 79 y.o. male who complains of  Fall from standing.   Past Medical History  Diagnosis Date  . History of pulmonary embolus (PE)     APRIL 2011--  MULTIPLE PE'S AND DVT  . BPH (benign prostatic hypertrophy)   . History of urinary retention   . History of transurethral destruction of bladder lesion   . H/O asbestos exposure   . History of DVT (deep vein thrombosis)     APRIL 2011--  UPPER AND LOWER EXTREMITIES  . Bladder tumor     recurrent  . Nocturia   . Heart murmur, systolic     enlarged  . Bladder cancer 10/20/12    high grade papillary/invasive urothelial  . Hx of radiation therapy 01/02/13- 02/19/13    subtotal bladder 4500 cGy 25 sessions, boost 1600 cGy 8 sessions  . Skin cancer 03/2013    left cheek   Past Surgical History  Procedure Laterality Date  . Repair recurrent left inguinal hernia  06-01-1999    x 2  . Transurethral resection of prostate  1999   &  11-17-2000  . Cysto/ eua/ placement suprapubic tube  01-13-2000  . Transurethral resection of bladder tumor  06-30-2010  . Transurethral resection of prostate  07-31-2010    AND BLADDER RESECTION BLADDER TUMOR  . Vena cava filter placement  APRIL 2011    INFERIOR  . Transthoracic echocardiogram  10-11-2012   DR TILLEY    CONCENTRIC LVH WITH NORMAL LVSF/ EF 55%/ GRADE I DIASTOLIC DYSFUNCTION  . Transurethral resection of bladder tumor N/A 10/20/2012    Procedure: TRANSURETHRAL RESECTION OF BLADDER TUMOR (TURBT);  Surgeon: Lindaann Slough, MD;  Location: [facility];  Service: Urology;  Laterality: N/A;  . Cystoscopy N/A 10/20/2012    Procedure: CYSTOSCOPY;  Surgeon: Lindaann Slough, MD;  Location: [facility];  Service: Urology;  Laterality: N/A;  . Cataract surgery      BILAT. EYES  . Mohs surgery  03/2013    left  cheek   History   Social History  . Marital Status: Married    Spouse Name: N/A    Number of Children: 4  . Years of Education: N/A   Occupational History  . RETIRED     [employer]   Social History Main Topics  . Smoking status: Never Smoker   . Smokeless tobacco: None  . Alcohol Use: No  . Drug Use: No  . Sexual Activity: None   Other Topics Concern  . None   Social History Narrative   Married for 73 years.drives to see wife daily and feed lunch daily   WWll 2 years   No family history of cancer/prostate   No family history on file. Allergies  Allergen Reactions  . Benadryl [Diphenhydramine Hcl] Other (See Comments)    Urinary retention  . Latex Other (See Comments)    blisters   Prior to Admission medications   Medication Sig Start Date End Date Taking? Authorizing Provider  acidophilus (RISAQUAD) CAPS Take 1 capsule by mouth daily.   Yes Historical Provider, MD  aspirin 81 MG tablet Take 81 mg by mouth daily.   Yes Historical Provider, MD  CRANBERRY JUICE EXTRACT PO Take by mouth daily.   Yes Historical Provider, MD  finasteride (PROSCAR) 5 MG tablet Take 5 mg by mouth  daily.   Yes Historical Provider, MD  Omega-3 Fatty Acids (FISH OIL) 1000 MG CAPS Take by mouth daily.   Yes Historical Provider, MD  Saw Palmetto, Serenoa repens, (SAW PALMETTO PO) Take 1 tablet by mouth daily.    Yes Historical Provider, MD  sulfamethoxazole-trimethoprim (BACTRIM DS) 800-160 MG per tablet Take 1 tablet by mouth 2 (two) times daily. 04/25/13 05/02/13 Yes Historical Provider, MD   Dg Chest 1 View  04/30/2013   CLINICAL DATA:  Bladder cancer. Fall with left upper chest pain.  EXAM: CHEST - 1 VIEW  COMPARISON:  None.  FINDINGS: Left medial clavicular fracture noted. Extensive bilateral pleural calcification. Borderline cardiomegaly. Thoracic spondylosis.  No pneumothorax or pleural effusion observed.  IMPRESSION: 1. Acute left medial clavicular fracture. 2. Extensive bilateral  pleural calcification. 3. Borderline cardiomegaly.   Electronically Signed   By: Herbie Baltimore M.D.   On: 04/30/2013 09:33   Dg Clavicle Left  04/30/2013   CLINICAL DATA:  Fall. Left upper chest pain. Medial clavicular swelling.  EXAM: LEFT CLAVICLE - 2+ VIEWS  COMPARISON:  None.  FINDINGS: Cortical discontinuity medially in the clavicle is compatible with fracture. The patient had a CT scan that included this region 1 week ago and I do not see an underlying lytic or destructive lesion to suggest that this fracture is a pathologic fracture.  Extensive pleural calcification on the left. Degenerative glenohumeral joint findings on the left. Mild degenerative AC joint arthropathy.  IMPRESSION: 1. Medial clavicular metaphyseal fracture on the left. 2. Calcified pleural plaques. 3. Degenerative glenohumeral arthropathy.   Electronically Signed   By: Herbie Baltimore M.D.   On: 04/30/2013 09:30   Dg Hip Complete Left  04/30/2013   CLINICAL DATA:  Traumatic injury with pain  EXAM: LEFT HIP - COMPLETE 2+ VIEW  COMPARISON:  None.  FINDINGS: There is a left femoral neck fracture with impaction and angulation at the fracture site. The pelvic ring is intact. No other focal abnormality is seen.  IMPRESSION: Left femoral neck fracture.   Electronically Signed   By: Alcide Clever M.D.   On: 04/30/2013 09:30    Positive ROS: All other systems have been reviewed and were otherwise negative with the exception of those mentioned in the HPI and as above.  Labs cbc  Recent Labs  04/30/13 0920  WBC 14.5*  HGB 12.2*  HCT 35.6*  PLT 169    Labs inflam No results found for this basename: ESR, CRP,  in the last 72 hours  Labs coag  Recent Labs  04/30/13 0920  INR 1.02    No results found for this basename: NA, K, CL, CO2, GLUCOSE, BUN, CREATININE, CALCIUM,  in the last 72 hours  Physical Exam: Filed Vitals:   04/30/13 0838  BP: 153/65  Pulse: 63  Temp: 97.9 F (36.6 C)  Resp: 16   General:  Alert, no acute distress Cardiovascular: No pedal edema Respiratory: No cyanosis, no use of accessory musculature GI: No organomegaly, abdomen is soft and non-tender Skin: No lesions in the area of chief complaint Neurologic: Sensation intact distally Psychiatric: Patient is competent for consent with normal mood and affect Lymphatic: No axillary or cervical lymphadenopathy  MUSCULOSKELETAL:  LLE: Pain with ROM, NVI, skin benign  LUE: pain mild with ROM, no skin tenting, distally NVI Other extremities are atraumatic with painless ROM and NVI.  Assessment: Left fem neck fracture Left clavicle min displaced fx  Plan: Left hip hemiarthroplasty on 10/21 Weight Bearing Status: Bedrest  for now PT Sling for Clavicle VTE px: SCD's and Chemical px post op.    Margarita Rana, D, MD Cell [phone]   04/30/2013 9:53 AM

## 2013-04-30 NOTE — Progress Notes (Signed)
Orthopedic Tech Progress Note Patient Details:  Omar [name] October 30, 1915 [id number] Went to patient's room to apply sling immobilizer and patient had the one he was previously issued at [employer] on already. Spoke with patient's nurse and there was no need for new sling application. Also spoke with nurse about an OHF for patient and due to injury state and age it was determined that OHF was not needed at this time. Patient's condition will continue to be monitored and if frame application is appropriate Ortho Tech will be called.  Patient ID: [name], male   DOB: Jan 27, 1916, 79 y.o.   MRN: [id number]   Orie Rout 04/30/2013, 12:12 PM

## 2013-04-30 NOTE — ED Notes (Signed)
Pt transported to xray 

## 2013-04-30 NOTE — H&P (Signed)
[facility] History and Physical  Omar [name] YQM:[id number] DOB: Dec 15, 1915 DOA: 04/30/2013  Referring physician: Emergency Department PCP: Kaleen Mask, MD  Specialists:   Chief Complaint: Hip fracture  HPI: Omar [name] is a 79 y.o. male  With a hx of bladder cancer, asbestosis, prior hx of DVT with PE in 2011, completed anticoagulation and s/p IVC filter who presents to the ED with a mechanical fall while walking to get coffee on the AM of admission resulting in a L clavicular fx and L femoral neck fracture. Pt was found on the ground and brought to the ED for further work up. Pt denies syncope. Orthopedic surgery was consulted with recs for surgery on 10/21. The hospitalist was consulted for admission.  Review of Systems:  Per above, remainder of 10pt ros reviewed and are neg  Past Medical History  Diagnosis Date  . History of pulmonary embolus (PE)     APRIL 2011--  MULTIPLE PE'S AND DVT  . BPH (benign prostatic hypertrophy)   . History of urinary retention   . History of transurethral destruction of bladder lesion   . H/O asbestos exposure   . History of DVT (deep vein thrombosis)     APRIL 2011--  UPPER AND LOWER EXTREMITIES  . Bladder tumor     recurrent  . Nocturia   . Heart murmur, systolic     enlarged  . Bladder cancer 10/20/12    high grade papillary/invasive urothelial  . Hx of radiation therapy 01/02/13- 02/19/13    subtotal bladder 4500 cGy 25 sessions, boost 1600 cGy 8 sessions  . Skin cancer 03/2013    left cheek   Past Surgical History  Procedure Laterality Date  . Repair recurrent left inguinal hernia  06-01-1999    x 2  . Transurethral resection of prostate  1999   &  11-17-2000  . Cysto/ eua/ placement suprapubic tube  01-13-2000  . Transurethral resection of bladder tumor  06-30-2010  . Transurethral resection of prostate  07-31-2010    AND BLADDER RESECTION BLADDER TUMOR  . Vena cava filter placement  APRIL 2011    INFERIOR   . Transthoracic echocardiogram  10-11-2012   DR TILLEY    CONCENTRIC LVH WITH NORMAL LVSF/ EF 55%/ GRADE I DIASTOLIC DYSFUNCTION  . Transurethral resection of bladder tumor N/A 10/20/2012    Procedure: TRANSURETHRAL RESECTION OF BLADDER TUMOR (TURBT);  Surgeon: Lindaann Slough, MD;  Location: [facility];  Service: Urology;  Laterality: N/A;  . Cystoscopy N/A 10/20/2012    Procedure: CYSTOSCOPY;  Surgeon: Lindaann Slough, MD;  Location: [facility];  Service: Urology;  Laterality: N/A;  . Cataract surgery      BILAT. EYES  . Mohs surgery  03/2013    left cheek   Social History:  reports that he has never smoked. He does not have any smokeless tobacco history on file. He reports that he does not drink alcohol or use illicit drugs.  where does patient live--home, ALF, SNF? and with whom if at home?  Can patient participate in ADLs?  Allergies  Allergen Reactions  . Benadryl [Diphenhydramine Hcl] Other (See Comments)    Urinary retention  . Latex Other (See Comments)    blisters    No family history on file.  (be sure to complete)  Prior to Admission medications   Medication Sig Start Date End Date Taking? Authorizing Provider  acidophilus (RISAQUAD) CAPS Take 1 capsule by mouth daily.   Yes Historical Provider,  MD  aspirin 81 MG tablet Take 81 mg by mouth daily.   Yes Historical Provider, MD  CRANBERRY JUICE EXTRACT PO Take by mouth daily.   Yes Historical Provider, MD  finasteride (PROSCAR) 5 MG tablet Take 5 mg by mouth daily.   Yes Historical Provider, MD  Omega-3 Fatty Acids (FISH OIL) 1000 MG CAPS Take by mouth daily.   Yes Historical Provider, MD  Saw Palmetto, Serenoa repens, (SAW PALMETTO PO) Take 1 tablet by mouth daily.    Yes Historical Provider, MD  sulfamethoxazole-trimethoprim (BACTRIM DS) 800-160 MG per tablet Take 1 tablet by mouth 2 (two) times daily. 04/25/13 05/02/13 Yes Historical Provider, MD   Physical Exam: Filed Vitals:    04/30/13 0838 04/30/13 1006  BP: 153/65 127/61  Pulse: 63 60  Temp: 97.9 F (36.6 C)   TempSrc: Oral   Resp: 16 16  SpO2: 99% 92%     General:  Awake, in nad  Eyes: PERRL B  ENT: Membranes moist, dentition fiar  Neck: trachea midline, neck supple  Cardiovascular: regular, s1, s2  Respiratory: normal resp effort, no wheezing  Abdomen: soft, nondistended  Skin: normal skin turgor, no abnormal skin lesions seen  Musculoskeletal: perfused, no clubbing or cyanosis  Psychiatric: mood/affect normal // no auditory/visual hallucinations  Neurologic: cn2-12 grossly intact, strength/sensation intact  Labs on Admission:  Basic Metabolic Panel:  Recent Labs Lab 04/24/13 1122 04/30/13 0920  NA 140 135  K 3.9 4.0  CL  --  102  CO2 28 25  GLUCOSE 103 165*  BUN 17.8 13  CREATININE 0.8 1.09  CALCIUM 8.5 8.8   Liver Function Tests:  Recent Labs Lab 04/24/13 1122  AST 13  ALT 8  ALKPHOS 89  BILITOT 0.47  PROT 6.1*  ALBUMIN 2.8*   No results found for this basename: LIPASE, AMYLASE,  in the last 168 hours No results found for this basename: AMMONIA,  in the last 168 hours CBC:  Recent Labs Lab 04/24/13 1122 04/30/13 0920  WBC 4.6 14.5*  NEUTROABS 3.1 13.2*  HGB 12.5* 12.2*  HCT 37.3* 35.6*  MCV 91.8 90.6  PLT 163 169   Cardiac Enzymes: No results found for this basename: CKTOTAL, CKMB, CKMBINDEX, TROPONINI,  in the last 168 hours  BNP (last 3 results) No results found for this basename: PROBNP,  in the last 8760 hours CBG: No results found for this basename: GLUCAP,  in the last 168 hours  Radiological Exams on Admission: Dg Chest 1 View  04/30/2013   CLINICAL DATA:  Bladder cancer. Fall with left upper chest pain.  EXAM: CHEST - 1 VIEW  COMPARISON:  None.  FINDINGS: Left medial clavicular fracture noted. Extensive bilateral pleural calcification. Borderline cardiomegaly. Thoracic spondylosis.  No pneumothorax or pleural effusion observed.   IMPRESSION: 1. Acute left medial clavicular fracture. 2. Extensive bilateral pleural calcification. 3. Borderline cardiomegaly.   Electronically Signed   By: Herbie Baltimore M.D.   On: 04/30/2013 09:33   Dg Clavicle Left  04/30/2013   CLINICAL DATA:  Fall. Left upper chest pain. Medial clavicular swelling.  EXAM: LEFT CLAVICLE - 2+ VIEWS  COMPARISON:  None.  FINDINGS: Cortical discontinuity medially in the clavicle is compatible with fracture. The patient had a CT scan that included this region 1 week ago and I do not see an underlying lytic or destructive lesion to suggest that this fracture is a pathologic fracture.  Extensive pleural calcification on the left. Degenerative glenohumeral joint findings on the left. Mild  degenerative AC joint arthropathy.  IMPRESSION: 1. Medial clavicular metaphyseal fracture on the left. 2. Calcified pleural plaques. 3. Degenerative glenohumeral arthropathy.   Electronically Signed   By: Herbie Baltimore M.D.   On: 04/30/2013 09:30   Dg Hip Complete Left  04/30/2013   CLINICAL DATA:  Traumatic injury with pain  EXAM: LEFT HIP - COMPLETE 2+ VIEW  COMPARISON:  None.  FINDINGS: There is a left femoral neck fracture with impaction and angulation at the fracture site. The pelvic ring is intact. No other focal abnormality is seen.  IMPRESSION: Left femoral neck fracture.   Electronically Signed   By: Alcide Clever M.D.   On: 04/30/2013 09:30    Assessment/Plan Principal Problem:   Hip fracture Active Problems:   Cancer of dome of urinary bladder   Clavicle fracture   1. Hip fracture 1. Orthopedic surgery consulted with recs for surgery on 10/21 2. Pt to be transferred to [facility] for surgery tomorrow 3. Will admit to med-surg bed 2. Hx bladder cancer 1. Followed by Oncology 2. Stable 3. Clavicular fracture 1. Will continue with sling 2. Pain mgt as needed 4. Hx of DVT and PE 1. Pt has completed therapeutic anticoagulation and is s/p IVC filter 2. Stable 5. DVT  prophylaxis 1. SCD's for now  Code Status: DNR - confirmed with patient and family members in room Family Communication: Pt and sons in room Disposition Plan: Pending  Time spent:  CHIU, Scheryl Marten [facility] Pager [phone]  If 7PM-7AM, please contact night-coverage [email or web address] Password [email or web address] 04/30/2013, 10:13 AM

## 2013-05-01 ENCOUNTER — Inpatient Hospital Stay (HOSPITAL_COMMUNITY): Payer: Medicare Other

## 2013-05-01 ENCOUNTER — Encounter (HOSPITAL_COMMUNITY)
Admission: EM | Disposition: A | Discharge status: Skilled Nursing Facility | Payer: Self-pay | Source: Home / Self Care | Attending: Internal Medicine

## 2013-05-01 ENCOUNTER — Encounter (HOSPITAL_COMMUNITY): Payer: Medicare Other | Admitting: Anesthesiology

## 2013-05-01 ENCOUNTER — Inpatient Hospital Stay (HOSPITAL_COMMUNITY): Payer: Medicare Other | Admitting: Anesthesiology

## 2013-05-01 ENCOUNTER — Encounter (HOSPITAL_COMMUNITY): Payer: Self-pay | Admitting: Anesthesiology

## 2013-05-01 DIAGNOSIS — N390 Urinary tract infection, site not specified: Secondary | ICD-10-CM

## 2013-05-01 HISTORY — PX: HIP ARTHROPLASTY: SHX981

## 2013-05-01 LAB — CBC
HCT: 32.6 % — ABNORMAL LOW (ref 39.0–52.0)
MCH: 31.1 pg (ref 26.0–34.0)
MCHC: 34 g/dL (ref 30.0–36.0)
Platelets: 131 10*3/uL — ABNORMAL LOW (ref 150–400)
RDW: 14.2 % (ref 11.5–15.5)
WBC: 9.4 10*3/uL (ref 4.0–10.5)

## 2013-05-01 LAB — BASIC METABOLIC PANEL
BUN: 13 mg/dL (ref 6–23)
GFR calc Af Amer: 79 mL/min — ABNORMAL LOW (ref >90)
GFR calc non Af Amer: 68 mL/min — ABNORMAL LOW (ref >90)
Potassium: 4.2 mEq/L (ref 3.5–5.1)

## 2013-05-01 LAB — SURGICAL PCR SCREEN: MRSA, PCR: NEGATIVE

## 2013-05-01 SURGERY — HEMIARTHROPLASTY, HIP, DIRECT ANTERIOR APPROACH, FOR FRACTURE
Anesthesia: Spinal | Site: Hip | Laterality: Left | Wound class: Clean

## 2013-05-01 MED ORDER — CEFAZOLIN SODIUM-DEXTROSE 2-3 GM-% IV SOLR
2.0000 g | Freq: Four times a day (QID) | INTRAVENOUS | Status: AC
  Administered 2013-05-01 – 2013-05-02 (×2): 2 g via INTRAVENOUS
  Filled 2013-05-01 (×2): qty 50

## 2013-05-01 MED ORDER — FENTANYL CITRATE 0.05 MG/ML IJ SOLN: 25.0000 ug | INTRAMUSCULAR | Status: DC | PRN

## 2013-05-01 MED ORDER — CEFAZOLIN SODIUM 1-5 GM-% IV SOLN
INTRAVENOUS | Status: DC | PRN
  Administered 2013-05-01 (×2): 1 g via INTRAVENOUS

## 2013-05-01 MED ORDER — SODIUM CHLORIDE 0.9 % IV SOLN
INTRAVENOUS | Status: DC
  Administered 2013-05-01 – 2013-05-02 (×2): via INTRAVENOUS

## 2013-05-01 MED ORDER — ASPIRIN EC 325 MG PO TBEC: 325.0000 mg | DELAYED_RELEASE_TABLET | Freq: Every day | ORAL | Status: DC

## 2013-05-01 MED ORDER — 0.9 % SODIUM CHLORIDE (POUR BTL) OPTIME
TOPICAL | Status: DC | PRN
  Administered 2013-05-01: 1000 mL

## 2013-05-01 MED ORDER — PHENYLEPHRINE HCL 10 MG/ML IJ SOLN
10.0000 mg | INTRAVENOUS | Status: DC | PRN
  Administered 2013-05-01: 40 ug/min via INTRAVENOUS

## 2013-05-01 MED ORDER — MORPHINE SULFATE 2 MG/ML IJ SOLN: 0.5000 mg | INTRAMUSCULAR | Status: DC | PRN

## 2013-05-01 MED ORDER — PHENOL 1.4 % MT LIQD: 1.0000 | OROMUCOSAL | Status: DC | PRN

## 2013-05-01 MED ORDER — ASPIRIN EC 325 MG PO TBEC
325.0000 mg | DELAYED_RELEASE_TABLET | Freq: Every day | ORAL | Status: DC
  Administered 2013-05-02 – 2013-05-04 (×3): 325 mg via ORAL
  Filled 2013-05-01 (×4): qty 1

## 2013-05-01 MED ORDER — LIDOCAINE IN DEXTROSE 5-7.5 % IV SOLN
INTRAVENOUS | Status: DC | PRN
  Administered 2013-05-01: 100 mg via INTRATHECAL

## 2013-05-01 MED ORDER — DEXTROSE 5 % IV SOLN
INTRAVENOUS | Status: DC | PRN
  Administered 2013-05-01 (×2): via INTRAVENOUS

## 2013-05-01 MED ORDER — FENTANYL CITRATE 0.05 MG/ML IJ SOLN
INTRAMUSCULAR | Status: DC | PRN
  Administered 2013-05-01: 50 ug via INTRAVENOUS

## 2013-05-01 MED ORDER — CEFAZOLIN SODIUM 1-5 GM-% IV SOLN
INTRAVENOUS | Status: AC
  Filled 2013-05-01: qty 100

## 2013-05-01 MED ORDER — HYDROCODONE-ACETAMINOPHEN 5-325 MG PO TABS: 2.0000 | ORAL_TABLET | ORAL | Status: DC | PRN

## 2013-05-01 MED ORDER — LACTATED RINGERS IV SOLN
INTRAVENOUS | Status: DC | PRN
  Administered 2013-05-01 (×2): via INTRAVENOUS

## 2013-05-01 MED ORDER — MENTHOL 3 MG MT LOZG: 1.0000 | LOZENGE | OROMUCOSAL | Status: DC | PRN

## 2013-05-01 MED ORDER — EPINEPHRINE HCL 0.1 MG/ML IJ SOSY
PREFILLED_SYRINGE | INTRAMUSCULAR | Status: DC | PRN
  Administered 2013-05-01: 15 ug via SUBCUTANEOUS

## 2013-05-01 SURGICAL SUPPLY — 45 items
BLADE SAW RECIP 87.9 MT (BLADE) ×2 IMPLANT
CLOTH BEACON ORANGE TIMEOUT ST (SAFETY) ×2 IMPLANT
COVER BACK TABLE 24X17X13 BIG (DRAPES) IMPLANT
DRAPE INCISE IOBAN 85X60 (DRAPES) ×1 IMPLANT
DRAPE ORTHO SPLIT 77X108 STRL (DRAPES) ×4
DRAPE SURG ORHT 6 SPLT 77X108 (DRAPES) ×2 IMPLANT
DRAPE U-SHAPE 47X51 STRL (DRAPES) ×2 IMPLANT
DRILL BIT 7/64X5 (BIT) ×2 IMPLANT
DRSG ADAPTIC 3X8 NADH LF (GAUZE/BANDAGES/DRESSINGS) ×2 IMPLANT
DRSG MEPILEX BORDER 4X8 (GAUZE/BANDAGES/DRESSINGS) ×2 IMPLANT
DURAPREP 26ML APPLICATOR (WOUND CARE) ×3 IMPLANT
ELECT CAUTERY BLADE 6.4 (BLADE) ×2 IMPLANT
ELECT REM PT RETURN 9FT ADLT (ELECTROSURGICAL) ×2
ELECTRODE REM PT RTRN 9FT ADLT (ELECTROSURGICAL) ×1 IMPLANT
FACESHIELD LNG OPTICON STERILE (SAFETY) ×3 IMPLANT
GLOVE BIO SURGEON STRL SZ7.5 (GLOVE) IMPLANT
GLOVE BIOGEL PI IND STRL 8 (GLOVE) ×1 IMPLANT
GLOVE BIOGEL PI INDICATOR 8 (GLOVE) ×1
GLOVE SS N UNI LF 7.0 STRL (GLOVE) ×3 IMPLANT
GLOVE SURG SS PI 8.5 STRL IVOR (GLOVE) ×1
GLOVE SURG SS PI 8.5 STRL STRW (GLOVE) IMPLANT
GOWN PREVENTION PLUS XLARGE (GOWN DISPOSABLE) ×2 IMPLANT
GOWN STRL NON-REIN LRG LVL3 (GOWN DISPOSABLE) ×2 IMPLANT
HANDPIECE INTERPULSE COAX TIP (DISPOSABLE)
HIP/CERM HD VIT E LINR LEV 1C ×1 IMPLANT
KIT BASIN OR (CUSTOM PROCEDURE TRAY) ×2 IMPLANT
KIT ROOM TURNOVER OR (KITS) ×2 IMPLANT
MANIFOLD NEPTUNE II (INSTRUMENTS) ×2 IMPLANT
NS IRRIG 1000ML POUR BTL (IV SOLUTION) ×2 IMPLANT
PACK TOTAL JOINT (CUSTOM PROCEDURE TRAY) ×2 IMPLANT
PAD ARMBOARD 7.5X6 YLW CONV (MISCELLANEOUS) ×4 IMPLANT
PILLOW ABDUCTION HIP (SOFTGOODS) ×2 IMPLANT
RETRIEVER SUT HEWSON (MISCELLANEOUS) ×2 IMPLANT
SET HNDPC FAN SPRY TIP SCT (DISPOSABLE) ×1 IMPLANT
SUT FIBERWIRE #2 38 REV NDL BL (SUTURE) ×4
SUT MNCRL AB 4-0 PS2 18 (SUTURE) ×2 IMPLANT
SUT MON AB 2-0 CT1 36 (SUTURE) ×2 IMPLANT
SUT VIC AB 0 CT1 27 (SUTURE) ×2
SUT VIC AB 0 CT1 27XBRD ANBCTR (SUTURE) ×1 IMPLANT
SUTURE FIBERWR#2 38 REV NDL BL (SUTURE) ×2 IMPLANT
TOWEL OR 17X24 6PK STRL BLUE (TOWEL DISPOSABLE) ×2 IMPLANT
TOWEL OR 17X26 10 PK STRL BLUE (TOWEL DISPOSABLE) ×2 IMPLANT
TRAY FOLEY BAG SILVER LF 14FR (CATHETERS) ×1 IMPLANT
TRAY FOLEY CATH 14FR (SET/KITS/TRAYS/PACK) IMPLANT
WATER STERILE IRR 1000ML POUR (IV SOLUTION) ×4 IMPLANT

## 2013-05-01 NOTE — Anesthesia Preprocedure Evaluation (Addendum)
Anesthesia Evaluation  Patient identified by MRN, date of birth, ID band Patient awake    Reviewed: Allergy & Precautions, H&P , NPO status , Patient's Chart, lab work & pertinent test results  History of Anesthesia Complications Negative for: history of anesthetic complications  Airway Mallampati: II TM Distance: >3 FB Neck ROM: Full    Dental  (+) Teeth Intact and Dental Advisory Given   Pulmonary neg pulmonary ROS,    Pulmonary exam normal       Cardiovascular negative cardio ROS      Neuro/Psych negative neurological ROS  negative psych ROS   GI/Hepatic negative GI ROS, Neg liver ROS,   Endo/Other  negative endocrine ROS  Renal/GU negative Renal ROS     Musculoskeletal   Abdominal   Peds  Hematology   Anesthesia Other Findings   Reproductive/Obstetrics                          Anesthesia Physical Anesthesia Plan  ASA: III  Anesthesia Plan: Spinal   Post-op Pain Management:    Induction:   Airway Management Planned: Simple Face Mask  Additional Equipment:   Intra-op Plan:   Post-operative Plan:   Informed Consent: I have reviewed the patients History and Physical, chart, labs and discussed the procedure including the risks, benefits and alternatives for the proposed anesthesia with the patient or authorized representative who has indicated his/her understanding and acceptance.   Dental advisory given  Plan Discussed with: CRNA, Anesthesiologist and Surgeon  Anesthesia Plan Comments:        Anesthesia Quick Evaluation

## 2013-05-01 NOTE — Plan of Care (Signed)
Problem: Phase I Progression Outcomes Goal: Pre op-initial discharge plan identified Outcome: Completed/Met Date Met:  05/01/13 May need SNF upon discharge d/t age and lives alone.

## 2013-05-01 NOTE — Progress Notes (Signed)
[facility] PROGRESS NOTE  Omar [name] ZOX:[id number] DOB: Mar 27, 1916 DOA: 04/30/2013 PCP: Omar Mask, MD  Assessment/Plan:  Left Hip fracture  Patient at moderate to high risk for surgery, discussed with son who agree to proceed with surgery. Chest x ray with : Extensive bilateral pleural calcification.  Borderline cardiomegaly.  Hx bladder cancer  Followed by Oncology Stable  Clavicular fracture  Will continue with sling Pain mgt as needed  Hx of DVT and PE  Pt has completed therapeutic anticoagulation and is s/p IVC filter Anticoagulation post surgery per ortho.   DVT prophylaxis ; per ortho, post surgery.  UTI, enterococcus UA 10-14: Continue with Bactrim.    Code Status: DNR Family Communication: Care discussed with patient and son who was at bedside.  Disposition Plan: to be determine.    Consultants:  Dr Omar [name]  Procedures:  none  Antibiotics:  Cefazolin one dose pre op.   HPI/Subjective: Patient denies chest pain or dyspnea at rest or on exertion.   Objective: Filed Vitals:   05/01/13 0537  BP: 111/50  Pulse: 67  Temp: 98.3 F (36.8 C)  Resp: 16    Intake/Output Summary (Last 24 hours) at 05/01/13 0820 Last data filed at 05/01/13 0500  Gross per 24 hour  Intake   1100 ml  Output    750 ml  Net    350 ml   Filed Weights   04/30/13 1146  Weight: 81.829 kg (180 lb 6.4 oz)    Exam:   General:  No distress.   Cardiovascular: S 1, S 2 RRR  Respiratory: CTA  Abdomen: BS present, soft, nt  Musculoskeletal: no edema.   Data Reviewed: Basic Metabolic Panel:  Recent Labs Lab 04/24/13 1122 04/30/13 0920 05/01/13 0405  NA 140 135 132*  K 3.9 4.0 4.2  CL  --  102 101  CO2 28 25 24   GLUCOSE 103 165* 133*  BUN 17.8 13 13   CREATININE 0.8 1.09 0.96  CALCIUM 8.5 8.8 7.9*   Liver Function Tests:  Recent Labs Lab 04/24/13 1122  AST 13  ALT 8  ALKPHOS 89  BILITOT 0.47  PROT 6.1*  ALBUMIN 2.8*   No  results found for this basename: LIPASE, AMYLASE,  in the last 168 hours No results found for this basename: AMMONIA,  in the last 168 hours CBC:  Recent Labs Lab 04/24/13 1122 04/30/13 0920 05/01/13 0405  WBC 4.6 14.5* 9.4  NEUTROABS 3.1 13.2*  --   HGB 12.5* 12.2* 11.1*  HCT 37.3* 35.6* 32.6*  MCV 91.8 90.6 91.3  PLT 163 169 131*   Cardiac Enzymes: No results found for this basename: CKTOTAL, CKMB, CKMBINDEX, TROPONINI,  in the last 168 hours BNP (last 3 results) No results found for this basename: PROBNP,  in the last 8760 hours CBG: No results found for this basename: GLUCAP,  in the last 168 hours  Recent Results (from the past 240 hour(s))  URINE CULTURE     Status: None   Collection Time    04/22/13 11:35 AM      Result Value Range Status   Specimen Description URINE, CLEAN CATCH   Final   Special Requests NONE   Final   Culture  Setup Time     Final   Value: 04/22/2013 20:54     Performed at [facility] Count     Final   Value: >=100,000 COLONIES/ML     Performed at [facility]  Final   Value: ENTEROBACTER CLOACAE     Performed at [facility]   Report Status 04/24/2013 FINAL   Final   Organism ID, Bacteria ENTEROBACTER CLOACAE   Final  SURGICAL PCR SCREEN     Status: None   Collection Time    04/30/13 11:13 PM      Result Value Range Status   MRSA, PCR NEGATIVE  NEGATIVE Final   Staphylococcus aureus NEGATIVE  NEGATIVE Final   Comment:            The Xpert SA Assay (FDA     approved for NASAL specimens     in patients over 60 years of age),     is one component of     a comprehensive surveillance     program.  Test performance has     been validated by [employer] for patients greater     than or equal to 56 year old.     It is not intended     to diagnose infection nor to     guide or monitor treatment.     Studies: Dg Chest 1 View  04/30/2013   CLINICAL DATA:  Bladder cancer. Fall with left  upper chest pain.  EXAM: CHEST - 1 VIEW  COMPARISON:  None.  FINDINGS: Left medial clavicular fracture noted. Extensive bilateral pleural calcification. Borderline cardiomegaly. Thoracic spondylosis.  No pneumothorax or pleural effusion observed.  IMPRESSION: 1. Acute left medial clavicular fracture. 2. Extensive bilateral pleural calcification. 3. Borderline cardiomegaly.   Electronically Signed   By: Omar [name] M.D.   On: 04/30/2013 09:33   Dg Clavicle Left  04/30/2013   CLINICAL DATA:  Fall. Left upper chest pain. Medial clavicular swelling.  EXAM: LEFT CLAVICLE - 2+ VIEWS  COMPARISON:  None.  FINDINGS: Cortical discontinuity medially in the clavicle is compatible with fracture. The patient had a CT scan that included this region 1 week ago and I do not see an underlying lytic or destructive lesion to suggest that this fracture is a pathologic fracture.  Extensive pleural calcification on the left. Degenerative glenohumeral joint findings on the left. Mild degenerative AC joint arthropathy.  IMPRESSION: 1. Medial clavicular metaphyseal fracture on the left. 2. Calcified pleural plaques. 3. Degenerative glenohumeral arthropathy.   Electronically Signed   By: Omar [name] M.D.   On: 04/30/2013 09:30   Dg Hip Complete Left  04/30/2013   CLINICAL DATA:  Traumatic injury with pain  EXAM: LEFT HIP - COMPLETE 2+ VIEW  COMPARISON:  None.  FINDINGS: There is a left femoral neck fracture with impaction and angulation at the fracture site. The pelvic ring is intact. No other focal abnormality is seen.  IMPRESSION: Left femoral neck fracture.   Electronically Signed   By: Omar [name] M.D.   On: 04/30/2013 09:30    Scheduled Meds: . acidophilus  1 capsule Oral Daily  . aspirin  81 mg Oral Daily  .  ceFAZolin (ANCEF) IV  2 g Intravenous On Call to OR  . docusate sodium  100 mg Oral BID  . finasteride  5 mg Oral Daily  . influenza vac split quadrivalent PF  0.5 mL Intramuscular Tomorrow-1000  .  sulfamethoxazole-trimethoprim  1 tablet Oral BID   Continuous Infusions: . dextrose 5 % and 0.45% NaCl 100 mL/hr (04/30/13 1655)    Principal Problem:   Hip fracture Active Problems:   Cancer of dome of urinary bladder   Clavicle fracture  Time spent: 25 minutes.     Omar [name]  [facility] Pager [phone]. If 7PM-7AM, please contact night-coverage at [email or web address], password [email or web address] 05/01/2013, 8:20 AM  LOS: 1 day

## 2013-05-01 NOTE — Preoperative (Signed)
Beta Blockers   Reason not to administer Beta Blockers:Not Applicable 

## 2013-05-01 NOTE — H&P (View-Only) (Signed)
ORTHOPAEDIC CONSULTATION  REQUESTING PHYSICIAN: Vida Roller, MD  Chief Complaint: Left femoral neck fracture  HPI: Omar [name] is a 79 y.o. male who complains of  Fall from standing.   Past Medical History  Diagnosis Date  . History of pulmonary embolus (PE)     APRIL 2011--  MULTIPLE PE'S AND DVT  . BPH (benign prostatic hypertrophy)   . History of urinary retention   . History of transurethral destruction of bladder lesion   . H/O asbestos exposure   . History of DVT (deep vein thrombosis)     APRIL 2011--  UPPER AND LOWER EXTREMITIES  . Bladder tumor     recurrent  . Nocturia   . Heart murmur, systolic     enlarged  . Bladder cancer 10/20/12    high grade papillary/invasive urothelial  . Hx of radiation therapy 01/02/13- 02/19/13    subtotal bladder 4500 cGy 25 sessions, boost 1600 cGy 8 sessions  . Skin cancer 03/2013    left cheek   Past Surgical History  Procedure Laterality Date  . Repair recurrent left inguinal hernia  06-01-1999    x 2  . Transurethral resection of prostate  1999   &  11-17-2000  . Cysto/ eua/ placement suprapubic tube  01-13-2000  . Transurethral resection of bladder tumor  06-30-2010  . Transurethral resection of prostate  07-31-2010    AND BLADDER RESECTION BLADDER TUMOR  . Vena cava filter placement  APRIL 2011    INFERIOR  . Transthoracic echocardiogram  10-11-2012   DR TILLEY    CONCENTRIC LVH WITH NORMAL LVSF/ EF 55%/ GRADE I DIASTOLIC DYSFUNCTION  . Transurethral resection of bladder tumor N/A 10/20/2012    Procedure: TRANSURETHRAL RESECTION OF BLADDER TUMOR (TURBT);  Surgeon: Lindaann Slough, MD;  Location: [facility];  Service: Urology;  Laterality: N/A;  . Cystoscopy N/A 10/20/2012    Procedure: CYSTOSCOPY;  Surgeon: Lindaann Slough, MD;  [facility];  Service: Urology;  Laterality: N/A;  . Cataract surgery      BILAT. EYES  . Mohs surgery  03/2013    left cheek   History    Social History  . Marital Status: Married    Spouse Name: N/A    Number of Children: 4  . Years of Education: N/A   Occupational History  . RETIRED     [employer]   Social History Main Topics  . Smoking status: Never Smoker   . Smokeless tobacco: None  . Alcohol Use: No  . Drug Use: No  . Sexual Activity: None   Other Topics Concern  . None   Social History Narrative   Married for 73 years.drives to see wife daily and feed lunch daily   WWll 2 years   No family history of cancer/prostate   No family history on file. Allergies  Allergen Reactions  . Benadryl [Diphenhydramine Hcl] Other (See Comments)    Urinary retention  . Latex Other (See Comments)    blisters   Prior to Admission medications   Medication Sig Start Date End Date Taking? Authorizing Provider  acidophilus (RISAQUAD) CAPS Take 1 capsule by mouth daily.   Yes Historical Provider, MD  aspirin 81 MG tablet Take 81 mg by mouth daily.   Yes Historical Provider, MD  CRANBERRY JUICE EXTRACT PO Take by mouth daily.   Yes Historical Provider, MD  finasteride (PROSCAR) 5 MG tablet Take 5 mg by mouth daily.  Yes Historical Provider, MD  Omega-3 Fatty Acids (FISH OIL) 1000 MG CAPS Take by mouth daily.   Yes Historical Provider, MD  Saw Palmetto, Serenoa repens, (SAW PALMETTO PO) Take 1 tablet by mouth daily.    Yes Historical Provider, MD  sulfamethoxazole-trimethoprim (BACTRIM DS) 800-160 MG per tablet Take 1 tablet by mouth 2 (two) times daily. 04/25/13 05/02/13 Yes Historical Provider, MD   Dg Chest 1 View  04/30/2013   CLINICAL DATA:  Bladder cancer. Fall with left upper chest pain.  EXAM: CHEST - 1 VIEW  COMPARISON:  None.  FINDINGS: Left medial clavicular fracture noted. Extensive bilateral pleural calcification. Borderline cardiomegaly. Thoracic spondylosis.  No pneumothorax or pleural effusion observed.  IMPRESSION: 1. Acute left medial clavicular fracture. 2. Extensive bilateral pleural  calcification. 3. Borderline cardiomegaly.   Electronically Signed   By: Herbie Baltimore M.D.   On: 04/30/2013 09:33   Dg Clavicle Left  04/30/2013   CLINICAL DATA:  Fall. Left upper chest pain. Medial clavicular swelling.  EXAM: LEFT CLAVICLE - 2+ VIEWS  COMPARISON:  None.  FINDINGS: Cortical discontinuity medially in the clavicle is compatible with fracture. The patient had a CT scan that included this region 1 week ago and I do not see an underlying lytic or destructive lesion to suggest that this fracture is a pathologic fracture.  Extensive pleural calcification on the left. Degenerative glenohumeral joint findings on the left. Mild degenerative AC joint arthropathy.  IMPRESSION: 1. Medial clavicular metaphyseal fracture on the left. 2. Calcified pleural plaques. 3. Degenerative glenohumeral arthropathy.   Electronically Signed   By: Herbie Baltimore M.D.   On: 04/30/2013 09:30   Dg Hip Complete Left  04/30/2013   CLINICAL DATA:  Traumatic injury with pain  EXAM: LEFT HIP - COMPLETE 2+ VIEW  COMPARISON:  None.  FINDINGS: There is a left femoral neck fracture with impaction and angulation at the fracture site. The pelvic ring is intact. No other focal abnormality is seen.  IMPRESSION: Left femoral neck fracture.   Electronically Signed   By: Alcide Clever M.D.   On: 04/30/2013 09:30    Positive ROS: All other systems have been reviewed and were otherwise negative with the exception of those mentioned in the HPI and as above.  Labs cbc  Recent Labs  04/30/13 0920  WBC 14.5*  HGB 12.2*  HCT 35.6*  PLT 169    Labs inflam No results found for this basename: ESR, CRP,  in the last 72 hours  Labs coag  Recent Labs  04/30/13 0920  INR 1.02    No results found for this basename: NA, K, CL, CO2, GLUCOSE, BUN, CREATININE, CALCIUM,  in the last 72 hours  Physical Exam: Filed Vitals:   04/30/13 0838  BP: 153/65  Pulse: 63  Temp: 97.9 F (36.6 C)  Resp: 16   General: Alert, no  acute distress Cardiovascular: No pedal edema Respiratory: No cyanosis, no use of accessory musculature GI: No organomegaly, abdomen is soft and non-tender Skin: No lesions in the area of chief complaint Neurologic: Sensation intact distally Psychiatric: Patient is competent for consent with normal mood and affect Lymphatic: No axillary or cervical lymphadenopathy  MUSCULOSKELETAL:  LLE: Pain with ROM, NVI, skin benign  Other extremities are atraumatic with painless ROM and NVI.  Assessment: Left fem neck fracture  Plan: Left hip hemiarthroplasty on 10/21 Weight Bearing Status: Bedrest for now PT VTE px: SCD's and Chemical px post op.    Sheral Apley, MD  Cell [phone]   04/30/2013 9:53 AM

## 2013-05-01 NOTE — Anesthesia Procedure Notes (Signed)
Spinal  Patient location during procedure: OR Start time: 05/01/2013 11:10 AM End time: 05/01/2013 11:16 AM Staffing Anesthesiologist: Remonia Richter Performed by: anesthesiologist  Preanesthetic Checklist Completed: patient identified, site marked, surgical consent, pre-op evaluation, timeout performed, IV checked, risks and benefits discussed and monitors and equipment checked Spinal Block Patient position: sitting Prep: Betasept and site prepped and draped Patient monitoring: heart rate, cardiac monitor, continuous pulse ox and blood pressure Approach: left paramedian Location: L3-4 Injection technique: single-shot Needle Needle type: Quincke  Needle gauge: 22 G Needle length: 9 cm

## 2013-05-01 NOTE — Progress Notes (Signed)
Orthopedic Tech Progress Note Patient Details:  Omar [name] [birth year] [id number] No overhead frames at this time. Patient ID: Omar [name], male   DOB: 1916/05/14, 79 y.o.   MRN: [id number]   Omar [name] 05/01/2013, 4:54 PM

## 2013-05-01 NOTE — Anesthesia Postprocedure Evaluation (Signed)
Anesthesia Post Note  Patient: Omar [name]  Procedure(s) Performed: Procedure(s) (LRB): ARTHROPLASTY BIPOLAR HIP ([name])  Anesthesia type: SAB  Patient location: PACU  Post pain: Pain level controlled  Post assessment: Patient's Cardiovascular Status Stable  Last Vitals:  Filed Vitals:   05/01/13 1419  BP: 111/52  Pulse: 65  Temp:   Resp: 18    Post vital signs: Reviewed and stable  Level of consciousness: alert  Complications: No apparent anesthesia complications

## 2013-05-01 NOTE — Progress Notes (Addendum)
Patient not producing urine in foley. Urine color brown. Last emptied at 1215 in OR. IV fluids infusing at 146mL/hr. Paged Dr. Sunnie Nielsen. Orders were given to bladder scan and page the Dr. Merrilyn Puma tonight if patient retaining urine.   Patient's bladder was scanned at 1850 and recorded. Orders were acknowledged to increase fluid rate to 140mL/hr. Will continue to monitor.

## 2013-05-01 NOTE — Interval H&P Note (Signed)
History and Physical Interval Note:  05/01/2013 11:10 AM  Omar [name]  has presented today for surgery, with the diagnosis of [name] HIP FRACTURE  The various methods of treatment have been discussed with the patient and family. After consideration of risks, benefits and other options for treatment, the patient has consented to  Procedure(s): ARTHROPLASTY BIPOLAR HIP ([name]) as a surgical intervention .  The patient's history has been reviewed, patient examined, no change in status, stable for surgery.  I have reviewed the patient's chart and labs.  Questions were answered to the patient's satisfaction.     Edinson Domeier, D

## 2013-05-01 NOTE — Op Note (Signed)
04/30/2013 - 05/01/2013  12:44 PM  PATIENT:  Omar [name]   MRN: [id number]  PRE-OPERATIVE DIAGNOSIS:  LEFT HIP FRACTURE  POST-OPERATIVE DIAGNOSIS:  LEFT HIP FRACTURE  PROCEDURE:  Procedure(s): ARTHROPLASTY BIPOLAR HIP  PREOPERATIVE INDICATIONS:  MARQ REBELLO is an 79 y.o. male who was admitted 04/30/2013 with a diagnosis of Hip fracture and elected for surgical management.  The risks benefits and alternatives were discussed with the patient including but not limited to the risks of nonoperative treatment, versus surgical intervention including infection, bleeding, nerve injury, periprosthetic fracture, the need for revision surgery, dislocation, leg length discrepancy, blood clots, cardiopulmonary complications, morbidity, mortality, among others, and they were willing to proceed.  Predicted outcome is good, although there will be at least a six to nine month expected recovery.   OPERATIVE REPORT     SURGEON:  Margarita Rana, MD    ASSISTANT:  None    ANESTHESIA:  General    COMPLICATIONS:  None.      COMPONENTS:  [employer] Acolade: Femoral stem: 5, Femoral Head:50, Neck:+4   PROCEDURE IN DETAIL: The patient was met in the holding area and identified.  The appropriate hip  was marked at the operative site. The patient was then transported to the OR and  placed under general anesthesia.  At that point, the patient was  placed in the lateral decubitus position with the operative side up and  secured to the operating room table and all bony prominences padded.     The operative lower extremity was prepped from the iliac crest to the toes.  Sterile draping was performed.  Time out was performed prior to incision.      A routine posterolateral approach was utilized via sharp dissection  carried down to the subcutaneous tissue.  Gross bleeders were Bovie  coagulated.  The iliotibial band was identified and incised  along the length of the skin incision.  Self-retaining retractors  were  inserted.  With the hip internally rotated, the short external rotators  were identified. The piriformis was tagged with FiberWire, and the hip capsule released in a T-type fashion.  The femoral neck was exposed, and I resected the femoral neck using the appropriate jig. This was performed at approximately a thumb's breadth above the lesser trochanter.    I then exposed the deep acetabulum, cleared out any tissue including the ligamentum teres, and included the hip capsule in the FiberWire used above and below the T.    I then prepared the proximal femur using the cookie-cutter, the lateralizing reamer, and then sequentially broached.  A trial utilized, and I reduced the hip and it was found to have excellent stability with functional range of motion. The trial components were then removed.   The canal and acetabulum were thoroughly irrigated  I inserted the pressfit stem and placed the head and neck collar. The hip was reduced with appropriate force and was stable through a range of motion.   I then used a 2 mm drill bits to pass the FiberWire suture from the capsule and puriform is through the greater trochanter, and secured this. Excellent posterior capsular repair was achieved. I also closed the T in the capsule.  I then irrigated the hip copiously again with pulse lavage, and repaired the fascia with Vicryl, followed by Vicryl for the subcutaneous tissue, Monocryl for the skin, Steri-Strips and sterile gauze. The wounds were injected. The patient was then awakened and returned to PACU in stable and satisfactory condition. There were  no complications.  POST-OP PLAN: Weight bearing as tolerated. DVT px will consist of SCD's and AsA 325  Margarita Rana, MD Orthopedic Surgeon [phone]   05/01/2013 12:44 PM

## 2013-05-01 NOTE — Transfer of Care (Signed)
Immediate Anesthesia Transfer of Care Note  Patient: Omar [name]  Procedure(s) Performed: Procedure(s): ARTHROPLASTY BIPOLAR HIP ([name])  Patient Location: PACU  Anesthesia Type:Spinal  Level of Consciousness: awake, alert  and patient cooperative  Airway & Oxygen Therapy: Patient Spontanous Breathing and Patient connected to nasal cannula oxygen  Post-op Assessment: Report given to PACU RN and Post -op Vital signs reviewed and stable  Post vital signs: Reviewed and stable  Complications: No apparent anesthesia complications

## 2013-05-02 DIAGNOSIS — D62 Acute posthemorrhagic anemia: Secondary | ICD-10-CM

## 2013-05-02 LAB — BASIC METABOLIC PANEL
CO2: 24 mEq/L (ref 19–32)
Calcium: 7.6 mg/dL — ABNORMAL LOW (ref 8.4–10.5)
Creatinine, Ser: 0.89 mg/dL (ref 0.50–1.35)
GFR calc Af Amer: 82 mL/min — ABNORMAL LOW (ref >90)
GFR calc non Af Amer: 70 mL/min — ABNORMAL LOW (ref >90)
Potassium: 4.2 mEq/L (ref 3.5–5.1)
Sodium: 131 mEq/L — ABNORMAL LOW (ref 135–145)

## 2013-05-02 LAB — CBC
MCH: 31 pg (ref 26.0–34.0)
MCHC: 34.4 g/dL (ref 30.0–36.0)
MCV: 90 fL (ref 78.0–100.0)
Platelets: 127 10*3/uL — ABNORMAL LOW (ref 150–400)
RBC: 3.1 MIL/uL — ABNORMAL LOW (ref 4.22–5.81)
RDW: 14.1 % (ref 11.5–15.5)

## 2013-05-02 MED ORDER — LACTULOSE 10 GM/15ML PO SOLN
20.0000 g | Freq: Two times a day (BID) | ORAL | Status: DC | PRN
Start: 2013-05-02 — End: 2013-05-04
  Administered 2013-05-03: 20 g via ORAL
  Filled 2013-05-02: qty 30

## 2013-05-02 NOTE — Progress Notes (Signed)
Utilization Review Completed.Omar [name] T10/22/2014  

## 2013-05-02 NOTE — Progress Notes (Addendum)
[facility] PROGRESS NOTE  [name] [name] LKG:[id number] DOB: May 22, 1916 DOA: 04/30/2013 PCP: Kaleen Mask, MD  Assessment/Plan: Left Hip fracture  Orthopedic consult, status post arthroplasty, procedure performed on 05/01/2013 with no immediate complications. Hemoglobin did trend down to 9.6, will follow H&H, repeat labs in a.m. physical therapy consulted, social work consult for rehabilitation placement.  Acute blood loss anemia. Post orthopedic surgery. His hemoglobin trended down from 11.1 on yesterday's lab work to 9.6 on this mornings lab work. Will continue monitoring a.m. labs.  Hx bladder cancer  Status post radiation therapy, following oncology.   Clavicular fracture  His left upper extremity and a sling. Continue pain management.  Hx of DVT and PE  Patient on bilateral extremity SCDs and aspirin 325 mg by mouth daily  UTI, enterococcus UA 10-14:  Continue Bactrim DS  Code Status: DO NOT RESUSCITATE Family Communication: Plan discussed with patient and son present at bedside Disposition Plan: Social work consult for placement   Consultants:  Orthopedic  Procedures:  Left hip arthroplasty, performed on 05/01/2013  Antibiotics: Bactrim  HPI/Subjective: Patient is doing well this morning. He is sitting up, having his breakfast. Has no complaints  Objective: Filed Vitals:   05/02/13 0753  BP: 116/57  Pulse: 92  Temp: 97.9 F (36.6 C)  Resp: 18    Intake/Output Summary (Last 24 hours) at 05/02/13 1013 Last data filed at 05/02/13 6644  Gross per 24 hour  Intake   2580 ml  Output   1400 ml  Net   1180 ml   Filed Weights   04/30/13 1146  Weight: 81.829 kg (180 lb 6.4 oz)    Exam:   General:  Awake alert, mildly confused  Cardiovascular: Regular rate rhythm normal S1-S2  Respiratory: Overall clear to auscultation bilaterally, normal respiratory effort  Abdomen: Soft nontender nondistended  Musculoskeletal: Status post left  hip hemiarthroplasty, bruising around surgical incision site noted.   Data Reviewed: Basic Metabolic Panel:  Recent Labs Lab 04/30/13 0920 05/01/13 0405 05/02/13 0517  NA 135 132* 131*  K 4.0 4.2 4.2  CL 102 101 98  CO2 25 24 24   GLUCOSE 165* 133* 145*  BUN 13 13 16   CREATININE 1.09 0.96 0.89  CALCIUM 8.8 7.9* 7.6*   Liver Function Tests: No results found for this basename: AST, ALT, ALKPHOS, BILITOT, PROT, ALBUMIN,  in the last 168 hours No results found for this basename: LIPASE, AMYLASE,  in the last 168 hours No results found for this basename: AMMONIA,  in the last 168 hours CBC:  Recent Labs Lab 04/30/13 0920 05/01/13 0405 05/02/13 0517  WBC 14.5* 9.4 9.4  NEUTROABS 13.2*  --   --   HGB 12.2* 11.1* 9.6*  HCT 35.6* 32.6* 27.9*  MCV 90.6 91.3 90.0  PLT 169 131* 127*   Cardiac Enzymes: No results found for this basename: CKTOTAL, CKMB, CKMBINDEX, TROPONINI,  in the last 168 hours BNP (last 3 results) No results found for this basename: PROBNP,  in the last 8760 hours CBG: No results found for this basename: GLUCAP,  in the last 168 hours  Recent Results (from the past 240 hour(s))  URINE CULTURE     Status: None   Collection Time    04/22/13 11:35 AM      Result Value Range Status   Specimen Description URINE, CLEAN CATCH   Final   Special Requests NONE   Final   Culture  Setup Time     Final   Value: 04/22/2013  20:54     Performed at [facility] Count     Final   Value: >=100,000 COLONIES/ML     Performed at [facility]   Culture     Final   Value: ENTEROBACTER CLOACAE     Performed at [facility]   Report Status 04/24/2013 FINAL   Final   Organism ID, Bacteria ENTEROBACTER CLOACAE   Final  SURGICAL PCR SCREEN     Status: None   Collection Time    04/30/13 11:13 PM      Result Value Range Status   MRSA, PCR NEGATIVE  NEGATIVE Final   Staphylococcus aureus NEGATIVE  NEGATIVE Final   Comment:            The  Xpert SA Assay (FDA     approved for NASAL specimens     in patients over 57 years of age),     is one component of     a comprehensive surveillance     program.  Test performance has     been validated by [employer] for patients greater     than or equal to 50 year old.     It is not intended     to diagnose infection nor to     guide or monitor treatment.     Studies: Dg Pelvis Portable  05/01/2013   CLINICAL DATA:  Bipolar left hip arthroplasty.  EXAM: PORTABLE PELVIS  COMPARISON:  04/24/2013  FINDINGS: Left hip bipolar prosthesis noted without fracture or complicating feature.  Lower lumbar spondylosis. IVC filter partially visualized.  IMPRESSION: 1. Left hip bipolar prosthesis without complicating feature. .   Electronically Signed   By: Herbie Baltimore M.D.   On: 05/01/2013 14:03    Scheduled Meds: . acidophilus  1 capsule Oral Daily  . aspirin EC  325 mg Oral Q breakfast  . docusate sodium  100 mg Oral BID  . finasteride  5 mg Oral Daily  . sulfamethoxazole-trimethoprim  1 tablet Oral BID   Continuous Infusions: . sodium chloride 100 mL/hr at 05/02/13 0216    Principal Problem:   Hip fracture Active Problems:   Cancer of dome of urinary bladder   Clavicle fracture    Time spent: 35 minutes    Jeralyn Bennett  [facility] Pager [phone]. If 7PM-7AM, please contact night-coverage at [email or web address], password [facility] 05/02/2013, 10:13 AM  LOS: 2 days

## 2013-05-02 NOTE — Clinical Social Work Placement (Addendum)
Clinical Social Work Department  CLINICAL SOCIAL WORK PLACEMENT NOTE   Patient: Omar [name]  Account Number: [id number]  Admit date: 04/30/13 Clinical Social Worker: Sabino Niemann LCSWA Date/time: 05/02/2013 1:00 PM Clinical Social Work is seeking post-discharge placement for this patient at the following level of care: SKILLED NURSING (*CSW will update this form in Epic as items are completed)  05/02/2013 Patient/family provided with [facility] Department of Clinical Social Work's list of facilities offering this level of care within the geographic area requested by the patient (or if unable, by the patient's family).  10/22/2014Patient/family informed of their freedom to choose among providers that offer the needed level of care, that participate in Medicare, Medicaid or managed care program needed by the patient, have an available bed and are willing to accept the patient.  10/22/2014Patient/family informed of [facility]' ownership interest in [facility], as well as of the fact that they are under no obligation to receive care at this facility.  PASARR submitted to EDS on 05/02/2013 PASARR number received from [employer]DS on 05/02/2013 FL2 transmitted to all facilities in geographic area requested by pt/family on 05/02/2013 FL2 transmitted to all facilities within larger geographic area on  Patient informed that his/her managed care company has contracts with or will negotiate with certain facilities, including the following:  Patient/family informed of bed offers received: 05/02/13  Patient chooses bed at [facility] Physician recommends and patient chooses bed at  Patient to be transferred to on 05/04/2013 Patient to be transferred to facility by [facility] The following physician request were entered in Epic:  Additional Comments:

## 2013-05-02 NOTE — Evaluation (Addendum)
Physical Therapy Evaluation Patient Details Name: Omar [name] MRN: [id number] DOB: 1915-11-07 Today's Date: 05/02/2013 Time: 1020-1040 PT Time Calculation (min): 20 min  PT Assessment / Plan / Recommendation History of Present Illness  s/p Fall resulting in Lt hip fx; s/p Lt THA; pt also sustained Lt Clavicle fx . PMH includes: anemia, bladder cancer, DVT and PE and UTI.   Clinical Impression  Pt is s/p Lt THA due to fall at home resulting in Lt hip fx and Lt clavicle fx resulting in the deficits listed below (see PT Problem List). Pt will benefit from skilled PT to increase their independence and safety with mobility to allow discharge to the venue listed below. No WB status on Lt UE at this time. Pt required 2+ for transfers bed <> chair. Pt and family are requesting pt receive rehab at [facility], where is wife stays. Pt motivated to return to independence.      PT Assessment  Patient needs continued PT services    Follow Up Recommendations  SNF;Supervision/Assistance - 24 hour    Does the patient have the potential to tolerate intense rehabilitation      Barriers to Discharge Decreased caregiver support pt lives alone    Equipment Recommendations  Other (comment) (TBD at SNF)    Recommendations for Other Services     Frequency Min 3X/week    Precautions / Restrictions Precautions Precautions: Fall;Posterior Hip Precaution Booklet Issued: Yes (comment) Precaution Comments: pt given posterior hip precautions handout and reviewed with pt and family; cues during session to adhere Required Braces or Orthoses: Sling Restrictions Weight Bearing Restrictions: Yes LUE Weight Bearing: Non weight bearing LLE Weight Bearing: Weight bearing as tolerated   Pertinent Vitals/Pain Did not rate pain. See VSS.       Mobility  Bed Mobility Bed Mobility: Supine to Sit;Sitting - Scoot to Edge of Bed Supine to Sit: 3: Mod assist;HOB elevated;With rails Sitting - Scoot to Edge of Bed:  4: Min assist Details for Bed Mobility Assistance: (A) to bring trunk and Lt LE to EOB; cues for hand placement and sequencing and to adhere to posterior hip precautions; pt requires incr time due to difficulty with bed moblity Transfers Transfers: Sit to Stand;Stand to Sit;Stand Pivot Transfers Sit to Stand: 1: +2 Total assist;From bed;With upper extremity assist;From elevated surface Sit to Stand: Patient Percentage: 40% Stand to Sit: 1: +2 Total assist;To chair/3-in-1;With armrests;With upper extremity assist Stand to Sit: Patient Percentage: 40% Stand Pivot Transfers: 1: +2 Total assist;From elevated surface;With armrests Stand Pivot Transfers: Patient Percentage: 40% Details for Transfer Assistance: pt with difficulty powering up due to pain with WB through Lt LE and decreased ability to WB through Lt UE. max cues for hand placement and sequencing Ambulation/Gait Ambulation/Gait Assistance: Not tested (comment) Stairs: No Wheelchair Mobility Wheelchair Mobility: No    Exercises General Exercises - Lower Extremity Ankle Circles/Pumps: AROM;Both;10 reps;Supine   PT Diagnosis: Difficulty walking;Generalized weakness;Acute pain  PT Problem List: Decreased strength;Decreased range of motion;Decreased balance;Decreased activity tolerance;Decreased mobility;Decreased knowledge of use of DME;Pain;Decreased knowledge of precautions PT Treatment Interventions: DME instruction;Gait training;Functional mobility training;Therapeutic activities;Balance training;Therapeutic exercise;Neuromuscular re-education;Patient/family education     PT Goals(Current goals can be found in the care plan section) Acute Rehab PT Goals Patient Stated Goal: to get back to being independent  PT Goal Formulation: With patient/family Time For Goal Achievement: 05/09/13 Potential to Achieve Goals: Good  Visit Information  Last PT Received On: 05/02/13 Assistance Needed: +2 PT/OT Co-Evaluation/Treatment: Yes  (partial time)  History of Present Illness: s/p Fall resulting in Lt hip fx; s/p Lt THA; pt also sustained Lt Clavicle fx        Prior Functioning  Home Living Family/patient expects to be discharged to:: Skilled nursing facility Living Arrangements: Alone Additional Comments: Pt lived alone and was independent with QC; pt drives  Prior Function Level of Independence: Independent with assistive device(s) Communication Communication: HOH Dominant Hand: Right    Cognition  Cognition Arousal/Alertness: Awake/alert Behavior During Therapy: WFL for tasks assessed/performed Overall Cognitive Status: Within Functional Limits for tasks assessed    Extremity/Trunk Assessment Upper Extremity Assessment Upper Extremity Assessment: Overall WFL for tasks assessed;Generalized weakness;LUE deficits/detail LUE Deficits / Details: sling, L clavicle fx LUE: Unable to fully assess due to immobilization Lower Extremity Assessment Lower Extremity Assessment: Defer to PT evaluation LLE Deficits / Details: grossly 3/5; limited by pain LLE: Unable to fully assess due to pain LLE Sensation:  (WFL to light touch) Cervical / Trunk Assessment Cervical / Trunk Assessment: Kyphotic   Balance Balance Balance Assessed: Yes Static Sitting Balance Static Sitting - Balance Support: Right upper extremity supported;Feet supported Static Sitting - Level of Assistance: 5: Stand by assistance Static Sitting - Comment/# of Minutes: tolerated sitting EOB ~5 min to prepare for transfer  Static Standing Balance Static Standing - Balance Support: Right upper extremity supported;During functional activity Static Standing - Level of Assistance: 1: +2 Total assist Static Standing - Comment/# of Minutes: attempted to use urinal; required 2+ to steady   End of Session PT - End of Session Equipment Utilized During Treatment: Gait belt;Other (comment) (Lt UE sling) Activity Tolerance: Patient tolerated treatment  well Patient left: in chair;with call bell/phone within reach;with family/visitor present;Other (comment) (OT in room) Nurse Communication: Mobility status  GP     Omar [name], Omar [name] [place] [phone] 05/02/2013, 1:27 PM

## 2013-05-02 NOTE — Evaluation (Addendum)
Occupational Therapy Evaluation Patient Details Name: Omar [name] MRN: [id number] DOB: Jul 08, 1916 Today's Date: 05/02/2013 Time: 4098-1191 OT Time Calculation (min): 28 min  OT Assessment / Plan / Recommendation History of present illness s/p Fall resulting in Lt hip fx; s/p Lt THA; pt also sustained Lt Clavicle fx    Clinical Impression   Pt demos decline in function with ADLs and ADL mobility safety and would benefit from acute OT services to address impairments to increase level of function and safety. Pt and family provided with sling education. Pt requires 2 person assist for mobility and extensive assist with ADLs at this time. Pt very pleasant ans coopertaive    OT Assessment  Patient needs continued OT Services    Follow Up Recommendations  SNF;Supervision/Assistance - 24 hour    Barriers to Discharge Decreased caregiver support Pt lives at home alone, plans to d/c to [facility] where wife resides on assisted living floor  Equipment Recommendations  None recommended by OT;Other (comment) (TBD at SNF level of care)    Recommendations for Other Services    Frequency  Min 2X/week    Precautions / Restrictions Precautions Precautions: Fall;Posterior Hip Precaution Booklet Issued: Yes (comment) Precaution Comments: pt given posterior hip precautions handout and reviewed with pt and family; cues during session to adhere Required Braces or Orthoses: Sling Restrictions Weight Bearing Restrictions: Yes LUE Weight Bearing: Non weight bearing LLE Weight Bearing: Weight bearing as tolerated   Pertinent Vitals/Pain 5/10    ADL  Grooming: Performed;Set up;Wash/dry face Where Assessed - Grooming: Supported sitting Upper Body Bathing: Simulated;Moderate assistance Lower Body Bathing: Simulated;Maximal assistance Upper Body Dressing: Performed;Moderate assistance Lower Body Dressing: Maximal assistance;+1 Total assistance Toilet Transfer: Simulated;+2 Total  assistance Toilet Transfer Method: Sit to stand Toileting - Clothing Manipulation and Hygiene: +1 Total assistance Where Assessed - Toileting Clothing Manipulation and Hygiene: Standing Tub/Shower Transfer Method: Not assessed Equipment Used: Gait belt ADL Comments: L UE immobilized in sling, mod A required for UB ADLs, sling education provided   OT Diagnosis: Generalized weakness;Acute pain  OT Problem List: Decreased strength;Decreased knowledge of use of DME or AE;Decreased activity tolerance;Pain;Impaired balance (sitting and/or standing);Decreased knowledge of precautions OT Treatment Interventions: Self-care/ADL training;Therapeutic exercise;Patient/family education;Neuromuscular education;Balance training;Therapeutic activities;DME and/or AE instruction   OT Goals(Current goals can be found in the care plan section) Acute Rehab OT Goals Patient Stated Goal: to get back to being independent  OT Goal Formulation: With patient/family Time For Goal Achievement: 05/09/13 Potential to Achieve Goals: Good ADL Goals Pt Will Perform Grooming: with min assist;with min guard assist;standing Pt Will Perform Upper Body Bathing: with min assist;sitting Pt Will Perform Lower Body Bathing: with mod assist;with adaptive equipment Pt Will Perform Upper Body Dressing: with min assist;sitting Pt Will Transfer to Toilet: with total assist;with max assist;bedside commode;regular height toilet;grab bars;ambulating (3 in 1 over toilet)  Visit Information  Last OT Received On: 05/02/13 Assistance Needed: +2 History of Present Illness: s/p Fall resulting in Lt hip fx; s/p Lt THA; pt also sustained Lt Clavicle fx        Prior Functioning     Home Living Family/patient expects to be discharged to:: Skilled nursing facility Living Arrangements: Alone Additional Comments: Pt lived alone and was independent with QC; pt drives  Prior Function Level of Independence: Independent with assistive  device(s) Communication Communication: HOH Dominant Hand: Right         Vision/Perception Vision - History Baseline Vision: Wears glasses only for reading Patient Visual Report: No  change from baseline Perception Perception: Within Functional Limits   Cognition  Cognition Arousal/Alertness: Awake/alert Behavior During Therapy: WFL for tasks assessed/performed Overall Cognitive Status: Within Functional Limits for tasks assessed    Extremity/Trunk Assessment Upper Extremity Assessment Upper Extremity Assessment: Overall WFL for tasks assessed;Generalized weakness;LUE deficits/detail LUE Deficits / Details: sling, L clavicle fx LUE: Unable to fully assess due to immobilization Lower Extremity Assessment Lower Extremity Assessment: Defer to PT evaluation Cervical / Trunk Assessment Cervical / Trunk Assessment: Kyphotic     Mobility Bed Mobility Bed Mobility: Supine to Sit;Sitting - Scoot to Edge of Bed Supine to Sit: 3: Mod assist;HOB elevated;With rails Sitting - Scoot to Edge of Bed: 4: Min assist Details for Bed Mobility Assistance: (A) to bring trunk and Lt LE to EOB; cues for hand placement and sequencing and to adhere to posterior hip precautions; pt requires incr time due to difficulty with bed moblity Transfers Sit to Stand: 1: +2 Total assist;From bed;With upper extremity assist;From elevated surface Stand to Sit: 1: +2 Total assist;To chair/3-in-1;With armrests;With upper extremity assist     Exercise General Exercises - Lower Extremity Ankle Circles/Pumps: AROM;Both;10 reps;Supine   Balance Balance Balance Assessed: Yes Static Sitting Balance Static Sitting - Balance Support: Right upper extremity supported;Feet supported Static Sitting - Level of Assistance: 5: Stand by assistance Static Sitting - Comment/# of Minutes: tolerated sitting EOB ~5 min to prepare for transfer  Static Standing Balance Static Standing - Balance Support: Right upper extremity  supported;During functional activity Static Standing - Level of Assistance: 1: +2 Total assist Static Standing - Comment/# of Minutes: attempted to use urinal; required 2+ to steady    End of Session OT - End of Session Equipment Utilized During Treatment: Gait belt Activity Tolerance: Patient tolerated treatment well Patient left: in chair;with call bell/phone within reach;with family/visitor present  GO     Galen Manila 05/02/2013, 1:03 PM

## 2013-05-02 NOTE — Progress Notes (Signed)
Patient without sufficient spontaneous void since cath removal this am.  Fluids encouraged.  Bladder scan multiple times, largest amount showed 160 ml.  Dr. Vanessa Barbara aware, ok to I+O patient.  of dark brown urine expressed with I+O, clot of blood noted.  Dr. Vanessa Barbara aware.  Nursing will continue to monitor.  Fluids continue to be encouraged.

## 2013-05-02 NOTE — Progress Notes (Signed)
Foley bag with clear yellow urine approx 500cc.  Foley to be d/'c'd in am per post-op orders.  Continue to monitor.

## 2013-05-02 NOTE — Progress Notes (Signed)
    Subjective:  Patient reports pain as mild  Objective:   VITALS:   Filed Vitals:   05/02/13 0000 05/02/13 0213 05/02/13 0400 05/02/13 0753  BP:  120/57  116/57  Pulse:  90  92  Temp:  98.6 F (37 C)  97.9 F (36.6 C)  TempSrc:  Oral  Oral  Resp: 18 18 18 18   Height:      Weight:      SpO2: 98% 90% 100% 100%    Physical Exam  Dressing: C/D/I  Compartments soft  SILT DP/SP/S/S/T, 2+DP, +TA/GS/EHL  LABS  Results for orders placed during the hospital encounter of 04/30/13 (from the past 24 hour(s))  CBC     Status: Abnormal   Collection Time    05/02/13  5:17 AM      Result Value Range   WBC 9.4  4.0 - 10.5 K/uL   RBC 3.10 (*) 4.22 - 5.81 MIL/uL   Hemoglobin 9.6 (*) 13.0 - 17.0 g/dL   HCT 16.1 (*) 09.6 - 04.5 %   MCV 90.0  78.0 - 100.0 fL   MCH 31.0  26.0 - 34.0 pg   MCHC 34.4  30.0 - 36.0 g/dL   RDW 40.9  81.1 - 91.4 %   Platelets 127 (*) 150 - 400 K/uL  BASIC METABOLIC PANEL     Status: Abnormal   Collection Time    05/02/13  5:17 AM      Result Value Range   Sodium 131 (*) 135 - 145 mEq/L   Potassium 4.2  3.5 - 5.1 mEq/L   Chloride 98  96 - 112 mEq/L   CO2 24  19 - 32 mEq/L   Glucose, Bld 145 (*) 70 - 99 mg/dL   BUN 16  6 - 23 mg/dL   Creatinine, Ser 7.82  0.50 - 1.35 mg/dL   Calcium 7.6 (*) 8.4 - 10.5 mg/dL   GFR calc non Af Amer 70 (*) >90 mL/min   GFR calc Af Amer 82 (*) >90 mL/min     Assessment/Plan: 1 Day Post-Op   Principal Problem:   Hip fracture Active Problems:   Cancer of dome of urinary bladder   Clavicle fracture   PLAN: Weight Bearing: WBAT, Post hip precautions Dressings: C/D/I, change to methalex on sat or prior to D/c VTE prophylaxis: SCD's and ASA 325 daily Dispo: SNF vs [facility] HGb: will follow with a repeat HGB tomorrow and see if symptomatic   Cylis Ayars, D 05/02/2013, 8:54 AM   Margarita Rana, MD Cell [phone]

## 2013-05-02 NOTE — Progress Notes (Signed)
Patient draining clear, yellow urine in tubing.  400cc clear brown urine emptied from foley bag.  Continue to monitor.

## 2013-05-03 LAB — TYPE AND SCREEN
Antibody Screen: NEGATIVE
Unit division: 0
Unit division: 0

## 2013-05-03 LAB — BASIC METABOLIC PANEL
BUN: 17 mg/dL (ref 6–23)
CO2: 23 mEq/L (ref 19–32)
Chloride: 102 mEq/L (ref 96–112)
GFR calc Af Amer: 83 mL/min — ABNORMAL LOW (ref >90)
GFR calc non Af Amer: 71 mL/min — ABNORMAL LOW (ref >90)
Glucose, Bld: 129 mg/dL — ABNORMAL HIGH (ref 70–99)
Potassium: 4.2 mEq/L (ref 3.5–5.1)
Sodium: 133 mEq/L — ABNORMAL LOW (ref 135–145)

## 2013-05-03 LAB — CBC
HCT: 32.2 % — ABNORMAL LOW (ref 39.0–52.0)
Hemoglobin: 11 g/dL — ABNORMAL LOW (ref 13.0–17.0)
MCV: 89.9 fL (ref 78.0–100.0)
RBC: 3.58 MIL/uL — ABNORMAL LOW (ref 4.22–5.81)
WBC: 12.1 10*3/uL — ABNORMAL HIGH (ref 4.0–10.5)

## 2013-05-03 MED ORDER — LACTULOSE 10 GM/15ML PO SOLN: 20.0000 g | Freq: Two times a day (BID) | ORAL | Status: DC | PRN

## 2013-05-03 MED ORDER — ALBUTEROL SULFATE (5 MG/ML) 0.5% IN NEBU
2.5000 mg | INHALATION_SOLUTION | RESPIRATORY_TRACT | Status: DC | PRN
  Administered 2013-05-03 – 2013-05-04 (×4): 2.5 mg via RESPIRATORY_TRACT
  Filled 2013-05-03 (×4): qty 0.5

## 2013-05-03 NOTE — Progress Notes (Signed)
    Subjective:  Patient reports pain as mild  Objective:   VITALS:   Filed Vitals:   05/02/13 1300 05/02/13 2023 05/03/13 0522 05/03/13 2003  BP: 103/44 108/52 176/85 122/65  Pulse: 64 84 93 84  Temp: 98.1 F (36.7 C) 99.1 F (37.3 C) 97.8 F (36.6 C) 98 F (36.7 C)  TempSrc:  Oral Oral Oral  Resp: 17 16 18 18   Height:      Weight:      SpO2: 96% 94% 96% 95%    Physical Exam  Dressing: C/D/I  Compartments soft  SILT DP/SP/S/S/T, 2+DP, +TA/GS/EHL  LABS  Results for orders placed during the hospital encounter of 04/30/13 (from the past 24 hour(s))  CBC     Status: Abnormal   Collection Time    05/03/13  5:00 AM      Result Value Range   WBC 12.1 (*) 4.0 - 10.5 K/uL   RBC 3.58 (*) 4.22 - 5.81 MIL/uL   Hemoglobin 11.0 (*) 13.0 - 17.0 g/dL   HCT 82.9 (*) 56.2 - 13.0 %   MCV 89.9  78.0 - 100.0 fL   MCH 30.7  26.0 - 34.0 pg   MCHC 34.2  30.0 - 36.0 g/dL   RDW 86.5  78.4 - 69.6 %   Platelets 121 (*) 150 - 400 K/uL  BASIC METABOLIC PANEL     Status: Abnormal   Collection Time    05/03/13  5:00 AM      Result Value Range   Sodium 133 (*) 135 - 145 mEq/L   Potassium 4.2  3.5 - 5.1 mEq/L   Chloride 102  96 - 112 mEq/L   CO2 23  19 - 32 mEq/L   Glucose, Bld 129 (*) 70 - 99 mg/dL   BUN 17  6 - 23 mg/dL   Creatinine, Ser 2.95  0.50 - 1.35 mg/dL   Calcium 8.0 (*) 8.4 - 10.5 mg/dL   GFR calc non Af Amer 71 (*) >90 mL/min   GFR calc Af Amer 83 (*) >90 mL/min     Assessment/Plan: 2 Days Post-Op   Principal Problem:   Hip fracture Active Problems:   Cancer of dome of urinary bladder   UTI (urinary tract infection)   Clavicle fracture   PLAN: Weight Bearing: WBAT, post precautions  Dressings: change prior to d/c to methalex VTE prophylaxis: SCD and ASA 325 Dispo: SNF vs. AIR   Brailyn Killion, D 05/03/2013, 9:12 PM   Margarita Rana, MD Cell [phone]

## 2013-05-03 NOTE — Progress Notes (Signed)
Patient with noted wheezing upon exertion getting up with PT.  Dr. Merri Ray aware, new order for PRN albuterol.  Nursing will continue to monitor.

## 2013-05-03 NOTE — Progress Notes (Signed)
Physical Therapy Treatment Patient Details Name: Omar [name] MRN: [id number] DOB: 04-16-1916 Today's Date: 05/03/2013 Time: 1191-4782 PT Time Calculation (min): 32 min  PT Assessment / Plan / Recommendation  History of Present Illness s/p Fall resulting in Lt hip fx; s/p Lt THA; pt also sustained Lt Clavicle fx and is NWB on L UE   PT Comments   Pt progressing towards physical therapy goals. Hemiwalker use requires frequent verbal cues and mod assist for placement prior to advancing feet for safety. Clarification received about NWB status on LUE.  Follow Up Recommendations  SNF;Supervision/Assistance - 24 hour     Does the patient have the potential to tolerate intense rehabilitation     Barriers to Discharge        Equipment Recommendations  None recommended by PT    Recommendations for Other Services    Frequency Min 3X/week   Progress towards PT Goals Progress towards PT goals: Progressing toward goals  Plan Current plan remains appropriate    Precautions / Restrictions Precautions Precautions: Fall;Posterior Hip Required Braces or Orthoses: Sling Restrictions Weight Bearing Restrictions: Yes LUE Weight Bearing: Non weight bearing LLE Weight Bearing: Weight bearing as tolerated   Pertinent Vitals/Pain 7/10. Nursing present in room at end of session.    Mobility  Bed Mobility Bed Mobility: Supine to Sit;Sitting - Scoot to Edge of Bed Supine to Sit: 3: Mod assist;With rails;HOB elevated Sitting - Scoot to Edge of Bed: 4: Min assist Details for Bed Mobility Assistance: Assist for L LE movement to EOB. Pt cued for posterior hip precautions and hand placement on bed rails for support. Increased time to complete transfer to EOB. Transfers Transfers: Sit to Stand;Stand to Sit Sit to Stand: 1: +2 Total assist;From bed;With upper extremity assist Sit to Stand: Patient Percentage: 50% Stand to Sit: 1: +2 Total assist;To chair/3-in-1;With upper extremity assist Stand  to Sit: Patient Percentage: 50% Details for Transfer Assistance: VC's for hand placement on seated surface and for improved posture when at full stand.  Ambulation/Gait Ambulation/Gait Assistance: 1: +2 Total assist Ambulation/Gait: Patient Percentage: 40% Ambulation Distance (Feet): 3 Feet Assistive device: Hemi-walker Ambulation/Gait Assistance Details: Pt required frequent cueing for sequencing and safety awareness, and mod assist for hemi-walker placement prior to advancing LE's.  Gait Pattern: Step-to pattern;Decreased stride length Gait velocity: decreased    Exercises General Exercises - Lower Extremity Ankle Circles/Pumps: 10 reps Quad Sets: 10 reps Long Arc Quad: 10 reps Heel Slides: 10 reps (small ROM) Hip ABduction/ADduction: 10 reps (isometric adduction)   PT Diagnosis:    PT Problem List:   PT Treatment Interventions:     PT Goals (current goals can now be found in the care plan section) Acute Rehab PT Goals Patient Stated Goal: to get back to being independent  PT Goal Formulation: With patient/family Time For Goal Achievement: 05/09/13 Potential to Achieve Goals: Good  Visit Information  Last PT Received On: 05/03/13 Assistance Needed: +2 History of Present Illness: s/p Fall resulting in Lt hip fx; s/p Lt THA; pt also sustained Lt Clavicle fx and is NWB on L UE    Subjective Data  Subjective: "I feel okay today." Patient Stated Goal: to get back to being independent    Cognition  Cognition Arousal/Alertness: Awake/alert Behavior During Therapy: WFL for tasks assessed/performed Overall Cognitive Status: Within Functional Limits for tasks assessed    Balance  Balance Balance Assessed: Yes Static Sitting Balance Static Sitting - Balance Support: Feet supported;Right upper extremity supported Static Sitting -  Level of Assistance: 3: Mod assist (progressing to stand by assist when sitting EOB.)  End of Session PT - End of Session Equipment Utilized During  Treatment: Gait belt;Other (comment) (LUE sling) Activity Tolerance: Patient tolerated treatment well Patient left: in chair;with call bell/phone within reach;with family/visitor present Nurse Communication: Mobility status   GP     Ruthann Cancer 05/03/2013, 11:57 AM  Ruthann Cancer, PT, DPT Acute Rehabilitation Services [phone]

## 2013-05-03 NOTE — Discharge Summary (Signed)
Physician Discharge Summary  Omar [name] ZOX:[id number] DOB: 03-14-1916 DOA: 04/30/2013  PCP: Kaleen Mask, MD  Admit date: 04/30/2013 Discharge date: 05/03/2013  Time spent: 35 minutes  Recommendations for Outpatient Follow-up:  1. Please followup on a CBC in one week  Discharge Diagnoses:  Principal Problem:   Hip fracture Active Problems:   Cancer of dome of urinary bladder   UTI (urinary tract infection)   Clavicle fracture    Discharge Condition: Stable/improved  Diet recommendation: Heart healthy diet  Filed Weights   04/30/13 1146  Weight: 81.829 kg (180 lb 6.4 oz)    History of present illness:  Omar [name] is a 79 y.o. male  With a hx of bladder cancer, asbestosis, prior hx of DVT with PE in 2011, completed anticoagulation and s/p IVC filter who presents to the ED with a mechanical fall while walking to get coffee on the AM of admission resulting in a L clavicular fx and L femoral neck fracture. Pt was found on the ground and brought to the ED for further work up. Pt denies syncope. Orthopedic surgery was consulted with recs for surgery on 10/21. The hospitalist was consulted for admission.  Hospital Course:  On presentation patient found to have a left femoral neck fracture with impaction and angulation at the fracture site. Orthopedic surgery was consulted as patient was seen and evaluated by Dr. Dayton Scrape. He was taken to the OR on 05/01/2013, undergoing arthroplasty of left hip fracture. Patient tolerated procedure well as there were no immediate complications. Postoperatively his hemoglobin did come down from 11.1 to 9.6. Patient however remained asymptomatic, was able to participate with physical therapy, tolerated by mouth intake, and overall had an uncomplicated postoperative course. He was found to have hyponatremia with a sodium of 132, remaining stable during this hospitalization, having a sodium level of 133 on day of discharge. Patient with  history of urinary tract infection, was maintained on Bactrim therapy. Given clinical stability, he was discharged on 05/03/2013 to skilled nursing facility to begin rehabilitation.  Procedures:  Arthroplasty performed on 05/01/2013 by Dr. Dayton Scrape  Consultations:  Orthopedic surgery  Discharge Exam: Filed Vitals:   05/03/13 0522  BP: 176/85  Pulse: 93  Temp: 97.8 F (36.6 C)  Resp: 18    General: Patient in no acute distress, awake alert, sitting up having his breakfast. He has no complaints for me this morning. Cardiovascular: Regular rate and rhythm normal S1-S2,2/6 systolic ejection murmur Respiratory: Lungs are clear to auscultation, normal respiratory effort, require supplemental oxygen Abdomen: Soft nontender nondistended positive bowel sounds Extremities: No cyanosis clubbing or edema, surgical incision site appearing clean  Discharge Instructions  Discharge Orders   Future Appointments Provider Department Dept Phone   05/29/2013 10:00 AM Maryln Gottron, MD [facility] [phone]   07/26/2013 12:45 PM Delcie Roch [facility] [phone]   07/26/2013 1:15 PM Benjiman Core, MD [facility] [phone]   Future Orders Complete By Expires   Call MD for:  extreme fatigue  As directed    Call MD for:  persistant nausea and vomiting  As directed    Call MD for:  redness, tenderness, or signs of infection (pain, swelling, redness, odor or green/yellow discharge around incision site)  As directed    Call MD for:  temperature >100.4  As directed    Diet - low sodium heart healthy  As directed    Increase activity  slowly  As directed    Posterior total hip precautions  As directed    Weight bearing as tolerated  As directed    Questions:     Laterality:     Extremity:         Medication List    STOP taking these medications       aspirin 81 MG tablet  Replaced by:   aspirin EC 325 MG tablet      TAKE these medications       acidophilus Caps capsule  Take 1 capsule by mouth daily.     aspirin EC 325 MG tablet  Take 1 tablet (325 mg total) by mouth daily.     CRANBERRY JUICE EXTRACT PO  Take by mouth daily.     finasteride 5 MG tablet  Commonly known as:  PROSCAR  Take 5 mg by mouth daily.     Fish Oil 1000 MG Caps  Take by mouth daily.     HYDROcodone-acetaminophen 5-325 MG per tablet  Commonly known as:  NORCO  Take 2 tablets by mouth every 4 (four) hours as needed for pain.     lactulose 10 GM/15ML solution  Commonly known as:  CHRONULAC  Take 30 mLs (20 g total) by mouth 2 (two) times daily as needed.     SAW PALMETTO PO  Take 1 tablet by mouth daily.     sulfamethoxazole-trimethoprim 800-160 MG per tablet  Commonly known as:  BACTRIM DS  Take 1 tablet by mouth 2 (two) times daily.       Allergies  Allergen Reactions  . Benadryl [Diphenhydramine Hcl] Other (See Comments)    Urinary retention  . Latex Other (See Comments)    blisters       Follow-up Information   Follow up with MURPHY, TIMOTHY, D, MD In 2 weeks.   Specialty:  Orthopedic Surgery   Contact information:   [address] [place] [address] [phone]        The results of significant diagnostics from this hospitalization (including imaging, microbiology, ancillary and laboratory) are listed below for reference.    Significant Diagnostic Studies: Dg Chest 1 View  04/30/2013   CLINICAL DATA:  Bladder cancer. Fall with left upper chest pain.  EXAM: CHEST - 1 VIEW  COMPARISON:  None.  FINDINGS: Left medial clavicular fracture noted. Extensive bilateral pleural calcification. Borderline cardiomegaly. Thoracic spondylosis.  No pneumothorax or pleural effusion observed.  IMPRESSION: 1. Acute left medial clavicular fracture. 2. Extensive bilateral pleural calcification. 3. Borderline cardiomegaly.   Electronically Signed   By: Herbie Baltimore M.D.   On: 04/30/2013 09:33   Dg Clavicle Left  04/30/2013   CLINICAL DATA:  Fall. Left upper chest pain. Medial clavicular swelling.  EXAM: LEFT CLAVICLE - 2+ VIEWS  COMPARISON:  None.  FINDINGS: Cortical discontinuity medially in the clavicle is compatible with fracture. The patient had a CT scan that included this region 1 week ago and I do not see an underlying lytic or destructive lesion to suggest that this fracture is a pathologic fracture.  Extensive pleural calcification on the left. Degenerative glenohumeral joint findings on the left. Mild degenerative AC joint arthropathy.  IMPRESSION: 1. Medial clavicular metaphyseal fracture on the left. 2. Calcified pleural plaques. 3. Degenerative glenohumeral arthropathy.   Electronically Signed   By: Herbie Baltimore M.D.   On: 04/30/2013 09:30   Dg Hip Complete Left  04/30/2013   CLINICAL DATA:  Traumatic injury with  pain  EXAM: LEFT HIP - COMPLETE 2+ VIEW  COMPARISON:  None.  FINDINGS: There is a left femoral neck fracture with impaction and angulation at the fracture site. The pelvic ring is intact. No other focal abnormality is seen.  IMPRESSION: Left femoral neck fracture.   Electronically Signed   By: Alcide Clever M.D.   On: 04/30/2013 09:30   Ct Chest W Contrast  04/24/2013   CLINICAL DATA:  Hematuria. Recent urinary tract infection. Bladder carcinoma.  EXAM: CT CHEST, ABDOMEN, AND PELVIS WITH CONTRAST  TECHNIQUE: Multidetector CT imaging of the chest, abdomen and pelvis was performed following the standard protocol during bolus administration of intravenous contrast.  CONTRAST:  OMNIPAQUE IOHEXOL 300 MG/ML  SOLN  COMPARISON:  Chest CT on 10/17/2009 and AP CT on 12/06/2012  FINDINGS: CT CHEST FINDINGS  No evidence of mediastinal or hilar masses. No lymphadenopathy identified within the thorax. Dilated fluid-filled esophagus is stable.  Extensive calcified pleural plaque is again seen bilaterally, without evidence of pleural mass  or effusion. Multiple small subcentimeter noncalcified pulmonary nodules are seen bilaterally which are new since previous study and suspicious for early pulmonary metastases. No evidence of chest wall mass or suspicious bone lesions.  CT ABDOMEN AND PELVIS FINDINGS  Numerous tiny gallstones are again seen, without evidence of cholecystitis or biliary ductal dilatation. Tiny hepatic cysts is stable but no liver masses are identified. The pancreas, spleen, adrenal glands, and kidneys remain normal in appearance. IVC filter remains in place.  No soft tissue masses or lymphadenopathy identified within the abdomen or pelvis. Markedly enlarged prostate gland is again demonstrated with mass effect on bladder base. Diffuse bladder wall thickening is seen without evidence of focal bladder mass. Bladder diverticula again demonstrated, with an 8 mm calculus seen in a left-sided Hutch diverticulum.  No evidence of inflammatory process or abnormal fluid collections. No evidence of bowel wall thickening, dilatation, or hernia. No suspicious bone lesions identified.  IMPRESSION: New subcentimeter bilateral pulmonary nodules, suspicious for early pulmonary metastases.  Stable bilateral calcified pleural plaque, consistent with asbestos related pleural disease.  No evidence of recurrent or metastatic carcinoma within the abdomen or pelvis.  Stable markedly enlarged prostate and findings of chronic bladder outlet obstruction. 8 mm bladder calculus also noted in left sided Hutch diverticulum.  Cholelithiasis. No radiographic evidence of cholecystitis.   Electronically Signed   By: Myles Rosenthal M.D.   On: 04/24/2013 14:22   Ct Abdomen Pelvis W Contrast  04/24/2013   CLINICAL DATA:  Hematuria. Recent urinary tract infection. Bladder carcinoma.  EXAM: CT CHEST, ABDOMEN, AND PELVIS WITH CONTRAST  TECHNIQUE: Multidetector CT imaging of the chest, abdomen and pelvis was performed following the standard protocol during bolus  administration of intravenous contrast.  CONTRAST:  OMNIPAQUE IOHEXOL 300 MG/ML  SOLN  COMPARISON:  Chest CT on 10/17/2009 and AP CT on 12/06/2012  FINDINGS: CT CHEST FINDINGS  No evidence of mediastinal or hilar masses. No lymphadenopathy identified within the thorax. Dilated fluid-filled esophagus is stable.  Extensive calcified pleural plaque is again seen bilaterally, without evidence of pleural mass or effusion. Multiple small subcentimeter noncalcified pulmonary nodules are seen bilaterally which are new since previous study and suspicious for early pulmonary metastases. No evidence of chest wall mass or suspicious bone lesions.  CT ABDOMEN AND PELVIS FINDINGS  Numerous tiny gallstones are again seen, without evidence of cholecystitis or biliary ductal dilatation. Tiny hepatic cysts is stable but no liver masses are identified. The pancreas, spleen, adrenal  glands, and kidneys remain normal in appearance. IVC filter remains in place.  No soft tissue masses or lymphadenopathy identified within the abdomen or pelvis. Markedly enlarged prostate gland is again demonstrated with mass effect on bladder base. Diffuse bladder wall thickening is seen without evidence of focal bladder mass. Bladder diverticula again demonstrated, with an 8 mm calculus seen in a left-sided Hutch diverticulum.  No evidence of inflammatory process or abnormal fluid collections. No evidence of bowel wall thickening, dilatation, or hernia. No suspicious bone lesions identified.  IMPRESSION: New subcentimeter bilateral pulmonary nodules, suspicious for early pulmonary metastases.  Stable bilateral calcified pleural plaque, consistent with asbestos related pleural disease.  No evidence of recurrent or metastatic carcinoma within the abdomen or pelvis.  Stable markedly enlarged prostate and findings of chronic bladder outlet obstruction. 8 mm bladder calculus also noted in left sided Hutch diverticulum.  Cholelithiasis. No radiographic  evidence of cholecystitis.   Electronically Signed   By: Myles Rosenthal M.D.   On: 04/24/2013 14:22   Dg Pelvis Portable  05/01/2013   CLINICAL DATA:  Bipolar left hip arthroplasty.  EXAM: PORTABLE PELVIS  COMPARISON:  04/24/2013  FINDINGS: Left hip bipolar prosthesis noted without fracture or complicating feature.  Lower lumbar spondylosis. IVC filter partially visualized.  IMPRESSION: 1. Left hip bipolar prosthesis without complicating feature. .   Electronically Signed   By: Herbie Baltimore M.D.   On: 05/01/2013 14:03    Microbiology: Recent Results (from the past 240 hour(s))  SURGICAL PCR SCREEN     Status: None   Collection Time    04/30/13 11:13 PM      Result Value Range Status   MRSA, PCR NEGATIVE  NEGATIVE Final   Staphylococcus aureus NEGATIVE  NEGATIVE Final   Comment:            The Xpert SA Assay (FDA     approved for NASAL specimens     in patients over 62 years of age),     is one component of     a comprehensive surveillance     program.  Test performance has     been validated by [employer] for patients greater     than or equal to 66 year old.     It is not intended     to diagnose infection nor to     guide or monitor treatment.     Labs: Basic Metabolic Panel:  Recent Labs Lab 04/30/13 0920 05/01/13 0405 05/02/13 0517 05/03/13 0500  NA 135 132* 131* 133*  K 4.0 4.2 4.2 4.2  CL 102 101 98 102  CO2 25 24 24 23   GLUCOSE 165* 133* 145* 129*  BUN 13 13 16 17   CREATININE 1.09 0.96 0.89 0.86  CALCIUM 8.8 7.9* 7.6* 8.0*   Liver Function Tests: No results found for this basename: AST, ALT, ALKPHOS, BILITOT, PROT, ALBUMIN,  in the last 168 hours No results found for this basename: LIPASE, AMYLASE,  in the last 168 hours No results found for this basename: AMMONIA,  in the last 168 hours CBC:  Recent Labs Lab 04/30/13 0920 05/01/13 0405 05/02/13 0517 05/03/13 0500  WBC 14.5* 9.4 9.4 12.1*  NEUTROABS 13.2*  --   --   --   HGB 12.2* 11.1* 9.6*  11.0*  HCT 35.6* 32.6* 27.9* 32.2*  MCV 90.6 91.3 90.0 89.9  PLT 169 131* 127* 121*   Cardiac Enzymes: No results found for this basename: CKTOTAL, CKMB,  CKMBINDEX, TROPONINI,  in the last 168 hours BNP: BNP (last 3 results) No results found for this basename: PROBNP,  in the last 8760 hours CBG: No results found for this basename: GLUCAP,  in the last 168 hours     Signed:  Jeralyn Bennett  [facility] 05/03/2013, 9:54 AM

## 2013-05-04 LAB — CBC
HCT: 25.5 % — ABNORMAL LOW (ref 39.0–52.0)
Hemoglobin: 9.2 g/dL — ABNORMAL LOW (ref 13.0–17.0)
MCH: 31.9 pg (ref 26.0–34.0)
MCHC: 36.1 g/dL — ABNORMAL HIGH (ref 30.0–36.0)
MCV: 88.5 fL (ref 78.0–100.0)
RBC: 2.88 MIL/uL — ABNORMAL LOW (ref 4.22–5.81)

## 2013-05-04 MED ORDER — ALBUTEROL SULFATE (5 MG/ML) 0.5% IN NEBU: 2.5000 mg | INHALATION_SOLUTION | RESPIRATORY_TRACT | Status: DC | PRN

## 2013-05-04 NOTE — Clinical Social Work Psychosocial (Signed)
Clinical Social Work Department  BRIEF PSYCHOSOCIAL ASSESSMENT  Patient: Omar [name]  Account Number: [id number]  Admit date: 04/30/13 Clinical Social Worker Sabino Niemann, MSW Date/Time: 05/02/13 3:56 PM Referred by: Physician Date Referred: 05/01/13 Referred for   SNF Placement   Other Referral:  Interview type: Patient  Other interview type: PSYCHOSOCIAL DATA  Living Status: Family Admitted from facility:  Level of care:  Primary support name: Omar [name] Primary support relationship to patient: Son Degree of support available:  Strong and vested  CURRENT CONCERNS  Current Concerns   Post-Acute Placement   Other Concerns:  SOCIAL WORK ASSESSMENT / PLAN  CSW met with pt and patient's son re: PT recommendation for SNF.   Pt lives with family  CSW explained placement process and answered questions.   Pt reports [facility]  as his preference    CSW completed FL2 and initiated SNF search.     Assessment/plan status: Information/Referral to [facility]  Other assessment/ plan:  Information/referral to community resources:  SNF   PTAR  PATIENT'S/FAMILY'S RESPONSE TO PLAN OF CARE:  Pt  reports he is agreeable to ST SNF in order to increase strength and independence with mobility prior to returning home  Pt verbalized understanding of placement process and appreciation for CSW assist.   Sabino Niemann, MSW, LCSWA [phone]

## 2013-05-04 NOTE — Progress Notes (Signed)
Upon assessment, patient presented with mild expiratory wheezes bilaterally. With slight movement and activity, the wheezing got progressively louder. Throughout shift, pt received two PRN Albuterol breathing treatments. Upon reassessment, after the completion of each treatment, symptoms significantly improved. Will continue to monitor closely. Fraser Din, RN

## 2013-05-04 NOTE — Progress Notes (Signed)
Discharge orders were written yesterday for patient to be discharged to skilled nursing facility. Unfortunately bed was not available yesterday as he was kept overnight. Nursing staff noting a few expiratory wheezes which improved with albuterol nebs. Otherwise patient satting 90s on room air, without evidence of respiratory distress. He denies SOB. Patient having no complaints on my examination this morning. Vitals reviewed. Had a few right basilar crackles, otherwise normal inspiratory effort. RRR, Nl S1 S2, benign abdomen. He appears well, tolerated his breakfast.   Plan to discharge him to skilled nursing facility today for rehabilitation.

## 2013-05-04 NOTE — Progress Notes (Signed)
Clinical social worker assisted with patient discharge to skilled nursing facility, [facility].  CSW addressed all family questions and concerns. CSW copied chart and added all important documents. CSW also set up patient transportation with [employer]. Clinical Social Worker will sign off for now as social work intervention is no longer needed.    Sabino Niemann, MSW, [employer] [phone]

## 2013-05-04 NOTE — Progress Notes (Signed)
    Subjective:  Patient reports pain as mild  Objective:   VITALS:   Filed Vitals:   05/03/13 2003 05/04/13 0000 05/04/13 0400 05/04/13 0519  BP: 122/65   154/70  Pulse: 84   109  Temp: 98 F (36.7 C)   98.8 F (37.1 C)  TempSrc: Oral   Oral  Resp: 18 18 18 18   Height:      Weight:      SpO2: 95% 96% 95% 95%    Physical Exam  Dressing: C/D/I  Compartments soft  SILT DP/SP/S/S/T, 2+DP, +TA/GS/EHL  LABS  Results for orders placed during the hospital encounter of 04/30/13 (from the past 24 hour(s))  CBC     Status: Abnormal   Collection Time    05/04/13  5:44 AM      Result Value Range   WBC 10.9 (*) 4.0 - 10.5 K/uL   RBC 2.88 (*) 4.22 - 5.81 MIL/uL   Hemoglobin 9.2 (*) 13.0 - 17.0 g/dL   HCT 16.1 (*) 09.6 - 04.5 %   MCV 88.5  78.0 - 100.0 fL   MCH 31.9  26.0 - 34.0 pg   MCHC 36.1 (*) 30.0 - 36.0 g/dL   RDW 40.9  81.1 - 91.4 %   Platelets 123 (*) 150 - 400 K/uL     Assessment/Plan: 3 Days Post-Op   Principal Problem:   Hip fracture Active Problems:   Cancer of dome of urinary bladder   UTI (urinary tract infection)   Clavicle fracture   PLAN: Weight Bearing: WBAT, post precautions  Dressings: change prior to d/c to methalex VTE prophylaxis: SCD and ASA 325 Dispo: SNF vs. AIR   Maicee Ullman, D 05/04/2013, 7:57 AM   Margarita Rana, MD Cell [phone]

## 2013-05-07 ENCOUNTER — Encounter (HOSPITAL_COMMUNITY): Payer: Self-pay | Admitting: Orthopedic Surgery

## 2013-05-17 ENCOUNTER — Other Ambulatory Visit: Payer: Self-pay

## 2013-05-25 ENCOUNTER — Encounter: Payer: Self-pay | Admitting: *Deleted

## 2013-05-29 ENCOUNTER — Ambulatory Visit
Admission: RE | Admit: 2013-05-29 | Discharge: 2013-05-29 | Disposition: A | Discharge status: Home / Self Care | Payer: Medicare Other | Source: Ambulatory Visit | Attending: Radiation Oncology | Admitting: Radiation Oncology

## 2013-05-29 ENCOUNTER — Encounter: Payer: Self-pay | Admitting: Radiation Oncology

## 2013-05-29 VITALS — BP 120/52 | HR 77 | Temp 97.6°F | Resp 20

## 2013-05-29 DIAGNOSIS — C671 Malignant neoplasm of dome of bladder: Secondary | ICD-10-CM

## 2013-05-29 NOTE — Progress Notes (Signed)
CC: Dr. Su Grand, Dr. 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 Unfortunately, he fell and fractured his left clavicle and femur approximately 1 month ago. He is getting physical therapy at [facility]. He is doing well from a GU standpoint. He urinate 3 times during the day and approximately 3 times at night. He denies hematuria and dysuria. No GI difficulties. He saw Dr. Brunilda Payor yesterday and apparently had cystoscopy. He was given a good report and he'll return to see Dr. Brunilda Payor again in approximately 3 months.  Physical examination: Alert and oriented. Filed Vitals:   05/29/13 1004  BP: 120/52  Pulse: 77  Temp: 97.6 F (36.4 C)  Resp: 20   Impression: Satisfactory progress. I am pleased with his cystoscopy findings and will obtain Dr. Madilyn Hook report in the near future.  Plan: Followup visit with Dr. Brunilda Payor in 3 months. I've not scheduled the patient for a formal followup visit and I asked that Dr. Brunilda Payor keep me posted on his progress.

## 2013-05-29 NOTE — Progress Notes (Signed)
folow up bladder cancr rad txs 01/02/13-02/19/13,  Nocturia x2, broke left clavicle and left femur, fell at home,  04/30/13,;surgery 05/01/13 left femur,  on antibiotics for where incision is on left femur,  Till 06/06/13,  is getting physical therapy at [facility], saw DR.NESI yesterday, no UTI, pain with walking with walker, unsteady, but getting better ,  Bowels moving okay, take mom  10:11 AM

## 2013-07-26 ENCOUNTER — Other Ambulatory Visit (HOSPITAL_BASED_OUTPATIENT_CLINIC_OR_DEPARTMENT_OTHER): Payer: Medicare Other

## 2013-07-26 ENCOUNTER — Encounter: Payer: Self-pay | Admitting: Oncology

## 2013-07-26 ENCOUNTER — Ambulatory Visit (HOSPITAL_BASED_OUTPATIENT_CLINIC_OR_DEPARTMENT_OTHER): Payer: Medicare Other | Admitting: Oncology

## 2013-07-26 ENCOUNTER — Telehealth: Payer: Self-pay | Admitting: Oncology

## 2013-07-26 ENCOUNTER — Other Ambulatory Visit: Payer: Self-pay | Admitting: Oncology

## 2013-07-26 VITALS — BP 109/53 | HR 71 | Temp 96.7°F | Resp 17 | Ht 69.0 in | Wt 163.6 lb

## 2013-07-26 DIAGNOSIS — C671 Malignant neoplasm of dome of bladder: Secondary | ICD-10-CM

## 2013-07-26 DIAGNOSIS — D649 Anemia, unspecified: Secondary | ICD-10-CM

## 2013-07-26 LAB — CBC WITH DIFFERENTIAL/PLATELET
BASO%: 0.5 % (ref 0.0–2.0)
Basophils Absolute: 0 10*3/uL (ref 0.0–0.1)
EOS%: 2.3 % (ref 0.0–7.0)
Eosinophils Absolute: 0.2 10*3/uL (ref 0.0–0.5)
HCT: 32.9 % — ABNORMAL LOW (ref 38.4–49.9)
HEMOGLOBIN: 10.8 g/dL — AB (ref 13.0–17.1)
LYMPH#: 1 10*3/uL (ref 0.9–3.3)
LYMPH%: 11.1 % — ABNORMAL LOW (ref 14.0–49.0)
MCH: 28.3 pg (ref 27.2–33.4)
MCHC: 32.8 g/dL (ref 32.0–36.0)
MCV: 86.4 fL (ref 79.3–98.0)
MONO#: 0.5 10*3/uL (ref 0.1–0.9)
MONO%: 5.2 % (ref 0.0–14.0)
NEUT#: 7.2 10*3/uL — ABNORMAL HIGH (ref 1.5–6.5)
NEUT%: 80.9 % — ABNORMAL HIGH (ref 39.0–75.0)
Platelets: 281 10*3/uL (ref 140–400)
RBC: 3.81 10*6/uL — ABNORMAL LOW (ref 4.20–5.82)
RDW: 16.1 % — AB (ref 11.0–14.6)
WBC: 8.9 10*3/uL (ref 4.0–10.3)

## 2013-07-26 LAB — COMPREHENSIVE METABOLIC PANEL (CC13)
ALBUMIN: 2.5 g/dL — AB (ref 3.5–5.0)
ALT: 8 U/L (ref 0–55)
AST: 15 U/L (ref 5–34)
Alkaline Phosphatase: 89 U/L (ref 40–150)
Anion Gap: 10 mEq/L (ref 3–11)
BUN: 29.3 mg/dL — ABNORMAL HIGH (ref 7.0–26.0)
CALCIUM: 8.9 mg/dL (ref 8.4–10.4)
CHLORIDE: 102 meq/L (ref 98–109)
CO2: 28 mEq/L (ref 22–29)
Creatinine: 1 mg/dL (ref 0.7–1.3)
Glucose: 109 mg/dl (ref 70–140)
POTASSIUM: 4.2 meq/L (ref 3.5–5.1)
SODIUM: 140 meq/L (ref 136–145)
TOTAL PROTEIN: 6.5 g/dL (ref 6.4–8.3)
Total Bilirubin: 0.4 mg/dL (ref 0.20–1.20)

## 2013-07-26 NOTE — Progress Notes (Signed)
Hematology and Oncology Follow Up Visit  Omar [name] [id number] 06/24/1916 80 y.o. 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   Prior Therapy: He underwent a TUR of a bladder tumor in December 2011. He was found to have a low-grade tumor. In January 2012 he was found to have a tumor along the anterior dome and this was also low-grade although there was a focus of high-grade carcinoma. He represented with intermittent hematuria. Cystoscopy in March of 2014 showed recurrent bladder tumor along the left lateral wall of his bladder measuring approximately 5 cm. He underwent a formal TURB by Dr. Janice Norrie on 10/20/2012. He described a tumor along the left lateral wall of the bladder measuring approximately 4 cm. He performed a subtotal resection with difficulty secondary to his anatomy and tumor location. On review of his pathology he was found to have high-grade papillary and invasive urothelial carcinoma. Muscularis propria was not identified. His staging CT scan of the abdomen and pelvis on 12/06/2012 showed a 2.3 x 1.7 cm enhancing lesion along the left bladder dome suspicious for a primary bladder neoplasm.  Started her radiation to his bladder tumor beginning on June 23 14. He was due to begin Xeloda as a radiosensitizing agent, but his insurance would not cover this medication. He completed treatment on 02/19/2013.   Current therapy: Observation and follow up.   Interim History:  Omar [name] returns for routine followup with his son. This is a nice man who completed radiation therapy to his bladder tumor without complications in August of 2014. Since his last visit, he has sustained a fall and a fracture and required a brief stent at rehabilitation but he is currently at home. He reports pink tinged urine, but no other bleeding is noted. Denies chest pain, shortness of breath,  abdominal pain. Denies nausea and vomiting. No diarrhea. He and let's without any major difficulty. He uses a cane and has not had any recent falls. No new illnesses or hospitalizations. Is not reporting any change in his performance status or activity level. He is not reporting any pulmonary symptoms of shortness of breath or difficulty breathing. He does not report any new genitourinary complaints.  Medications: I have reviewed the patient's current medications.  Current Outpatient Prescriptions  Medication Sig Dispense Refill  . acetaminophen (TYLENOL) 500 MG tablet Take 500 mg by mouth every 6 (six) hours as needed.      Marland Kitchen acidophilus (RISAQUAD) CAPS Take 1 capsule by mouth daily.      Marland Kitchen aspirin EC 325 MG tablet Take 1 tablet (325 mg total) by mouth daily.  30 tablet  0  . CRANBERRY JUICE EXTRACT PO Take by mouth daily.      . finasteride (PROSCAR) 5 MG tablet Take 5 mg by mouth daily.      . furosemide (LASIX) 40 MG tablet Take 40 mg by mouth daily.      . magnesium hydroxide (MILK OF MAGNESIA) 400 MG/5ML suspension Take by mouth daily as needed for mild constipation.      . Omega-3 Fatty Acids (FISH OIL) 1000 MG CAPS Take by mouth daily.      . Saw Palmetto, Serenoa repens, (SAW PALMETTO PO) Take 1 tablet by mouth daily.        No current facility-administered medications for this visit.     Allergies:  Allergies  Allergen Reactions  . Benadryl [Diphenhydramine Hcl] Other (  See Comments)    Urinary retention  . Latex Other (See Comments)    blisters    Past Medical History, Surgical history, Social history, and Family History were reviewed and updated.  Remaining ROS negative.  Physical Exam: Blood pressure 109/53, pulse 71, temperature 96.7 F (35.9 C), temperature source Oral, resp. rate 17, height 5\' 9"  (1.753 m), weight 163 lb 9.6 oz (74.208 kg). ECOG: 1 General appearance: alert, cooperative and no distress Head: Normocephalic, without obvious abnormality,  atraumatic Neck: no adenopathy, no carotid bruit, no JVD, supple, symmetrical, trachea midline and thyroid not enlarged, symmetric, no tenderness/mass/nodules Lymph nodes: Cervical, supraclavicular, and axillary nodes normal. Heart:regular rate and rhythm, S1, S2 normal, no murmur, click, rub or gallop Lung:chest clear, no wheezing, rales, normal symmetric air entry, no tachypnea, retractions or cyanosis Abdomen: soft, non-tender, without masses or organomegaly EXT:no erythema, induration, or nodules   Lab Results: Lab Results  Component Value Date   WBC 8.9 07/26/2013   HGB 10.8* 07/26/2013   HCT 32.9* 07/26/2013   MCV 86.4 07/26/2013   PLT 281 07/26/2013     Chemistry      Component Value Date/Time   NA 140 07/26/2013 1348   NA 133* 05/03/2013 0500   K 4.2 07/26/2013 1348   K 4.2 05/03/2013 0500   CL 102 05/03/2013 0500   CL 106 12/22/2012 1000   CO2 28 07/26/2013 1348   CO2 23 05/03/2013 0500   BUN 29.3* 07/26/2013 1348   BUN 17 05/03/2013 0500   CREATININE 1.0 07/26/2013 1348   CREATININE 0.86 05/03/2013 0500      Component Value Date/Time   CALCIUM 8.9 07/26/2013 1348   CALCIUM 8.0* 05/03/2013 0500   ALKPHOS 89 07/26/2013 1348   ALKPHOS 62 06/30/2010 0930   AST 15 07/26/2013 1348   AST 15 06/30/2010 0930   ALT 8 07/26/2013 1348   ALT 10 06/30/2010 0930   BILITOT 0.40 07/26/2013 1348   BILITOT 0.7 06/30/2010 0930        Impression and Plan: This is a 80 year old gentleman with the following issues: 1. High grade urothelial carcinoma. S/P radiation therapy to his bladder tumor. He did well without complications. CT scan from 04/24/2013 was discussed today and showed no evidence of clear cut cancer. He does have subcentimeter pulmonary nodules that are suspicious for metastasis but could also of a benign etiology. He is asymptomatic and we will continue  active surveillance also instructed him about the importance of a followup cystoscopies with Dr. Janice Norrie  on an intermittent  basis. 2. Mild anemia. Probably due to chronic disease and malignancy and may be due to mild hematuria. His hemoglobin is stable and no transfusion is indicated. 3. Followup. I have scheduled him back in 02/2014.    RSWNIO,EVOJJ 1/15/20152:31 PM

## 2013-07-26 NOTE — Telephone Encounter (Signed)
gave pt appt for lab and MD on july 2015

## 2013-10-26 ENCOUNTER — Encounter (HOSPITAL_COMMUNITY): Payer: Self-pay | Admitting: Emergency Medicine

## 2013-10-26 ENCOUNTER — Emergency Department (HOSPITAL_COMMUNITY): Payer: Medicare Other

## 2013-10-26 ENCOUNTER — Inpatient Hospital Stay (HOSPITAL_COMMUNITY)
Admission: EM | Admit: 2013-10-26 | Discharge: 2013-10-31 | DRG: 981 | Disposition: A | Discharge status: Skilled Nursing Facility | Payer: Medicare Other | Attending: Internal Medicine | Admitting: Internal Medicine

## 2013-10-26 DIAGNOSIS — S42009A Fracture of unspecified part of unspecified clavicle, initial encounter for closed fracture: Secondary | ICD-10-CM

## 2013-10-26 DIAGNOSIS — N400 Enlarged prostate without lower urinary tract symptoms: Secondary | ICD-10-CM | POA: Diagnosis present

## 2013-10-26 DIAGNOSIS — Z7982 Long term (current) use of aspirin: Secondary | ICD-10-CM

## 2013-10-26 DIAGNOSIS — Z96649 Presence of unspecified artificial hip joint: Secondary | ICD-10-CM

## 2013-10-26 DIAGNOSIS — N390 Urinary tract infection, site not specified: Secondary | ICD-10-CM | POA: Diagnosis not present

## 2013-10-26 DIAGNOSIS — B952 Enterococcus as the cause of diseases classified elsewhere: Secondary | ICD-10-CM | POA: Diagnosis not present

## 2013-10-26 DIAGNOSIS — Z85828 Personal history of other malignant neoplasm of skin: Secondary | ICD-10-CM

## 2013-10-26 DIAGNOSIS — Z888 Allergy status to other drugs, medicaments and biological substances status: Secondary | ICD-10-CM

## 2013-10-26 DIAGNOSIS — Z91040 Latex allergy status: Secondary | ICD-10-CM

## 2013-10-26 DIAGNOSIS — T3995XA Adverse effect of unspecified nonopioid analgesic, antipyretic and antirheumatic, initial encounter: Secondary | ICD-10-CM | POA: Diagnosis present

## 2013-10-26 DIAGNOSIS — R310 Gross hematuria: Secondary | ICD-10-CM | POA: Diagnosis present

## 2013-10-26 DIAGNOSIS — C671 Malignant neoplasm of dome of bladder: Secondary | ICD-10-CM | POA: Diagnosis present

## 2013-10-26 DIAGNOSIS — J61 Pneumoconiosis due to asbestos and other mineral fibers: Secondary | ICD-10-CM | POA: Diagnosis present

## 2013-10-26 DIAGNOSIS — K922 Gastrointestinal hemorrhage, unspecified: Secondary | ICD-10-CM

## 2013-10-26 DIAGNOSIS — Z66 Do not resuscitate: Secondary | ICD-10-CM | POA: Diagnosis present

## 2013-10-26 DIAGNOSIS — I440 Atrioventricular block, first degree: Secondary | ICD-10-CM | POA: Diagnosis not present

## 2013-10-26 DIAGNOSIS — D500 Iron deficiency anemia secondary to blood loss (chronic): Secondary | ICD-10-CM | POA: Diagnosis present

## 2013-10-26 DIAGNOSIS — Z86718 Personal history of other venous thrombosis and embolism: Secondary | ICD-10-CM

## 2013-10-26 DIAGNOSIS — N289 Disorder of kidney and ureter, unspecified: Secondary | ICD-10-CM | POA: Diagnosis not present

## 2013-10-26 DIAGNOSIS — D62 Acute posthemorrhagic anemia: Secondary | ICD-10-CM | POA: Diagnosis present

## 2013-10-26 DIAGNOSIS — K260 Acute duodenal ulcer with hemorrhage: Principal | ICD-10-CM | POA: Diagnosis present

## 2013-10-26 DIAGNOSIS — I498 Other specified cardiac arrhythmias: Secondary | ICD-10-CM | POA: Diagnosis not present

## 2013-10-26 DIAGNOSIS — H9190 Unspecified hearing loss, unspecified ear: Secondary | ICD-10-CM | POA: Diagnosis present

## 2013-10-26 DIAGNOSIS — R578 Other shock: Secondary | ICD-10-CM | POA: Diagnosis not present

## 2013-10-26 DIAGNOSIS — D649 Anemia, unspecified: Secondary | ICD-10-CM | POA: Diagnosis present

## 2013-10-26 DIAGNOSIS — I959 Hypotension, unspecified: Secondary | ICD-10-CM | POA: Diagnosis present

## 2013-10-26 LAB — URINE MICROSCOPIC-ADD ON

## 2013-10-26 LAB — CBC
HCT: 16.2 % — ABNORMAL LOW (ref 39.0–52.0)
HEMATOCRIT: 17.4 % — AB (ref 39.0–52.0)
Hemoglobin: 5.8 g/dL — CL (ref 13.0–17.0)
Hemoglobin: 5.6 g/dL — CL (ref 13.0–17.0)
MCH: 27.4 pg (ref 26.0–34.0)
MCH: 28.1 pg (ref 26.0–34.0)
MCHC: 33.3 g/dL (ref 30.0–36.0)
MCHC: 34.6 g/dL (ref 30.0–36.0)
MCV: 82.1 fL (ref 78.0–100.0)
MCV: 81.4 fL (ref 78.0–100.0)
PLATELETS: 175 10*3/uL (ref 150–400)
Platelets: 149 10*3/uL — ABNORMAL LOW (ref 150–400)
RBC: 2.12 MIL/uL — ABNORMAL LOW (ref 4.22–5.81)
RBC: 1.99 MIL/uL — AB (ref 4.22–5.81)
RDW: 15 % (ref 11.5–15.5)
RDW: 15.2 % (ref 11.5–15.5)
WBC: 7.9 10*3/uL (ref 4.0–10.5)
WBC: 10.2 10*3/uL (ref 4.0–10.5)

## 2013-10-26 LAB — URINALYSIS, ROUTINE W REFLEX MICROSCOPIC
Bilirubin Urine: NEGATIVE
Glucose, UA: NEGATIVE mg/dL
KETONES UR: NEGATIVE mg/dL
Nitrite: NEGATIVE
PROTEIN: 30 mg/dL — AB
Specific Gravity, Urine: 1.022 (ref 1.005–1.030)
Urobilinogen, UA: 0.2 mg/dL (ref 0.0–1.0)
pH: 5 (ref 5.0–8.0)

## 2013-10-26 LAB — BASIC METABOLIC PANEL WITH GFR
BUN: 71 mg/dL — ABNORMAL HIGH (ref 6–23)
CO2: 22 meq/L (ref 19–32)
Calcium: 8.2 mg/dL — ABNORMAL LOW (ref 8.4–10.5)
Chloride: 106 meq/L (ref 96–112)
Creatinine, Ser: 0.88 mg/dL (ref 0.50–1.35)
GFR calc Af Amer: 82 mL/min — ABNORMAL LOW
GFR calc non Af Amer: 71 mL/min — ABNORMAL LOW
Glucose, Bld: 181 mg/dL — ABNORMAL HIGH (ref 70–99)
Potassium: 5.2 meq/L (ref 3.7–5.3)
Sodium: 140 meq/L (ref 137–147)

## 2013-10-26 LAB — RAPID URINE DRUG SCREEN, HOSP PERFORMED
Amphetamines: NOT DETECTED
BARBITURATES: NOT DETECTED
Benzodiazepines: NOT DETECTED
Cocaine: NOT DETECTED
OPIATES: NOT DETECTED
TETRAHYDROCANNABINOL: NOT DETECTED

## 2013-10-26 LAB — CBC WITH DIFFERENTIAL/PLATELET
Basophils Absolute: 0 K/uL (ref 0.0–0.1)
Basophils Relative: 0 % (ref 0–1)
Eosinophils Absolute: 0 K/uL (ref 0.0–0.7)
Eosinophils Relative: 0 % (ref 0–5)
HCT: 18.8 % — ABNORMAL LOW (ref 39.0–52.0)
Hemoglobin: 6.2 g/dL — CL (ref 13.0–17.0)
Lymphocytes Relative: 5 % — ABNORMAL LOW (ref 12–46)
Lymphs Abs: 0.5 K/uL — ABNORMAL LOW (ref 0.7–4.0)
MCH: 27.1 pg (ref 26.0–34.0)
MCHC: 33 g/dL (ref 30.0–36.0)
MCV: 82.1 fL (ref 78.0–100.0)
Monocytes Absolute: 0.4 K/uL (ref 0.1–1.0)
Monocytes Relative: 4 % (ref 3–12)
Neutro Abs: 10 K/uL — ABNORMAL HIGH (ref 1.7–7.7)
Neutrophils Relative %: 91 % — ABNORMAL HIGH (ref 43–77)
Platelets: 228 K/uL (ref 150–400)
RBC: 2.29 MIL/uL — ABNORMAL LOW (ref 4.22–5.81)
RDW: 16.2 % — ABNORMAL HIGH (ref 11.5–15.5)
WBC: 10.9 K/uL — ABNORMAL HIGH (ref 4.0–10.5)

## 2013-10-26 LAB — TROPONIN I: Troponin I: <0.3 ng/mL (ref <0.30)

## 2013-10-26 LAB — PREPARE RBC (CROSSMATCH)

## 2013-10-26 LAB — PROTIME-INR
INR: 1.19 (ref 0.00–1.49)
Prothrombin Time: 14.8 s (ref 11.6–15.2)

## 2013-10-26 LAB — I-STAT CG4 LACTIC ACID, ED: LACTIC ACID, VENOUS: 2.05 mmol/L (ref 0.5–2.2)

## 2013-10-26 LAB — MRSA PCR SCREENING: MRSA by PCR: NEGATIVE

## 2013-10-26 LAB — GLUCOSE, CAPILLARY: Glucose-Capillary: 145 mg/dL — ABNORMAL HIGH (ref 70–99)

## 2013-10-26 MED ORDER — SODIUM CHLORIDE 0.9 % IJ SOLN
3.0000 mL | Freq: Two times a day (BID) | INTRAMUSCULAR | Status: DC
  Administered 2013-10-26 – 2013-10-30 (×7): 3 mL via INTRAVENOUS

## 2013-10-26 MED ORDER — SODIUM CHLORIDE 0.9 % IV BOLUS (SEPSIS)
1000.0000 mL | Freq: Once | INTRAVENOUS | Status: AC
  Administered 2013-10-26: 1000 mL via INTRAVENOUS

## 2013-10-26 MED ORDER — SODIUM CHLORIDE 0.9 % IV BOLUS (SEPSIS)
500.0000 mL | Freq: Once | INTRAVENOUS | Status: AC
  Administered 2013-10-26: 500 mL via INTRAVENOUS

## 2013-10-26 MED ORDER — PANTOPRAZOLE SODIUM 40 MG IV SOLR
40.0000 mg | Freq: Two times a day (BID) | INTRAVENOUS | Status: DC
  Administered 2013-10-26 – 2013-10-30 (×8): 40 mg via INTRAVENOUS
  Filled 2013-10-26 (×9): qty 40

## 2013-10-26 MED ORDER — SODIUM CHLORIDE 0.9 % IV SOLN
INTRAVENOUS | Status: DC
  Administered 2013-10-26 – 2013-10-27 (×3): via INTRAVENOUS

## 2013-10-26 MED ORDER — SODIUM CHLORIDE 0.9 % IV SOLN: INTRAVENOUS | Status: DC

## 2013-10-26 NOTE — ED Notes (Signed)
Attempted report x1. 

## 2013-10-26 NOTE — ED Notes (Signed)
Spoke with Clarene Critchley in blood bank, states that RBC unit sent to ED, b-negative blood, is an okay substitution for pt blood type, b-positive. Okay to proceed with blood transfusion.

## 2013-10-26 NOTE — ED Notes (Signed)
Family at bedside. Bobetta Lime, pt's son at bedside and informed of plan of care

## 2013-10-26 NOTE — ED Notes (Signed)
Patient transported to X-ray 

## 2013-10-26 NOTE — ED Notes (Signed)
Pt states he is unable to give urine at this time.

## 2013-10-26 NOTE — ED Notes (Signed)
I&o attempted, unable to retrieve urine

## 2013-10-26 NOTE — ED Notes (Signed)
Pt presents to ED with sudden onset of weakness and dizziness starting 0700 this am when his son attempted to get him out of the bed. Pt reports several days of weakness, dark stool x2 days, and intermittent blood in his urine x1 year. Per son pt had a tumor removed from his bladder last year and received radiation treatment s/p surgery. Son reports blood in pt's urine x2 weeks but not any blood in urine since surgery.

## 2013-10-26 NOTE — ED Notes (Signed)
Per [facility] pt from home, pt's son called 61 due to son attempted to assist pt from his bed approx 0700 this am, pt c/o weakness and dizziness, pt stated he felt like he needed to sit back down. Upon EMS arrival pt's BP 106/60, 90/44, 18 G RLFA started and 400 cc of NS administered increasing BP to 103/53.

## 2013-10-26 NOTE — ED Notes (Signed)
Pt son states that pt passed "black stool" last night around 1800-1830. Also noticed blood in urine.

## 2013-10-26 NOTE — ED Provider Notes (Signed)
CSN: [id number]     Arrival date & time 10/26/13  0813 History   First MD Initiated Contact with Patient 10/26/13 [phone]     Chief Complaint  Patient presents with  . Weakness     (Consider location/radiation/quality/duration/timing/severity/associated sxs/prior Treatment) Patient is a 80 y.o. male presenting with weakness. The history is provided by the patient and the EMS personnel.  Weakness   He, states he got weak when he sat on the edge of his bed this morning. His son called an ambulance. EMS found his blood pressure low at 90/44. He was treated with IV fluids with improvement of his blood pressure to 103/53. The symptoms apparently started. This morning. He reports his appetite has been good. He denies fever, chills, cough, shortness of breath, weakness, or dizziness, other than this morning. He has not seen his primary care doctor recently. He is taking his medicines as prescribed. There are no other known modifying factors.   Past Medical History  Diagnosis Date  . History of pulmonary embolus (PE)     APRIL 2011--  MULTIPLE PE'S AND DVT  . BPH (benign prostatic hypertrophy)   . History of urinary retention   . History of transurethral destruction of bladder lesion   . H/O asbestos exposure   . History of DVT (deep vein thrombosis)     APRIL 2011--  UPPER AND LOWER EXTREMITIES  . Bladder tumor     recurrent  . Nocturia   . Heart murmur, systolic     enlarged  . Bladder cancer 10/20/12    high grade papillary/invasive urothelial  . Hx of radiation therapy 01/02/13- 02/19/13    subtotal bladder 4500 cGy 25 sessions, boost 1600 cGy 8 sessions  . Skin cancer 03/2013    left cheek  . Asbestosis   . Fracture   . Hx of radiation therapy 01/02/13- 02/19/13    subtotal bladder 4500 cGy 25 sessions, reduced field boost 1600 cGy 8 sessions   Past Surgical History  Procedure Laterality Date  . Repair recurrent left inguinal hernia  06-01-1999    x 2  . Transurethral resection of  prostate  1999   &  11-17-2000  . Cysto/ eua/ placement suprapubic tube  01-13-2000  . Transurethral resection of bladder tumor  06-30-2010  . Transurethral resection of prostate  07-31-2010    AND BLADDER RESECTION BLADDER TUMOR  . Vena cava filter placement  APRIL 2011    INFERIOR  . Transthoracic echocardiogram  10-11-2012   DR TILLEY    CONCENTRIC LVH WITH NORMAL LVSF/ EF 66%/ GRADE I DIASTOLIC DYSFUNCTION  . Transurethral resection of bladder tumor N/A 10/20/2012    Procedure: TRANSURETHRAL RESECTION OF BLADDER TUMOR (TURBT);  Surgeon: Hanley Ben, MD;  Location: [facility];  Service: Urology;  Laterality: N/A;  . Cystoscopy N/A 10/20/2012    Procedure: CYSTOSCOPY;  Surgeon: Hanley Ben, MD;  Location: [facility];  Service: Urology;  Laterality: N/A;  . Cataract surgery      BILAT. EYES  . Mohs surgery  03/2013    left cheek  . Hip arthroplasty Left 05/01/2013    Procedure: ARTHROPLASTY BIPOLAR HIP;  Surgeon: Renette Butters, MD;  Location: [facility];  Service: Orthopedics;  Laterality: Left;   History reviewed. No pertinent family history. History  Substance Use Topics  . Smoking status: Never Smoker   . Smokeless tobacco: Never Used  . Alcohol Use: No    Review of Systems  Neurological: Positive for  weakness.  All other systems reviewed and are negative.     Allergies  Benadryl and Latex  Home Medications   Prior to Admission medications   Medication Sig Start Date End Date Taking? Authorizing Provider  acetaminophen (TYLENOL) 500 MG tablet Take 500 mg by mouth every 6 (six) hours as needed.    Historical Provider, MD  acidophilus (RISAQUAD) CAPS Take 1 capsule by mouth daily.    Historical Provider, MD  aspirin EC 325 MG tablet Take 1 tablet (325 mg total) by mouth daily. 05/01/13   Renette Butters, MD  CRANBERRY JUICE EXTRACT PO Take by mouth daily.    Historical Provider, MD  finasteride (PROSCAR) 5 MG tablet Take 5 mg by  mouth daily.    Historical Provider, MD  furosemide (LASIX) 40 MG tablet Take 40 mg by mouth daily.    Historical Provider, MD  magnesium hydroxide (MILK OF MAGNESIA) 400 MG/5ML suspension Take by mouth daily as needed for mild constipation.    Historical Provider, MD  Omega-3 Fatty Acids (FISH OIL) 1000 MG CAPS Take by mouth daily.    Historical Provider, MD  Saw Palmetto, Serenoa repens, (SAW PALMETTO PO) Take 1 tablet by mouth daily.     Historical Provider, MD   BP 92/44  Pulse 75  Temp(Src) 97.9 F (36.6 C) (Oral)  Resp 22  Ht 5\' 10"  (1.778 m)  Wt 164 lb 3.9 oz (74.5 kg)  BMI 23.57 kg/m2  SpO2 100% Physical Exam  Nursing note and vitals reviewed. Constitutional: He is oriented to person, place, and time. He appears well-developed.  Elderly frail  HENT:  Head: Normocephalic and atraumatic.  Right Ear: External ear normal.  Left Ear: External ear normal.  Eyes: Conjunctivae and EOM are normal. Pupils are equal, round, and reactive to light. Right eye exhibits no discharge. Left eye exhibits no discharge. No scleral icterus.  Conjunctiva are pale  Neck: Normal range of motion and phonation normal. Neck supple.  Cardiovascular: Normal rate, regular rhythm, normal heart sounds and intact distal pulses.   Pulmonary/Chest: Effort normal and breath sounds normal. No respiratory distress. He has no wheezes. He exhibits no bony tenderness.  Abdominal: Soft. There is no tenderness.  Musculoskeletal: Normal range of motion.  Symmetric weakness, legs, bilaterally- 4/5. No deformity, right elbow  Neurological: He is alert and oriented to person, place, and time. No cranial nerve deficit or sensory deficit. He exhibits normal muscle tone. Coordination normal.  Skin: Skin is warm, dry and intact.  Abrasion right elbow, not bleeding  Psychiatric: He has a normal mood and affect. His behavior is normal. Thought content normal.    ED Course  Procedures (including critical care  time)\ Medications  0.9 %  sodium chloride infusion ( Intravenous New Bag/Given 10/26/13 0943)  0.9 %  sodium chloride infusion ( Intravenous Rate/Dose Change 10/26/13 1635)  sodium chloride 0.9 % injection 3 mL (not administered)  pantoprazole (PROTONIX) injection 40 mg (not administered)  sodium chloride 0.9 % bolus 500 mL (not administered)  sodium chloride 0.9 % bolus 500 mL (0 mLs Intravenous Stopped 10/26/13 1042)    Patient Vitals for the past 24 hrs:  BP Temp Temp src Pulse Resp SpO2 Height Weight  10/26/13 1700 - 97.9 F (36.6 C) Oral - - - - -  10/26/13 1636 92/44 mmHg 98.2 F (36.8 C) Oral - - - - -  10/26/13 1515 101/42 mmHg - - 75 22 100 % - -  10/26/13 1514 - 98.5 F (  36.9 C) Oral - - - 5\' 10"  (1.778 m) 164 lb 3.9 oz (74.5 kg)  10/26/13 1430 91/54 mmHg - - 82 22 100 % - -  10/26/13 1415 97/52 mmHg - - 86 17 100 % - -  10/26/13 1400 134/75 mmHg - - 102 31 96 % - -  10/26/13 1352 109/70 mmHg 99.2 F (37.3 C) Oral 100 18 100 % - -  10/26/13 1345 107/52 mmHg - - 92 16 100 % - -  10/26/13 1330 94/53 mmHg - - 92 21 100 % - -  10/26/13 1315 93/55 mmHg - - 89 15 94 % - -  10/26/13 1310 98/50 mmHg - - 92 18 97 % - -  10/26/13 1307 105/55 mmHg - - 88 18 99 % - -  10/26/13 1305 105/55 mmHg - - 88 20 100 % - -  10/26/13 1300 103/49 mmHg - - 91 18 100 % - -  10/26/13 1255 108/52 mmHg - - 87 18 100 % - -  10/26/13 1250 107/52 mmHg - - 92 22 100 % - -  10/26/13 1245 104/53 mmHg - - 91 17 100 % - -  10/26/13 1240 106/48 mmHg - - 88 17 100 % - -  10/26/13 1235 96/53 mmHg - - 88 18 100 % - -  10/26/13 1230 93/46 mmHg - - 89 20 100 % - -  10/26/13 1225 94/50 mmHg 98.5 F (36.9 C) Oral 88 20 100 % - -  10/26/13 1215 95/47 mmHg - - 84 16 100 % - -  10/26/13 1211 104/56 mmHg 98.3 F (36.8 C) Oral 89 16 99 % - -  10/26/13 1200 103/46 mmHg - - 89 17 100 % - -  10/26/13 1130 92/47 mmHg - - 82 20 100 % - -  10/26/13 1112 102/50 mmHg 98.9 F (37.2 C) Oral 77 20 100 % - -  10/26/13 1100  102/50 mmHg - - 74 16 100 % - -  10/26/13 1027 106/44 mmHg - - 71 16 96 % - -  10/26/13 0923 89/42 mmHg - - 77 18 99 % - -  10/26/13 0847 107/46 mmHg 98.1 F (36.7 C) Rectal 91 24 100 % - -  10/26/13 0824 98/49 mmHg 98.2 F (36.8 C) Oral - 20 94 % - -   Blood Transfusion Ordered.  11:11 AM Reevaluation with update and discussion. After initial assessment and treatment, an updated evaluation reveals BP improved, his son reports the patient had a black stool, this morning, and sustained a fall yesterday with a elbow injury; this occurred while he was ambulating with his walker, he was able to ambulate afterwards. Findings discussed with pt and his son, all questions answered. Richarda Blade    11:24 AM-Consult complete with [facility] resident. Patient case explained and discussed. She agrees to admit patient for further evaluation and treatment. Call ended at 13:55  GI Consult- [facility] [place] will see patient- 14:10  CRITICAL CARE Performed by: Richarda Blade Total critical care time: 45 minutes Critical care time was exclusive of separately billable procedures and treating other patients. Critical care was necessary to treat or prevent imminent or life-threatening deterioration. Critical care was time spent personally by me on the following activities: development of treatment plan with patient and/or surrogate as well as nursing, discussions with consultants, evaluation of patient's response to treatment, examination of patient, obtaining history from patient or surrogate, ordering and performing treatments and interventions, ordering and review of laboratory  studies, ordering and review of radiographic studies, pulse oximetry and re-evaluation of patient's condition.   Labs Review Labs Reviewed  CBC WITH DIFFERENTIAL - Abnormal; Notable for the following:    WBC 10.9 (*)    RBC 2.29 (*)    Hemoglobin 6.2 (*)    HCT 18.8 (*)    RDW 16.2 (*)    Neutrophils Relative % 91 (*)    Lymphocytes  Relative 5 (*)    Neutro Abs 10.0 (*)    Lymphs Abs 0.5 (*)    All other components within normal limits  BASIC METABOLIC PANEL - Abnormal; Notable for the following:    Glucose, Bld 181 (*)    BUN 71 (*)    Calcium 8.2 (*)    GFR calc non Af Amer 71 (*)    GFR calc Af Amer 82 (*)    All other components within normal limits  URINALYSIS, ROUTINE W REFLEX MICROSCOPIC - Abnormal; Notable for the following:    APPearance CLOUDY (*)    Hgb urine dipstick LARGE (*)    Protein, ur 30 (*)    Leukocytes, UA LARGE (*)    All other components within normal limits  URINE MICROSCOPIC-ADD ON - Abnormal; Notable for the following:    Squamous Epithelial / LPF FEW (*)    Bacteria, UA MANY (*)    All other components within normal limits  GLUCOSE, CAPILLARY - Abnormal; Notable for the following:    Glucose-Capillary 145 (*)    All other components within normal limits  MRSA PCR SCREENING  URINE CULTURE  URINE RAPID DRUG SCREEN (HOSP PERFORMED)  TROPONIN I  TROPONIN I  TROPONIN I  CBC  CBC  PROTIME-INR  COMPREHENSIVE METABOLIC PANEL  I-STAT CG4 LACTIC ACID, ED  POC OCCULT BLOOD, ED  TYPE AND SCREEN  PREPARE RBC (CROSSMATCH)    Imaging Review Dg Chest 2 View  10/26/2013   CLINICAL DATA:  Weakness  EXAM: CHEST  2 VIEW  COMPARISON:  DG CHEST 1 VIEW dated 04/30/2013  FINDINGS: Calcified pleural plaques are reidentified. Healed left clavicular fracture. Heart size is mildly enlarged without evidence for edema. No new pulmonary parenchymal opacity. No pleural effusion.  IMPRESSION: No new acute cardiopulmonary process.  Evidence of asbestos exposure reidentified.   Electronically Signed   By: Conchita Paris M.D.   On: 10/26/2013 09:36     EKG Interpretation   Date/Time:  Friday October 26 2013 08:20:18 EDT Ventricular Rate:  75 PR Interval:  247 QRS Duration: 110 QT Interval:  436 QTC Calculation: 487 R Axis:   -74 Text Interpretation:  Sinus or ectopic atrial rhythm Atrial premature   complex Prolonged PR interval Consider right ventricular hypertrophy Left  ventricular hypertrophy Inferior infarct, age indeterminate Since last  tracing PR interval is longer Confirmed by [facility]  MD, Kiyan Burmester [phone]) on  10/26/2013 9:21:21 AM      MDM   Final diagnoses:  Anemia  GI bleeding    Weakness, and anemia, secondary to gastrointestinal bleeding. He is hemodynamically compromised with hypotension. He was admitted for further evaluation, and treatment.   Nursing Notes Reviewed/ Care Coordinated, and agree without changes. Applicable Imaging Reviewed.  Interpretation of Laboratory Data incorporated into ED treatment  Plan: Admit    Richarda Blade, MD 10/26/13 1759

## 2013-10-26 NOTE — H&P (Signed)
Date: 10/26/2013               Patient Name:  Omar [name] MRN: [id number]  DOB: 1916/06/14 Age / Sex: 80 y.o., male   PCP: Leonard Downing, MD         Medical Service: Internal Medicine Teaching Service         Attending Physician: Dr. Dominic Pea, DO    First Contact: Dr. Joni Reining Pager: [phone]  Second Contact: Dr. Jerene Pitch Pager: [phone]       After Hours (After 5p/  First Contact Pager: [phone]  weekends / holidays): Second Contact Pager: [phone]   Chief Complaint: syncope  History of Present Illness: HANNON CERA is a 80 yo male with PMH of Bladder CA, BPH, DVT.  He is very hard of hearing and much of the history is obtained from his son.  He reports that this morning his son got him up at about 6:30 in the morning.  As he stood Mr. Kaemmerer apparently collapsed back onto the the bed, the son reports that "his eyes rolled to the back of this head" he quickly regained conscienceness and the son decided to take him to the ED.  The son reports for the past few days he has had dark tarry stools and also some dark red blood in the urine.  Given his history of bladder cancer they initially tried to move up his follow up appointment with his Urlogist Dr. Janice Norrie until he collapsed.  On presentation to the ED he was found to be hypotensive with a Hgb of 6.2 g/dL.  He was given IVF bolus with response of BP and started with a transfusion of 1 unit of PRBC.  [facility] was called for admission. Of note patient does take Mobic daily, ASA daily, has had no hematemesis, no history of gerd, no recent weight loss, no change in stool caliber.  Patient has never had a colonoscopy.  Meds: Current Facility-Administered Medications  Medication Dose Route Frequency Provider Last Rate Last Dose  . 0.9 %  sodium chloride infusion   Intravenous Continuous Richarda Blade, MD 125 mL/hr at 10/26/13 [id number]     Current Outpatient Prescriptions  Medication Sig Dispense Refill  .  acetaminophen (TYLENOL) 500 MG tablet Take 500 mg by mouth every 6 (six) hours as needed.      Marland Kitchen aspirin EC 325 MG tablet Take 1 tablet (325 mg total) by mouth daily.  30 tablet  0  . CRANBERRY JUICE EXTRACT PO Take 2 capsules by mouth daily.       . finasteride (PROSCAR) 5 MG tablet Take 5 mg by mouth daily.      . furosemide (LASIX) 40 MG tablet Take 40 mg by mouth daily.      . magnesium hydroxide (MILK OF MAGNESIA) 400 MG/5ML suspension Take by mouth daily as needed for mild constipation.      . meloxicam (MOBIC) 15 MG tablet Take 15 mg by mouth daily.      . Omega-3 Fatty Acids (FISH OIL) 1000 MG CAPS Take by mouth daily.        Allergies: Allergies as of 10/26/2013 - Review Complete 10/26/2013  Allergen Reaction Noted  . Benadryl [diphenhydramine hcl] Other (See Comments) 10/16/2012  . Latex Other (See Comments) 10/16/2012   Past Medical History  Diagnosis Date  . History of pulmonary embolus (PE)     APRIL 2011--  MULTIPLE PE'S AND DVT  . BPH (benign prostatic hypertrophy)   .  History of urinary retention   . History of transurethral destruction of bladder lesion   . H/O asbestos exposure   . History of DVT (deep vein thrombosis)     APRIL 2011--  UPPER AND LOWER EXTREMITIES  . Bladder tumor     recurrent  . Nocturia   . Heart murmur, systolic     enlarged  . Bladder cancer 10/20/12    high grade papillary/invasive urothelial  . Hx of radiation therapy 01/02/13- 02/19/13    subtotal bladder 4500 cGy 25 sessions, boost 1600 cGy 8 sessions  . Skin cancer 03/2013    left cheek  . Asbestosis   . Fracture   . Hx of radiation therapy 01/02/13- 02/19/13    subtotal bladder 4500 cGy 25 sessions, reduced field boost 1600 cGy 8 sessions   Past Surgical History  Procedure Laterality Date  . Repair recurrent left inguinal hernia  06-01-1999    x 2  . Transurethral resection of prostate  1999   &  11-17-2000  . Cysto/ eua/ placement suprapubic tube  01-13-2000  . Transurethral  resection of bladder tumor  06-30-2010  . Transurethral resection of prostate  07-31-2010    AND BLADDER RESECTION BLADDER TUMOR  . Vena cava filter placement  APRIL 2011    INFERIOR  . Transthoracic echocardiogram  10-11-2012   DR TILLEY    CONCENTRIC LVH WITH NORMAL LVSF/ EF 53%/ GRADE I DIASTOLIC DYSFUNCTION  . Transurethral resection of bladder tumor N/A 10/20/2012    Procedure: TRANSURETHRAL RESECTION OF BLADDER TUMOR (TURBT);  Surgeon: Hanley Ben, MD;  Location: [facility];  Service: Urology;  Laterality: N/A;  . Cystoscopy N/A 10/20/2012    Procedure: CYSTOSCOPY;  Surgeon: Hanley Ben, MD;  Location: [facility];  Service: Urology;  Laterality: N/A;  . Cataract surgery      BILAT. EYES  . Mohs surgery  03/2013    left cheek  . Hip arthroplasty Left 05/01/2013    Procedure: ARTHROPLASTY BIPOLAR HIP;  Surgeon: Renette Butters, MD;  Location: [place];  Service: Orthopedics;  Laterality: Left;   History reviewed. No pertinent family history. History   Social History  . Marital Status: Married    Spouse Name: N/A    Number of Children: 4  . Years of Education: N/A   Occupational History  . RETIRED     [employer]   Social History Main Topics  . Smoking status: Never Smoker   . Smokeless tobacco: Never Used  . Alcohol Use: No  . Drug Use: No  . Sexual Activity: Not on file   Other Topics Concern  . Not on file   Social History Narrative   Married for 73 years.drives to see wife daily and feed lunch daily   WWll 2 years   No family history of cancer/prostate    Review of Systems: Review of Systems  Constitutional: Positive for malaise/fatigue. Negative for fever, chills and weight loss.  HENT: Negative for sore throat.   Eyes: Negative for blurred vision.  Respiratory: Negative for cough and shortness of breath.   Cardiovascular: Negative for chest pain and orthopnea.  Gastrointestinal: Positive for melena. Negative for  nausea, abdominal pain, diarrhea, constipation and blood in stool.  Genitourinary: Positive for hematuria. Negative for dysuria, urgency and flank pain.  Musculoskeletal: Negative for myalgias.  Skin: Negative for itching.  Neurological: Positive for weakness. Negative for dizziness, focal weakness and headaches.  All other systems reviewed and are negative.  Physical Exam: Blood pressure 109/70, pulse 100, temperature 99.2 F (37.3 C), temperature source Oral, resp. rate 18, SpO2 100.00%. Physical Exam  Nursing note and vitals reviewed. Constitutional: He is oriented to person, place, and time and well-developed, well-nourished, and in no distress. No distress.  HENT:  Head: Normocephalic and atraumatic.  HOH  Eyes:  Pale conjuctiva  Cardiovascular: Normal rate and regular rhythm.   Murmur (systolic) heard. Pulmonary/Chest: Effort normal and breath sounds normal. No respiratory distress. He has no wheezes. He has no rales.  Abdominal: Soft. Bowel sounds are normal. He exhibits no distension. There is no tenderness. There is no rebound and no guarding.  Musculoskeletal: He exhibits no edema.  Neurological: He is alert and oriented to person, place, and time.  Skin: Skin is warm and dry. He is not diaphoretic.  Psychiatric: Affect normal.     Lab results: Basic Metabolic Panel:  Recent Labs  10/26/13 0900  NA 140  K 5.2  CL 106  CO2 22  GLUCOSE 181*  BUN 71*  CREATININE 0.88  CALCIUM 8.2*   CBC:  Recent Labs  10/26/13 0900  WBC 10.9*  NEUTROABS 10.0*  HGB 6.2*  HCT 18.8*  MCV 82.1  PLT 228    Imaging results:  Dg Chest 2 View  10/26/2013   CLINICAL DATA:  Weakness  EXAM: CHEST  2 VIEW  COMPARISON:  DG CHEST 1 VIEW dated 04/30/2013  FINDINGS: Calcified pleural plaques are reidentified. Healed left clavicular fracture. Heart size is mildly enlarged without evidence for edema. No new pulmonary parenchymal opacity. No pleural effusion.  IMPRESSION: No new acute  cardiopulmonary process.  Evidence of asbestos exposure reidentified.   Electronically Signed   By: Conchita Paris M.D.   On: 10/26/2013 09:36    Other results: EKG: NSR, left axis deviation, no ST or T wave changes, poor R wave progression no previous available for comparision  Assessment & Plan by Problem:   Acute blood loss anemia Patient presented after syncopal episode.  Reported history of melena.  Hgb 6.7 on admission down from 10.8 in January. - Admit to inpatient (stepdown) - Transfuse 2 units of PRBC check post transfusion CBC - Maintain 2 large bore IV, IV bolus fluids as necessary to maintain BP. - Consult GI- Dr. Hung>>> has evaluated will do EGD in AM, NPO after midnight, may have Clear Liquid diet now. - IV protonix BID -CBC monitoring Q8 - Trend Cardiac enzymes -Telemetry    Symptomatic anemia -Transfusion threshold <8 given possible active GI bleed.  Symptoms have improved after 1 unit of PRBC.  - Continue to monitor H/H    Hypotension -Likely due to GI bleed.  Has been responsive to IVF Bolus - Continue to Bolus as necessary    Syncope Secondary to orthostatic hypotension due to GI bleed. 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  Will contact Urology. - Check U/A >>> Large Hgb large leukocytes (unchanged from previous) - Consult Urology Spoke with Dr. Tresa Moore recommened obtained CT chest abdomen and pelvis for prognosis and evaluate for metastatic CA.  Will come by for formal consult likely on Sunday after CT obtain.   Diet: Clear liquid DVT PPx: SCDs Code Status: DNR Dispo: Disposition is deferred at this time, awaiting improvement of current medical problems.   The patient does have a current PCP Redmond Pulling Arna Medici, MD) and does not need an [facility]  hospital follow-up appointment after discharge.  The patient does not have transportation limitations that hinder  transportation to clinic appointments.  Signed: Joni Reining, DO 10/26/2013, 2:05 PM

## 2013-10-26 NOTE — ED Notes (Signed)
Dr. Eulis Foster at bedside to assess pt

## 2013-10-26 NOTE — Consult Note (Signed)
Reason for Consult: Anemia and Melena Referring Physician: Teaching Service  Oral R Soderman HPI: This is a 80 year old male admitted for anemia and melena.  The patient's symptoms started acutely this AM.  Obtaining a history of difficult as he is very hard of hearing and his son is not available to provide any history.  No prior history of a GI bleed and he denies any abdominal pain.  His HGB in the recent past is in the 12-13 range.  No complaints of any chest pain.  Past Medical History  Diagnosis Date  . History of pulmonary embolus (PE)     APRIL 2011--  MULTIPLE PE'S AND DVT  . BPH (benign prostatic hypertrophy)   . History of urinary retention   . History of transurethral destruction of bladder lesion   . H/O asbestos exposure   . History of DVT (deep vein thrombosis)     APRIL 2011--  UPPER AND LOWER EXTREMITIES  . Bladder tumor     recurrent  . Nocturia   . Heart murmur, systolic     enlarged  . Bladder cancer 10/20/12    high grade papillary/invasive urothelial  . Hx of radiation therapy 01/02/13- 02/19/13    subtotal bladder 4500 cGy 25 sessions, boost 1600 cGy 8 sessions  . Skin cancer 03/2013    left cheek  . Asbestosis   . Fracture   . Hx of radiation therapy 01/02/13- 02/19/13    subtotal bladder 4500 cGy 25 sessions, reduced field boost 1600 cGy 8 sessions    Past Surgical History  Procedure Laterality Date  . Repair recurrent left inguinal hernia  06-01-1999    x 2  . Transurethral resection of prostate  1999   &  11-17-2000  . Cysto/ eua/ placement suprapubic tube  01-13-2000  . Transurethral resection of bladder tumor  06-30-2010  . Transurethral resection of prostate  07-31-2010    AND BLADDER RESECTION BLADDER TUMOR  . Vena cava filter placement  APRIL 2011    INFERIOR  . Transthoracic echocardiogram  10-11-2012   DR TILLEY    CONCENTRIC LVH WITH NORMAL LVSF/ EF 55%/ GRADE I DIASTOLIC DYSFUNCTION  . Transurethral resection of bladder tumor N/A 10/20/2012     Procedure: TRANSURETHRAL RESECTION OF BLADDER TUMOR (TURBT);  Surgeon: Marc-Henry Nesi, MD;  Location: [facility];  Service: Urology;  Laterality: N/A;  . Cystoscopy N/A 10/20/2012    Procedure: CYSTOSCOPY;  Surgeon: Marc-Henry Nesi, MD;  Location: [facility];  Service: Urology;  Laterality: N/A;  . Cataract surgery      BILAT. EYES  . Mohs surgery  03/2013    left cheek  . Hip arthroplasty Left 05/01/2013    Procedure: ARTHROPLASTY BIPOLAR HIP;  Surgeon: Timothy D Murphy, MD;  Location: [facility];  Service: Orthopedics;  Laterality: Left;    History reviewed. No pertinent family history.  Social History:  reports that he has never smoked. He has never used smokeless tobacco. He reports that he does not drink alcohol or use illicit drugs.  Allergies:  Allergies  Allergen Reactions  . Benadryl [Diphenhydramine Hcl] Other (See Comments)    Urinary retention  . Latex Other (See Comments)    blisters    Medications:  Scheduled:  Continuous: . sodium chloride 125 mL/hr at 10/26/13 0943    Results for orders placed during the hospital encounter of 10/26/13 (from the past 24 hour(s))  CBC WITH DIFFERENTIAL     Status: Abnormal     Collection Time    10/26/13  9:00 AM      Result Value Ref Range   WBC 10.9 (*) 4.0 - 10.5 K/uL   RBC 2.29 (*) 4.22 - 5.81 MIL/uL   Hemoglobin 6.2 (*) 13.0 - 17.0 g/dL   HCT 18.8 (*) 39.0 - 52.0 %   MCV 82.1  78.0 - 100.0 fL   MCH 27.1  26.0 - 34.0 pg   MCHC 33.0  30.0 - 36.0 g/dL   RDW 16.2 (*) 11.5 - 15.5 %   Platelets 228  150 - 400 K/uL   Neutrophils Relative % 91 (*) 43 - 77 %   Lymphocytes Relative 5 (*) 12 - 46 %   Monocytes Relative 4  3 - 12 %   Eosinophils Relative 0  0 - 5 %   Basophils Relative 0  0 - 1 %   Neutro Abs 10.0 (*) 1.7 - 7.7 K/uL   Lymphs Abs 0.5 (*) 0.7 - 4.0 K/uL   Monocytes Absolute 0.4  0.1 - 1.0 K/uL   Eosinophils Absolute 0.0  0.0 - 0.7 K/uL   Basophils Absolute 0.0  0.0 - 0.1 K/uL    RBC Morphology ELLIPTOCYTES    BASIC METABOLIC PANEL     Status: Abnormal   Collection Time    10/26/13  9:00 AM      Result Value Ref Range   Sodium 140  137 - 147 mEq/L   Potassium 5.2  3.7 - 5.3 mEq/L   Chloride 106  96 - 112 mEq/L   CO2 22  19 - 32 mEq/L   Glucose, Bld 181 (*) 70 - 99 mg/dL   BUN 71 (*) 6 - 23 mg/dL   Creatinine, Ser 0.88  0.50 - 1.35 mg/dL   Calcium 8.2 (*) 8.4 - 10.5 mg/dL   GFR calc non Af Amer 71 (*) >90 mL/min   GFR calc Af Amer 82 (*) >90 mL/min  I-STAT CG4 LACTIC ACID, ED     Status: None   Collection Time    10/26/13  9:18 AM      Result Value Ref Range   Lactic Acid, Venous 2.05  0.5 - 2.2 mmol/L  TYPE AND SCREEN     Status: None   Collection Time    10/26/13 10:30 AM      Result Value Ref Range   ABO/RH(D) B POS     Antibody Screen NEG     Sample Expiration 10/29/2013     Unit Number [id number]     Blood Component Type RED CELLS,LR     Unit division 00     Status of Unit ISSUED     Transfusion Status OK TO TRANSFUSE     Crossmatch Result Compatible     Unit Number [id number]     Blood Component Type RED CELLS,LR     Unit division 00     Status of Unit ALLOCATED     Transfusion Status OK TO TRANSFUSE     Crossmatch Result Compatible    PREPARE RBC (CROSSMATCH)     Status: None   Collection Time    10/26/13 11:15 AM      Result Value Ref Range   Order Confirmation ORDER PROCESSED BY BLOOD BANK    URINALYSIS, ROUTINE W REFLEX MICROSCOPIC     Status: Abnormal   Collection Time    10/26/13  2:00 PM      Result Value Ref Range   Color, Urine YELLOW  YELLOW     APPearance CLOUDY (*) CLEAR   Specific Gravity, Urine 1.022  1.005 - 1.030   pH 5.0  5.0 - 8.0   Glucose, UA NEGATIVE  NEGATIVE mg/dL   Hgb urine dipstick LARGE (*) NEGATIVE   Bilirubin Urine NEGATIVE  NEGATIVE   Ketones, ur NEGATIVE  NEGATIVE mg/dL   Protein, ur 30 (*) NEGATIVE mg/dL   Urobilinogen, UA 0.2  0.0 - 1.0 mg/dL   Nitrite NEGATIVE  NEGATIVE   Leukocytes, UA  LARGE (*) NEGATIVE  URINE MICROSCOPIC-ADD ON     Status: Abnormal   Collection Time    10/26/13  2:00 PM      Result Value Ref Range   Squamous Epithelial / LPF FEW (*) RARE   WBC, UA TOO NUMEROUS TO COUNT  <3 WBC/hpf   RBC / HPF TOO NUMEROUS TO COUNT  <3 RBC/hpf   Bacteria, UA MANY (*) RARE     Dg Chest 2 View  10/26/2013   CLINICAL DATA:  Weakness  EXAM: CHEST  2 VIEW  COMPARISON:  DG CHEST 1 VIEW dated 04/30/2013  FINDINGS: Calcified pleural plaques are reidentified. Healed left clavicular fracture. Heart size is mildly enlarged without evidence for edema. No new pulmonary parenchymal opacity. No pleural effusion.  IMPRESSION: No new acute cardiopulmonary process.  Evidence of asbestos exposure reidentified.   Electronically Signed   By: Conchita Paris M.D.   On: 10/26/2013 09:36    ROS:  As stated above in the HPI otherwise negative.  Blood pressure 91/54, pulse 82, temperature 99.2 F (37.3 C), temperature source Oral, resp. rate 22, SpO2 100.00%.    PE: Gen: NAD, Alert and Oriented, hard of hearing HEENT:  [place]/AT, EOMI Neck: Supple, no LAD Lungs: CTA Bilaterally CV: RRR without M/G/R ABM: Soft, NTND, +BS Ext: No C/C/E  Assessment/Plan: 1) Melena. 2) Anemia.   With his current presentation an EGD will be performed.  He was hypotensive when he first presented, but this has resolved.  Plan: 1) EGD tomorrow. 2) Monitor HGB and transfuse as necessary.  Beryle Beams 10/26/2013, 3:08 PM

## 2013-10-26 NOTE — ED Notes (Signed)
Results of lactic acid called to primary nurse Magda Paganini on POD A

## 2013-10-27 ENCOUNTER — Inpatient Hospital Stay (HOSPITAL_COMMUNITY): Payer: Medicare Other

## 2013-10-27 ENCOUNTER — Encounter (HOSPITAL_COMMUNITY)
Admission: EM | Disposition: A | Discharge status: Skilled Nursing Facility | Payer: Self-pay | Source: Home / Self Care | Attending: Internal Medicine

## 2013-10-27 ENCOUNTER — Encounter (HOSPITAL_COMMUNITY): Payer: Self-pay

## 2013-10-27 DIAGNOSIS — K922 Gastrointestinal hemorrhage, unspecified: Secondary | ICD-10-CM

## 2013-10-27 DIAGNOSIS — D649 Anemia, unspecified: Secondary | ICD-10-CM

## 2013-10-27 DIAGNOSIS — R319 Hematuria, unspecified: Secondary | ICD-10-CM

## 2013-10-27 DIAGNOSIS — S42009A Fracture of unspecified part of unspecified clavicle, initial encounter for closed fracture: Secondary | ICD-10-CM

## 2013-10-27 DIAGNOSIS — I959 Hypotension, unspecified: Secondary | ICD-10-CM

## 2013-10-27 DIAGNOSIS — D62 Acute posthemorrhagic anemia: Secondary | ICD-10-CM

## 2013-10-27 DIAGNOSIS — R55 Syncope and collapse: Secondary | ICD-10-CM

## 2013-10-27 DIAGNOSIS — R578 Other shock: Secondary | ICD-10-CM | POA: Diagnosis not present

## 2013-10-27 DIAGNOSIS — C671 Malignant neoplasm of dome of bladder: Secondary | ICD-10-CM

## 2013-10-27 HISTORY — PX: ESOPHAGOGASTRODUODENOSCOPY: SHX5428

## 2013-10-27 LAB — GLUCOSE, CAPILLARY
GLUCOSE-CAPILLARY: 167 mg/dL — AB (ref 70–99)
GLUCOSE-CAPILLARY: 172 mg/dL — AB (ref 70–99)

## 2013-10-27 LAB — CBC
HCT: 18.6 % — ABNORMAL LOW (ref 39.0–52.0)
HCT: 22.3 % — ABNORMAL LOW (ref 39.0–52.0)
HEMATOCRIT: 21.5 % — AB (ref 39.0–52.0)
HEMOGLOBIN: 6.5 g/dL — AB (ref 13.0–17.0)
HEMOGLOBIN: 7.9 g/dL — AB (ref 13.0–17.0)
Hemoglobin: 7.5 g/dL — ABNORMAL LOW (ref 13.0–17.0)
MCH: 29.3 pg (ref 26.0–34.0)
MCH: 29.3 pg (ref 26.0–34.0)
MCH: 28.6 pg (ref 26.0–34.0)
MCHC: 34.9 g/dL (ref 30.0–36.0)
MCHC: 35.4 g/dL (ref 30.0–36.0)
MCHC: 34.9 g/dL (ref 30.0–36.0)
MCV: 83.8 fL (ref 78.0–100.0)
MCV: 82.6 fL (ref 78.0–100.0)
MCV: 82.1 fL (ref 78.0–100.0)
PLATELETS: 131 10*3/uL — AB (ref 150–400)
Platelets: 127 10*3/uL — ABNORMAL LOW (ref 150–400)
Platelets: 127 10*3/uL — ABNORMAL LOW (ref 150–400)
RBC: 2.22 MIL/uL — ABNORMAL LOW (ref 4.22–5.81)
RBC: 2.7 MIL/uL — AB (ref 4.22–5.81)
RBC: 2.62 MIL/uL — AB (ref 4.22–5.81)
RDW: 15 % (ref 11.5–15.5)
RDW: 14 % (ref 11.5–15.5)
RDW: 14.2 % (ref 11.5–15.5)
WBC: 9.7 10*3/uL (ref 4.0–10.5)
WBC: 12.8 10*3/uL — ABNORMAL HIGH (ref 4.0–10.5)
WBC: 14.9 10*3/uL — AB (ref 4.0–10.5)

## 2013-10-27 LAB — TROPONIN I: Troponin I: <0.3 ng/mL (ref <0.30)

## 2013-10-27 LAB — COMPREHENSIVE METABOLIC PANEL
AST: 10 U/L (ref 0–37)
Albumin: 1.6 g/dL — ABNORMAL LOW (ref 3.5–5.2)
Alkaline Phosphatase: 43 U/L (ref 39–117)
BILIRUBIN TOTAL: 0.6 mg/dL (ref 0.3–1.2)
BUN: 70 mg/dL — ABNORMAL HIGH (ref 6–23)
CHLORIDE: 112 meq/L (ref 96–112)
CO2: 20 meq/L (ref 19–32)
Calcium: 7.2 mg/dL — ABNORMAL LOW (ref 8.4–10.5)
Creatinine, Ser: 0.84 mg/dL (ref 0.50–1.35)
GFR, EST AFRICAN AMERICAN: 84 mL/min — AB (ref >90)
GFR, EST NON AFRICAN AMERICAN: 72 mL/min — AB (ref >90)
GLUCOSE: 169 mg/dL — AB (ref 70–99)
Potassium: 4.5 mEq/L (ref 3.7–5.3)
SODIUM: 142 meq/L (ref 137–147)
Total Protein: 3.6 g/dL — ABNORMAL LOW (ref 6.0–8.3)

## 2013-10-27 LAB — BASIC METABOLIC PANEL
BUN: 65 mg/dL — ABNORMAL HIGH (ref 6–23)
CO2: 20 meq/L (ref 19–32)
Calcium: 7.7 mg/dL — ABNORMAL LOW (ref 8.4–10.5)
Chloride: 115 mEq/L — ABNORMAL HIGH (ref 96–112)
Creatinine, Ser: 0.83 mg/dL (ref 0.50–1.35)
GFR calc Af Amer: 84 mL/min — ABNORMAL LOW (ref >90)
GFR, EST NON AFRICAN AMERICAN: 72 mL/min — AB (ref >90)
GLUCOSE: 161 mg/dL — AB (ref 70–99)
POTASSIUM: 4.1 meq/L (ref 3.7–5.3)
SODIUM: 144 meq/L (ref 137–147)

## 2013-10-27 LAB — PREPARE RBC (CROSSMATCH)

## 2013-10-27 SURGERY — EGD (ESOPHAGOGASTRODUODENOSCOPY)
Anesthesia: Moderate Sedation

## 2013-10-27 MED ORDER — MIDAZOLAM HCL 5 MG/ML IJ SOLN
INTRAMUSCULAR | Status: AC
  Filled 2013-10-27: qty 2

## 2013-10-27 MED ORDER — BUTAMBEN-TETRACAINE-BENZOCAINE 2-2-14 % EX AERO
INHALATION_SPRAY | CUTANEOUS | Status: DC | PRN
  Administered 2013-10-27: 2 via TOPICAL

## 2013-10-27 MED ORDER — IOHEXOL 300 MG/ML  SOLN
150.0000 mL | Freq: Once | INTRAMUSCULAR | Status: AC | PRN
  Administered 2013-10-27: 1 mL via INTRAVENOUS

## 2013-10-27 MED ORDER — SODIUM CHLORIDE 0.9 % IV BOLUS (SEPSIS)
500.0000 mL | Freq: Once | INTRAVENOUS | Status: AC
  Administered 2013-10-27: 500 mL via INTRAVENOUS

## 2013-10-27 MED ORDER — CEFAZOLIN SODIUM-DEXTROSE 2-3 GM-% IV SOLR
2.0000 g | Freq: Once | INTRAVENOUS | Status: AC
  Administered 2013-10-27: 2 g via INTRAVENOUS

## 2013-10-27 MED ORDER — SODIUM CHLORIDE 0.9 % IV BOLUS (SEPSIS): 500.0000 mL | Freq: Once | INTRAVENOUS | Status: DC

## 2013-10-27 MED ORDER — MIDAZOLAM HCL 2 MG/2ML IJ SOLN
INTRAMUSCULAR | Status: AC
  Filled 2013-10-27: qty 4

## 2013-10-27 MED ORDER — CEFAZOLIN SODIUM-DEXTROSE 2-3 GM-% IV SOLR
INTRAVENOUS | Status: AC
  Administered 2013-10-27: 2 g via INTRAVENOUS
  Filled 2013-10-27: qty 50

## 2013-10-27 MED ORDER — BIOTENE DRY MOUTH MT LIQD
15.0000 mL | Freq: Two times a day (BID) | OROMUCOSAL | Status: DC
  Administered 2013-10-27 – 2013-10-29 (×5): 15 mL via OROMUCOSAL

## 2013-10-27 MED ORDER — MIDAZOLAM HCL 10 MG/2ML IJ SOLN
INTRAMUSCULAR | Status: DC | PRN
  Administered 2013-10-27 (×2): 1 mg via INTRAVENOUS

## 2013-10-27 MED ORDER — FENTANYL CITRATE 0.05 MG/ML IJ SOLN
INTRAMUSCULAR | Status: AC | PRN
  Administered 2013-10-27 (×2): 25 ug via INTRAVENOUS
  Administered 2013-10-27: 50 ug via INTRAVENOUS

## 2013-10-27 MED ORDER — MIDAZOLAM HCL 2 MG/2ML IJ SOLN
INTRAMUSCULAR | Status: AC | PRN
  Administered 2013-10-27 (×4): 1 mg via INTRAVENOUS

## 2013-10-27 MED ORDER — FENTANYL CITRATE 0.05 MG/ML IJ SOLN
INTRAMUSCULAR | Status: AC
  Filled 2013-10-27: qty 2

## 2013-10-27 MED ORDER — SODIUM CHLORIDE 0.9 % IJ SOLN
INTRAMUSCULAR | Status: DC | PRN
  Administered 2013-10-27: 13:00:00

## 2013-10-27 MED ORDER — MIDAZOLAM HCL 2 MG/2ML IJ SOLN
INTRAMUSCULAR | Status: AC
  Filled 2013-10-27: qty 2

## 2013-10-27 MED ORDER — SODIUM CHLORIDE 0.9 % IV BOLUS (SEPSIS): 1000.0000 mL | Freq: Once | INTRAVENOUS | Status: DC

## 2013-10-27 NOTE — Interval H&P Note (Signed)
History and Physical Interval Note:  10/27/2013 12:58 PM  Omar [name]  has presented today for surgery, with the diagnosis of Anemia and melena  The various methods of treatment have been discussed with the patient and family. After consideration of risks, benefits and other options for treatment, the patient has consented to  Procedure(s): ESOPHAGOGASTRODUODENOSCOPY (EGD) (N/A) as a surgical intervention .  The patient's history has been reviewed, patient examined, no change in status, stable for surgery.  I have reviewed the patient's chart and labs.  Questions were answered to the patient's satisfaction.     Beryle Beams

## 2013-10-27 NOTE — Progress Notes (Signed)
Post-transfusion CBC (after 2 units pRBCs) showed Hgb 5.8.  Stat repeat CBC at 11:15p showed Hgb 5.6.  No active bleeding noted by RN.   Evaluated patient at bedside.  VSS with systolic BP 169I around 50:38UE.  Patient alert and cooperative.  Abdominal exam benign- soft, nontender, nondistended, no rebound or guarding.    Ordered stat noncontrast CT abdomen/pelvis as well as 2 additional units of pRBCs.   Spoke to RN around 1am who states that patient had large bloody (mixed bright red and dark) bowel movement around 12:30am. BP 280K --> 34J systolic after BM.  Patient coming back from CT, will bolus NS 500 cc prior to transfusion with 2 units pRBCs (blood arrived on floor).

## 2013-10-27 NOTE — Op Note (Addendum)
[facility] [place], [address]   OPERATIVE PROCEDURE REPORT  PATIENT: Omar [name], Omar [name]  MR#: [id number] BIRTHDATE: 04-13-1916  GENDER: Male ENDOSCOPIST: Carol Ada, MD ASSISTANT:   Sharon Mt, Endo Technician and Verlon Au, RN, BSN PROCEDURE DATE: 10/27/2013 PROCEDURE:   EGD with control of bleeding ASA CLASS:   Class III INDICATIONS: Melena and anemia MEDICATIONS: Versed 2 mg IV TOPICAL ANESTHETIC:   none  DESCRIPTION OF PROCEDURE:   After the risks benefits and alternatives of the procedure were thoroughly explained, informed consent was obtained.  The Pentax Gastroscope [id number]  endoscope was introduced through the mouth  and advanced to the second portion of the duodenum without limitations.      The instrument was slowly withdrawn as the mucosa was fully examined.      FINDINGS: Upon initial entry into the esophagus there was a large amount of fluid.  This was suctioned and the esophagus was cleared. The esophagus was dilated, but no overt evidence of a stricture. In the gastric antrum fresh blood was identified.  The duodenal bulb was entered and it was distored.  A diverticulum was found and a large ulcer with an overlying clot was identified.  Three ml of 1:10,000 Epi was injected around the duodenal ulcer.  Positioning was difficult as a result of the ulcer location and the distorted duoednal lumen.  The bleeding was controlled with the epinephrine. Gentle manipulation was performed to expose the ulcer bed, however, this precipitated arterial spurting.  Two out of three hemoclips were successfully deployed and this markedly slowed the bleeding. Slow to moderately paced arterial oozing was noted.   Retroflexion was not performed.     The scope was then withdrawn from the patient and the procedure terminated.  COMPLICATIONS: There were no complications. IMPRESSION: 1) Duodenal bulb ulcer with arterial spurting  not amenable to endoscopic control.  RECOMMENDATIONS: 1) IR Embolization.  I spoke with Dr. Laurence Ferrari and he has evaluated the patient.  _______________________________ eSigned:  Carol Ada, MD 10/27/2013 1:52 PM  Revised: 10/27/2013 1:52 PM

## 2013-10-27 NOTE — Procedures (Signed)
Interventional Radiology Procedure Note  Procedure: Mesenteric angiogram and coil embolization of GDA.   Access: Right common femoral artery, 65F.  Mynx closure device used.  Complications: None Recommendations: - Pt received 2 units PRBC total (1 in GI, 1 in IR) - Repeat stat H&H upon arrival to floor -Continue to watch H&H - Leg straight x 2 hrs  Signed,  Criselda Peaches, MD [facility]

## 2013-10-27 NOTE — Sedation Documentation (Signed)
Blood infusion complete- will recheck hgb

## 2013-10-27 NOTE — Progress Notes (Signed)
Spoke to Dr. Benson Norway.  He is in agreement with plan to give patient additional 2 units pRBCs with plan for EGD in AM assuming BP stable.

## 2013-10-27 NOTE — H&P (View-Only) (Signed)
Reason for Consult: Anemia and Melena Referring Physician: Teaching Service  Florene Glen HPI: This is a 80 year old male admitted for anemia and melena.  The patient's symptoms started acutely this AM.  Obtaining a history of difficult as he is very hard of hearing and his son is not available to provide any history.  No prior history of a GI bleed and he denies any abdominal pain.  His HGB in the recent past is in the 12-13 range.  No complaints of any chest pain.  Past Medical History  Diagnosis Date  . History of pulmonary embolus (PE)     APRIL 2011--  MULTIPLE PE'S AND DVT  . BPH (benign prostatic hypertrophy)   . History of urinary retention   . History of transurethral destruction of bladder lesion   . H/O asbestos exposure   . History of DVT (deep vein thrombosis)     APRIL 2011--  UPPER AND LOWER EXTREMITIES  . Bladder tumor     recurrent  . Nocturia   . Heart murmur, systolic     enlarged  . Bladder cancer 10/20/12    high grade papillary/invasive urothelial  . Hx of radiation therapy 01/02/13- 02/19/13    subtotal bladder 4500 cGy 25 sessions, boost 1600 cGy 8 sessions  . Skin cancer 03/2013    left cheek  . Asbestosis   . Fracture   . Hx of radiation therapy 01/02/13- 02/19/13    subtotal bladder 4500 cGy 25 sessions, reduced field boost 1600 cGy 8 sessions    Past Surgical History  Procedure Laterality Date  . Repair recurrent left inguinal hernia  06-01-1999    x 2  . Transurethral resection of prostate  1999   &  11-17-2000  . Cysto/ eua/ placement suprapubic tube  01-13-2000  . Transurethral resection of bladder tumor  06-30-2010  . Transurethral resection of prostate  07-31-2010    AND BLADDER RESECTION BLADDER TUMOR  . Vena cava filter placement  APRIL 2011    INFERIOR  . Transthoracic echocardiogram  10-11-2012   DR TILLEY    CONCENTRIC LVH WITH NORMAL LVSF/ EF 56%/ GRADE I DIASTOLIC DYSFUNCTION  . Transurethral resection of bladder tumor N/A 10/20/2012     Procedure: TRANSURETHRAL RESECTION OF BLADDER TUMOR (TURBT);  Surgeon: Hanley Ben, MD;  Location: [facility];  Service: Urology;  Laterality: N/A;  . Cystoscopy N/A 10/20/2012    Procedure: CYSTOSCOPY;  Surgeon: Hanley Ben, MD;  Location: [facility];  Service: Urology;  Laterality: N/A;  . Cataract surgery      BILAT. EYES  . Mohs surgery  03/2013    left cheek  . Hip arthroplasty Left 05/01/2013    Procedure: ARTHROPLASTY BIPOLAR HIP;  Surgeon: Renette Butters, MD;  Location: [facility];  Service: Orthopedics;  Laterality: Left;    History reviewed. No pertinent family history.  Social History:  reports that he has never smoked. He has never used smokeless tobacco. He reports that he does not drink alcohol or use illicit drugs.  Allergies:  Allergies  Allergen Reactions  . Benadryl [Diphenhydramine Hcl] Other (See Comments)    Urinary retention  . Latex Other (See Comments)    blisters    Medications:  Scheduled:  Continuous: . sodium chloride 125 mL/hr at 10/26/13 3875    Results for orders placed during the hospital encounter of 10/26/13 (from the past 24 hour(s))  CBC WITH DIFFERENTIAL     Status: Abnormal  Collection Time    10/26/13  9:00 AM      Result Value Ref Range   WBC 10.9 (*) 4.0 - 10.5 K/uL   RBC 2.29 (*) 4.22 - 5.81 MIL/uL   Hemoglobin 6.2 (*) 13.0 - 17.0 g/dL   HCT 18.8 (*) 39.0 - 52.0 %   MCV 82.1  78.0 - 100.0 fL   MCH 27.1  26.0 - 34.0 pg   MCHC 33.0  30.0 - 36.0 g/dL   RDW 16.2 (*) 11.5 - 15.5 %   Platelets 228  150 - 400 K/uL   Neutrophils Relative % 91 (*) 43 - 77 %   Lymphocytes Relative 5 (*) 12 - 46 %   Monocytes Relative 4  3 - 12 %   Eosinophils Relative 0  0 - 5 %   Basophils Relative 0  0 - 1 %   Neutro Abs 10.0 (*) 1.7 - 7.7 K/uL   Lymphs Abs 0.5 (*) 0.7 - 4.0 K/uL   Monocytes Absolute 0.4  0.1 - 1.0 K/uL   Eosinophils Absolute 0.0  0.0 - 0.7 K/uL   Basophils Absolute 0.0  0.0 - 0.1 K/uL    RBC Morphology ELLIPTOCYTES    BASIC METABOLIC PANEL     Status: Abnormal   Collection Time    10/26/13  9:00 AM      Result Value Ref Range   Sodium 140  137 - 147 mEq/L   Potassium 5.2  3.7 - 5.3 mEq/L   Chloride 106  96 - 112 mEq/L   CO2 22  19 - 32 mEq/L   Glucose, Bld 181 (*) 70 - 99 mg/dL   BUN 71 (*) 6 - 23 mg/dL   Creatinine, Ser 0.88  0.50 - 1.35 mg/dL   Calcium 8.2 (*) 8.4 - 10.5 mg/dL   GFR calc non Af Amer 71 (*) >90 mL/min   GFR calc Af Amer 82 (*) >90 mL/min  I-STAT CG4 LACTIC ACID, ED     Status: None   Collection Time    10/26/13  9:18 AM      Result Value Ref Range   Lactic Acid, Venous 2.05  0.5 - 2.2 mmol/L  TYPE AND SCREEN     Status: None   Collection Time    10/26/13 10:30 AM      Result Value Ref Range   ABO/RH(D) B POS     Antibody Screen NEG     Sample Expiration 10/29/2013     Unit Number [id number]     Blood Component Type RED CELLS,LR     Unit division 00     Status of Unit ISSUED     Transfusion Status OK TO TRANSFUSE     Crossmatch Result Compatible     Unit Number [id number]     Blood Component Type RED CELLS,LR     Unit division 00     Status of Unit ALLOCATED     Transfusion Status OK TO TRANSFUSE     Crossmatch Result Compatible    PREPARE RBC (CROSSMATCH)     Status: None   Collection Time    10/26/13 11:15 AM      Result Value Ref Range   Order Confirmation ORDER PROCESSED BY BLOOD BANK    URINALYSIS, ROUTINE W REFLEX MICROSCOPIC     Status: Abnormal   Collection Time    10/26/13  2:00 PM      Result Value Ref Range   Color, Urine YELLOW  YELLOW  APPearance CLOUDY (*) CLEAR   Specific Gravity, Urine 1.022  1.005 - 1.030   pH 5.0  5.0 - 8.0   Glucose, UA NEGATIVE  NEGATIVE mg/dL   Hgb urine dipstick LARGE (*) NEGATIVE   Bilirubin Urine NEGATIVE  NEGATIVE   Ketones, ur NEGATIVE  NEGATIVE mg/dL   Protein, ur 30 (*) NEGATIVE mg/dL   Urobilinogen, UA 0.2  0.0 - 1.0 mg/dL   Nitrite NEGATIVE  NEGATIVE   Leukocytes, UA  LARGE (*) NEGATIVE  URINE MICROSCOPIC-ADD ON     Status: Abnormal   Collection Time    10/26/13  2:00 PM      Result Value Ref Range   Squamous Epithelial / LPF FEW (*) RARE   WBC, UA TOO NUMEROUS TO COUNT  <3 WBC/hpf   RBC / HPF TOO NUMEROUS TO COUNT  <3 RBC/hpf   Bacteria, UA MANY (*) RARE     Dg Chest 2 View  10/26/2013   CLINICAL DATA:  Weakness  EXAM: CHEST  2 VIEW  COMPARISON:  DG CHEST 1 VIEW dated 04/30/2013  FINDINGS: Calcified pleural plaques are reidentified. Healed left clavicular fracture. Heart size is mildly enlarged without evidence for edema. No new pulmonary parenchymal opacity. No pleural effusion.  IMPRESSION: No new acute cardiopulmonary process.  Evidence of asbestos exposure reidentified.   Electronically Signed   By: Conchita Paris M.D.   On: 10/26/2013 09:36    ROS:  As stated above in the HPI otherwise negative.  Blood pressure 91/54, pulse 82, temperature 99.2 F (37.3 C), temperature source Oral, resp. rate 22, SpO2 100.00%.    PE: Gen: NAD, Alert and Oriented, hard of hearing HEENT:  [place]/AT, EOMI Neck: Supple, no LAD Lungs: CTA Bilaterally CV: RRR without M/G/R ABM: Soft, NTND, +BS Ext: No C/C/E  Assessment/Plan: 1) Melena. 2) Anemia.   With his current presentation an EGD will be performed.  He was hypotensive when he first presented, but this has resolved.  Plan: 1) EGD tomorrow. 2) Monitor HGB and transfuse as necessary.  Beryle Beams 10/26/2013, 3:08 PM

## 2013-10-27 NOTE — Consult Note (Signed)
PULMONARY / CRITICAL CARE MEDICINE   Name: Omar [name] MRN: [id number] DOB: July 06, 1916    ADMISSION DATE:  10/26/2013 CONSULTATION DATE:  10/27/13  REFERRING MD :  GI PRIMARY SERVICE: GI   CHIEF COMPLAINT:  GIB   BRIEF PATIENT DESCRIPTION: 80 yo WM presented with melena and anemia underwent EGD with duodenal ulcer w/ active arterial bleed failed clipping, sent to IR for angio and embolization . PCCM consulted for ICU care .   SIGNIFICANT EVENTS / STUDIES:  EGD 4/18 duodenal ulcer w/ active bleed , clip placed IR messenteric angio /embolization  Transfused 7 U PRBC 4/18   LINES / TUBES:   CULTURES: UC 4/17 >>  ANTIBIOTICS:    HISTORY OF PRESENT ILLNESS:   Omar [name] is an 80 y.o. male with extensive PMH currently admitted for acute anemia and melena. H/H 6.2/18.8 on presentation compared to 10.8/33 on 07/26/13. On EGD today he was found to have a proximal duodenal / duodenal bulb ulcer with fresh clot and active arterial bleeding. A clip was placed which slowed but did not stop the hemorrhage. Pt required multiple transfusions. Sent to IR underwent messenteric angio and embolization. In ICU , b/p imrpoved, good mentation.  Was on mobic daily PTA    PAST MEDICAL HISTORY :  Past Medical History  Diagnosis Date  . History of pulmonary embolus (PE)     APRIL 2011--  MULTIPLE PE'S AND DVT  . BPH (benign prostatic hypertrophy)   . History of urinary retention   . History of transurethral destruction of bladder lesion   . H/O asbestos exposure   . History of DVT (deep vein thrombosis)     APRIL 2011--  UPPER AND LOWER EXTREMITIES  . Bladder tumor     recurrent  . Nocturia   . Heart murmur, systolic     enlarged  . Bladder cancer 10/20/12    high grade papillary/invasive urothelial  . Hx of radiation therapy 01/02/13- 02/19/13    subtotal bladder 4500 cGy 25 sessions, boost 1600 cGy 8 sessions  . Skin cancer 03/2013    left cheek  . Asbestosis   . Fracture   .  Hx of radiation therapy 01/02/13- 02/19/13    subtotal bladder 4500 cGy 25 sessions, reduced field boost 1600 cGy 8 sessions   Past Surgical History  Procedure Laterality Date  . Repair recurrent left inguinal hernia  06-01-1999    x 2  . Transurethral resection of prostate  1999   &  11-17-2000  . Cysto/ eua/ placement suprapubic tube  01-13-2000  . Transurethral resection of bladder tumor  06-30-2010  . Transurethral resection of prostate  07-31-2010    AND BLADDER RESECTION BLADDER TUMOR  . Vena cava filter placement  APRIL 2011    INFERIOR  . Transthoracic echocardiogram  10-11-2012   DR TILLEY    CONCENTRIC LVH WITH NORMAL LVSF/ EF [id number] GRADE I DIASTOLIC DYSFUNCTION  . Transurethral resection of bladder tumor N/A 10/20/2012    Procedure: TRANSURETHRAL RESECTION OF BLADDER TUMOR (TURBT);  Surgeon: Hanley Ben, MD;  Location: [facility];  Service: Urology;  Laterality: N/A;  . Cystoscopy N/A 10/20/2012    Procedure: CYSTOSCOPY;  Surgeon: Hanley Ben, MD;  Location: [facility];  Service: Urology;  Laterality: N/A;  . Cataract surgery      BILAT. EYES  . Mohs surgery  03/2013    left cheek  . Hip arthroplasty Left 05/01/2013    Procedure: ARTHROPLASTY BIPOLAR HIP;  Surgeon: Renette Butters, MD;  Location: [facility];  Service: Orthopedics;  Laterality: Left;   Prior to Admission medications   Medication Sig Start Date End Date Taking? Authorizing Provider  acetaminophen (TYLENOL) 500 MG tablet Take 500 mg by mouth every 6 (six) hours as needed.   Yes Historical Provider, MD  aspirin EC 325 MG tablet Take 1 tablet (325 mg total) by mouth daily. 05/01/13  Yes Renette Butters, MD  CRANBERRY JUICE EXTRACT PO Take 2 capsules by mouth daily.    Yes Historical Provider, MD  finasteride (PROSCAR) 5 MG tablet Take 5 mg by mouth daily.   Yes Historical Provider, MD  furosemide (LASIX) 40 MG tablet Take 40 mg by mouth daily.   Yes Historical Provider, MD   magnesium hydroxide (MILK OF MAGNESIA) 400 MG/5ML suspension Take by mouth daily as needed for mild constipation.   Yes Historical Provider, MD  meloxicam (MOBIC) 15 MG tablet Take 15 mg by mouth daily.   Yes Historical Provider, MD  Omega-3 Fatty Acids (FISH OIL) 1000 MG CAPS Take by mouth daily.   Yes Historical Provider, MD   Allergies  Allergen Reactions  . Benadryl [Diphenhydramine Hcl] Other (See Comments)    Urinary retention  . Latex Other (See Comments)    blisters    FAMILY HISTORY:  History reviewed. No pertinent family history. SOCIAL HISTORY:  reports that he has never smoked. He has never used smokeless tobacco. He reports that he does not drink alcohol or use illicit drugs.  REVIEW OF SYSTEMS:   Constitutional:   No  weight loss, night sweats,  Fevers, chills,  +fatigue, or  lassitude.  HEENT:   No headaches,  Difficulty swallowing,  Tooth/dental problems, or  Sore throat,                No sneezing, itching, ear ache, nasal congestion, post nasal drip,   CV:  No chest pain,  Orthopnea, PND, swelling in lower extremities, anasarca, dizziness, palpitations, syncope.   GI  No heartburn, indigestion, abdominal pain, nausea, vomiting, diarrhea, change in bowel habits, loss of appetite, ++bloody stools.   Resp: No shortness of breath with exertion or at rest.  No excess mucus, no productive cough,  No non-productive cough,  No coughing up of blood.  No change in color of mucus.  No wheezing.  No chest wall deformity  Skin: no rash or lesions.  GU: no dysuria, change in color of urine, no urgency or frequency.  No flank pain, no hematuria   MS:  No joint pain or swelling.  No decreased range of motion.  No back pain.  Psych:  No change in mood or affect. No depression or anxiety.  No memory loss.  SUBJECTIVE:  PCCM consulted for pt w/ GIB s/p IR embolization of active duodenal ulcer bleed.   VITAL SIGNS: Temp:  [97.4 F (36.3 C)-98.4 F (36.9 C)] 98.4 F (36.9  C) (04/18 1253) Pulse Rate:  [62-126] 109 (04/18 1700) Resp:  [8-33] 14 (04/18 1700) BP: (82-196)/(25-141) 111/54 mmHg (04/18 1700) SpO2:  [87 %-100 %] 100 % (04/18 1700) HEMODYNAMICS:   VENTILATOR SETTINGS:   INTAKE / OUTPUT: Intake/Output     04/17 0701 - 04/18 0700 04/18 0701 - 04/19 0700   P.O. 240    I.V. (mL/kg) 903 (12.1) 250 (3.4)   Blood 46.5    IV Piggyback 1000    Total Intake(mL/kg) 2189.5 (29.4) 250 (3.4)   Urine (mL/kg/hr) 427    Stool  1    Total Output 428     Net +1761.5 +250        Stool Occurrence 3 x 1 x     PHYSICAL EXAMINATION: General:  Frail elderly male , NAD  Neuro:  Alert, f/c  HEENT:  Dry mucosa  Cardiovascular:  ST ,.no m/r/g Lungs: CTA  Abdomen:  Soft , BS hypoactive  Musculoskeletal:  Intact  Skin:  Pale   LABS:  CBC  Recent Labs Lab 10/26/13 2130 10/26/13 2315 10/27/13 0910  WBC 7.9 10.2 9.7  HGB 5.8* 5.6* 6.5*  HCT 17.4* 16.2* 18.6*  PLT 175 149* 131*   Coag's  Recent Labs Lab 10/26/13 2130  INR 1.19   BMET  Recent Labs Lab 10/26/13 0900 10/27/13 0910  NA 140 142  K 5.2 4.5  CL 106 112  CO2 22 20  BUN 71* 70*  CREATININE 0.88 0.84  GLUCOSE 181* 169*   Electrolytes  Recent Labs Lab 10/26/13 0900 10/27/13 0910  CALCIUM 8.2* 7.2*   Sepsis Markers  Recent Labs Lab 10/26/13 0918  LATICACIDVEN 2.05   ABG No results found for this basename: PHART, PCO2ART, PO2ART,  in the last 168 hours Liver Enzymes  Recent Labs Lab 10/27/13 0910  AST 10  ALT <5  ALKPHOS 43  BILITOT 0.6  ALBUMIN 1.6*   Cardiac Enzymes  Recent Labs Lab 10/26/13 2130 10/27/13 0910  TROPONINI <0.30 <0.30   Glucose  Recent Labs Lab 10/26/13 1508 10/27/13 0745 10/27/13 1700  GLUCAP 145* 167* 172*    Imaging Ct Abdomen Pelvis Wo Contrast  10/27/2013   CLINICAL DATA:  Low hemoglobin.  Question intra-abdominal bleed.  EXAM: CT ABDOMEN AND PELVIS WITHOUT CONTRAST  TECHNIQUE: Multidetector CT imaging of the abdomen  and pelvis was performed following the standard protocol without intravenous contrast.  COMPARISON:  CT ABD/PELVIS W CM dated 04/24/2013  FINDINGS: Numerous bilateral calcified pleural plaques. Mild cardiomegaly. No confluent airspace opacities or effusions.  Small layering gallstones within the gallbladder. Liver, pancreas, spleen, adrenals and kidneys have an unremarkable unenhanced appearance. IVC filter is noted in the infrarenal IVC. Aortic and iliac calcifications without aneurysm.  There is prostate enlargement. There is beam hardening artifact from a left hip replacement which obscures lower pelvic structures. Previously seen large posterior bladder wall diverticulum is partially obscured by the beam hardening artifact. 9 mm calcification in the left side of the pelvis was shown on prior CT to be within this large diverticulum.  No evidence of retroperitoneal hematoma. No free fluid, free air or adenopathy. Stomach, large and small bowel are grossly unremarkable.  Diffuse degenerative changes throughout the thoracolumbar spine. No acute bony abnormality.  IMPRESSION: No retroperitoneal or intra-abdominal hemorrhage.  Cholelithiasis.  Markedly enlarged prostate. Large posterior bladder wall diverticulum noted on prior CT partially obscured by left hip replacement.  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   Electronically Signed   By: Charlett Nose M.D.   On: 10/27/2013 01:30   Dg Chest 2 View  10/26/2013   CLINICAL DATA:  Weakness  EXAM: CHEST  2 VIEW  COMPARISON:  DG CHEST 1 VIEW dated 04/30/2013  FINDINGS: Calcified pleural plaques are reidentified. Healed left clavicular fracture. Heart size is mildly enlarged without evidence for edema. No new pulmonary parenchymal opacity. No pleural effusion.  IMPRESSION: No new acute cardiopulmonary process.  Evidence of asbestos exposure reidentified.   Electronically Signed   By: Christiana Pellant M.D.   On:  10/26/2013 09:36  Ir Angiogram Visceral Selective  10/27/2013   CLINICAL DATA:  80 year old male with acute anemia secondary to active upper GI bleed. On upper endoscopy he was found have not actively bleeding duodenum ulcer. A clip was placed but this was insufficient to control the hemorrhage. The patient was taken urgently to interventional Radiology for angiogram and embolization.  EXAM: SELECTIVE VISCERAL ARTERIOGRAPHY; ARTERIOGRAPHY; IR EMBO ART VEN HEMORR LYMPH EXTRAV INC GUIDE ROADMAPPING; IR ULTRASOUND GUIDANCE VASC ACCESS RIGHT  Date: 10/27/2013  PROCEDURE: 1. Ultrasound-guided puncture of the right common femoral artery 2. Catheterization of celiac artery with arteriogram 3. Catheterization of the common hepatic artery with arteriogram 4. Catheterization of the gastroduodenal artery with arteriogram 5. Catheterization of a super duodenum branch of the gastroduodenal artery with arteriogram 6. Coil embolization of the supra duodenal branch 7. Coil embolization of the gastroduodenal artery 8. Catheterization of the superior mesenteric artery with arteriogram 9. Limited right common femoral arteriogram 10. Application of a Mynx percutaneous arterial closure device Interventional Radiologist:  Criselda Peaches, MD  ANESTHESIA/SEDATION: Moderate (conscious) sedation was used. 4 mg Versed, 50 mcg Fentanyl were administered intravenously. The patient's vital signs were monitored continuously by radiology nursing throughout the procedure.  Sedation Time: 75 minutes  FLUOROSCOPY TIME:  27 min 24 seconds  CONTRAST:  48mL OMNIPAQUE IOHEXOL 300 MG/ML  SOLN  TECHNIQUE: Informed consent was obtained from the patient following explanation of the procedure, risks, benefits and alternatives. The patient understands, agrees and consents for the procedure. All questions were addressed. A time out was performed.  Maximal barrier sterile technique utilized including caps, mask, sterile gowns, sterile gloves, large  sterile drape, hand hygiene, and Betadine skin prep.  The right groin was interrogated with ultrasound. The common femoral artery was found to be widely patent. Local anesthesia was attained by infiltration with 1% lidocaine. A small dermatotomy was made. Under real-time sonographic guidance, the common femoral artery was punctured with a 21 gauge micropuncture needle. Image was obtained and stored for the medical record. Using a transitional 5 French micro sheath, a Bentson wire was advanced of the abdominal aorta. A C2 Cobra catheter was advanced to the aorta. The celiac artery was selected and an arteriogram attempted. However, the C2 catheter achieved insufficient purchase. Therefore, the C2 catheter was exchanged for a Mickelson catheter. The Mickelson catheter was secured is slightly more secure early and celiac artery origin. The celiac arteriogram was performed. There is is relatively conventional hepatic arterial anatomy. The gastroduodenal artery is relatively identified.  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 A Renegade ST micro catheter was then navigated into the gastroduodenal artery. Gastroduodenal arteriogram was performed. No definite active hemorrhage is identified. There is a supraduodenal branch off the proximal GDA. This was catheterized and an arteriogram performed. Again, no active arterial bleeding. However, the vessels are in the immediate vicinity of the endoscopically placed clips. The decision was made to proceed with coil embolization of the supraduodenal arterial branch and gastroduodenal artery to decrease the pressure head and reduce the risk and severity of recurrent bleeding. The supraduodenal branch was coil embolized using first a 2 x 4 x 41 mm interlock diamond coil followed by a 3 x 120 mm interlock coil. The micro catheter was then advanced into the  gastroduodenal artery at its junction with the right gastric artery. The vessel was then coil embolized using first a 6 x 100  mm interlock coil followed by a single 5 x 80 mm coil and two 5 x 150 mm coils. The vessel was embolized nearly to the origin. The micro catheter was pulled back into the common hepatic artery and a post embolization arteriogram was performed confirming no further flow into the GDA or supraduodenal branch vessels.  The micro catheter was removed. The Mickelson catheter was navigated in this SMA. A superior mesenteric arteriogram was performed. No definite vascularity in the region of the clips. No retrograde filling of the coil packs. The catheter was removed.  Through the access sheath, a limited right common femoral arteriogram was performed. The access is within the common femoral artery above the bifurcation below the inferior genu of the inferior epigastric artery. The groin was re-prepped with chlorhexidine. 2 g Ancef was administered intravenously. Arterial hemostasis was achieved with the assistance of a Mynx Ace arterial closure device.  IMPRESSION: 1. Successful coil embolization of the gastroduodenal artery and proximal supraduodenal branch in the region of the endoscopically placed clips. 2. No definite active arterial hemorrhage was identified during the angiogram. 3. The patient received an additional unit of blood in interventional radiology in addition to the 1 unit received in endoscopy for a total of 2 units during his procedural course this afternoon. 4. By the end the procedure, the patient had attained hemodynamic stability.  Signed,  Criselda Peaches, MD  [facility]  [facility]   Electronically Signed   By: Jacqulynn Cadet M.D.   On: 10/27/2013 17:07   Ir Angiogram Visceral Selective  10/27/2013   CLINICAL DATA:  80 year old male with acute anemia secondary to active upper GI bleed. On upper endoscopy he was found  have not actively bleeding duodenum ulcer. A clip was placed but this was insufficient to control the hemorrhage. The patient was taken urgently to interventional Radiology for angiogram and embolization.  EXAM: SELECTIVE VISCERAL ARTERIOGRAPHY; ARTERIOGRAPHY; IR EMBO ART VEN HEMORR LYMPH EXTRAV INC GUIDE ROADMAPPING; IR ULTRASOUND GUIDANCE VASC ACCESS RIGHT  Date: 10/27/2013  PROCEDURE: 1. Ultrasound-guided puncture of the right common femoral artery 2. Catheterization of celiac artery with arteriogram 3. Catheterization of the common hepatic artery with arteriogram 4. Catheterization of the gastroduodenal artery with arteriogram 5. Catheterization of a super duodenum branch of the gastroduodenal artery with arteriogram 6. Coil embolization of the supra duodenal branch 7. Coil embolization of the gastroduodenal artery 8. Catheterization of the superior mesenteric artery with arteriogram 9. Limited right common femoral arteriogram 10. Application of a Mynx percutaneous arterial closure device Interventional Radiologist:  Criselda Peaches, MD  ANESTHESIA/SEDATION: Moderate (conscious) sedation was used. 4 mg Versed, 50 mcg Fentanyl were administered intravenously. The patient's vital signs were monitored continuously by radiology nursing throughout the procedure.  Sedation Time: 75 minutes  FLUOROSCOPY TIME:  27 min 24 seconds  CONTRAST:  49mL OMNIPAQUE IOHEXOL 300 MG/ML  SOLN  TECHNIQUE: Informed consent was obtained from the patient following explanation of the procedure, risks, benefits and alternatives. The patient understands, agrees and consents for the procedure. All questions were addressed. A time out was performed.  Maximal barrier sterile technique utilized including caps, mask, sterile gowns, sterile gloves, large sterile drape, hand hygiene, and Betadine skin prep.  The right groin was interrogated with ultrasound. The common femoral artery was found to be widely patent. Local anesthesia was attained by  infiltration with 1% lidocaine. A small dermatotomy was made. Under real-time sonographic guidance, the common femoral artery was punctured with a  21 gauge micropuncture needle. Image was obtained and stored for the medical record. Using a transitional 5 French micro sheath, a Bentson wire was advanced of the abdominal aorta. A C2 Cobra catheter was advanced to the aorta. The celiac artery was selected and an arteriogram attempted. However, the C2 catheter achieved insufficient purchase. Therefore, the C2 catheter was exchanged for a Mickelson catheter. The Mickelson catheter was secured is slightly more secure early and celiac artery origin. The celiac arteriogram was performed. There is is relatively conventional hepatic arterial anatomy. The gastroduodenal artery is relatively identified.  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 A Renegade ST micro catheter was then navigated into the gastroduodenal artery. Gastroduodenal arteriogram was performed. No definite active hemorrhage is identified. There is a supraduodenal branch off the proximal GDA. This was catheterized and an arteriogram performed. Again, no active arterial bleeding. However, the vessels are in the immediate vicinity of the endoscopically placed clips. The decision was made to proceed with coil embolization of the supraduodenal arterial branch and gastroduodenal artery to decrease the pressure head and reduce the risk and severity of recurrent bleeding. The supraduodenal branch was coil embolized using first a 2 x 4 x 41 mm interlock diamond coil followed by a 3 x 120 mm interlock coil. The micro catheter was then advanced into the gastroduodenal artery at its junction with the right gastric artery. The vessel was then coil embolized using first a 6 x 100 mm interlock coil followed by a single 5 x 80 mm coil and two 5 x 150 mm  coils. The vessel was embolized nearly to the origin. The micro catheter was pulled back into the common hepatic artery and a post embolization arteriogram was performed confirming no further flow into the GDA or supraduodenal branch vessels.  The micro catheter was removed. The Mickelson catheter was navigated in this SMA. A superior mesenteric arteriogram was performed. No definite vascularity in the region of the clips. No retrograde filling of the coil packs. The catheter was removed.  Through the access sheath, a limited right common femoral arteriogram was performed. The access is within the common femoral artery above the bifurcation below the inferior genu of the inferior epigastric artery. The groin was re-prepped with chlorhexidine. 2 g Ancef was administered intravenously. Arterial hemostasis was achieved with the assistance of a Mynx Ace arterial closure device.  IMPRESSION: 1. Successful coil embolization of the gastroduodenal artery and proximal supraduodenal branch in the region of the endoscopically placed clips. 2. No definite active arterial hemorrhage was identified during the angiogram. 3. The patient received an additional unit of blood in interventional radiology in addition to the 1 unit received in endoscopy for a total of 2 units during his procedural course this afternoon. 4. By the end the procedure, the patient had attained hemodynamic stability.  Signed,  Criselda Peaches, MD  [facility]  [facility]   Electronically Signed   By: Jacqulynn Cadet M.D.   On: 10/27/2013 17:07   Ir Angiogram Follow Up Study  10/27/2013   CLINICAL DATA:  80 year old male with acute anemia secondary to active upper GI bleed. On upper endoscopy he was found have not actively bleeding duodenum ulcer. A clip was placed but this was insufficient to control the hemorrhage. The patient was taken urgently to interventional Radiology for angiogram and embolization.   EXAM: SELECTIVE VISCERAL ARTERIOGRAPHY;  ARTERIOGRAPHY; IR EMBO ART VEN HEMORR LYMPH EXTRAV INC GUIDE ROADMAPPING; IR ULTRASOUND GUIDANCE VASC ACCESS RIGHT  Date: 10/27/2013  PROCEDURE: 1. Ultrasound-guided puncture of the right common femoral artery 2. Catheterization of celiac artery with arteriogram 3. Catheterization of the common hepatic artery with arteriogram 4. Catheterization of the gastroduodenal artery with arteriogram 5. Catheterization of a super duodenum branch of the gastroduodenal artery with arteriogram 6. Coil embolization of the supra duodenal branch 7. Coil embolization of the gastroduodenal artery 8. Catheterization of the superior mesenteric artery with arteriogram 9. Limited right common femoral arteriogram 10. Application of a Mynx percutaneous arterial closure device Interventional Radiologist:  Criselda Peaches, MD  ANESTHESIA/SEDATION: Moderate (conscious) sedation was used. 4 mg Versed, 50 mcg Fentanyl were administered intravenously. The patient's vital signs were monitored continuously by radiology nursing throughout the procedure.  Sedation Time: 75 minutes  FLUOROSCOPY TIME:  27 min 24 seconds  CONTRAST:  68mL OMNIPAQUE IOHEXOL 300 MG/ML  SOLN  TECHNIQUE: Informed consent was obtained from the patient following explanation of the procedure, risks, benefits and alternatives. The patient understands, agrees and consents for the procedure. All questions were addressed. A time out was performed.  Maximal barrier sterile technique utilized including caps, mask, sterile gowns, sterile gloves, large sterile drape, hand hygiene, and Betadine skin prep.  The right groin was interrogated with ultrasound. The common femoral artery was found to be widely patent. Local anesthesia was attained by infiltration with 1% lidocaine. A small dermatotomy was made. Under real-time sonographic guidance, the common femoral artery was punctured with a 21 gauge micropuncture needle. Image was obtained and  stored for the medical record. Using a transitional 5 French micro sheath, a Bentson wire was advanced of the abdominal aorta. A C2 Cobra catheter was advanced to the aorta. The celiac artery was selected and an arteriogram attempted. However, the C2 catheter achieved insufficient purchase. Therefore, the C2 catheter was exchanged for a Mickelson catheter. The Mickelson catheter was secured is slightly more secure early and celiac artery origin. The celiac arteriogram was performed. There is is relatively conventional hepatic arterial anatomy. The gastroduodenal artery is relatively identified.  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 A Renegade ST micro catheter was then navigated into the gastroduodenal artery. Gastroduodenal arteriogram was performed. No definite active hemorrhage is identified. There is a supraduodenal branch off the proximal GDA. This was catheterized and an arteriogram performed. Again, no active arterial bleeding. However, the vessels are in the immediate vicinity of the endoscopically placed clips. The decision was made to proceed with coil embolization of the supraduodenal arterial branch and gastroduodenal artery to decrease the pressure head and reduce the risk and severity of recurrent bleeding. The supraduodenal branch was coil embolized using first a 2 x 4 x 41 mm interlock diamond coil followed by a 3 x 120 mm interlock coil. The micro catheter was then advanced into the gastroduodenal artery at its junction with the right gastric artery. The vessel was then coil embolized using first a 6 x 100 mm interlock coil followed by a single 5 x 80 mm coil and two 5 x 150 mm coils. The vessel was embolized nearly to the origin. The micro catheter was pulled back into the common hepatic artery and a post embolization arteriogram was performed confirming no further flow into the  GDA or supraduodenal branch vessels.  The micro catheter was removed. The  Mickelson catheter was navigated in this SMA. A superior mesenteric arteriogram was performed. No definite vascularity in the region of the clips. No retrograde filling of the coil packs. The catheter was removed.  Through the access sheath, a limited right common femoral arteriogram was performed. The access is within the common femoral artery above the bifurcation below the inferior genu of the inferior epigastric artery. The groin was re-prepped with chlorhexidine. 2 g Ancef was administered intravenously. Arterial hemostasis was achieved with the assistance of a Mynx Ace arterial closure device.  IMPRESSION: 1. Successful coil embolization of the gastroduodenal artery and proximal supraduodenal branch in the region of the endoscopically placed clips. 2. No definite active arterial hemorrhage was identified during the angiogram. 3. The patient received an additional unit of blood in interventional radiology in addition to the 1 unit received in endoscopy for a total of 2 units during his procedural course this afternoon. 4. By the end the procedure, the patient had attained hemodynamic stability.  Signed,  Criselda Peaches, MD  [facility]  [facility]   Electronically Signed   By: Jacqulynn Cadet M.D.   On: 10/27/2013 17:07   Ir US Guide Vasc Access Right  10/27/2013   CLINICAL DATA:  79 year old male with acute anemia secondary to active upper GI bleed. On upper endoscopy he was found have not actively bleeding duodenum ulcer. A clip was placed but this was insufficient to control the hemorrhage. The patient was taken urgently to interventional Radiology for angiogram and embolization.  EXAM: SELECTIVE VISCERAL ARTERIOGRAPHY; ARTERIOGRAPHY; IR EMBO ART VEN HEMORR LYMPH EXTRAV INC GUIDE ROADMAPPING; IR ULTRASOUND GUIDANCE VASC ACCESS RIGHT  Date: 10/27/2013  PROCEDURE: 1.  Ultrasound-guided puncture of the right common femoral artery 2. Catheterization of celiac artery with arteriogram 3. Catheterization of the common hepatic artery with arteriogram 4. Catheterization of the gastroduodenal artery with arteriogram 5. Catheterization of a super duodenum branch of the gastroduodenal artery with arteriogram 6. Coil embolization of the supra duodenal branch 7. Coil embolization of the gastroduodenal artery 8. Catheterization of the superior mesenteric artery with arteriogram 9. Limited right common femoral arteriogram 10. Application of a Mynx percutaneous arterial closure device Interventional Radiologist:  Criselda Peaches, MD  ANESTHESIA/SEDATION: Moderate (conscious) sedation was used. 4 mg Versed, 50 mcg Fentanyl were administered intravenously. The patient's vital signs were monitored continuously by radiology nursing throughout the procedure.  Sedation Time: 75 minutes  FLUOROSCOPY TIME:  27 min 24 seconds  CONTRAST:  34mL OMNIPAQUE IOHEXOL 300 MG/ML  SOLN  TECHNIQUE: Informed consent was obtained from the patient following explanation of the procedure, risks, benefits and alternatives. The patient understands, agrees and consents for the procedure. All questions were addressed. A time out was performed.  Maximal barrier sterile technique utilized including caps, mask, sterile gowns, sterile gloves, large sterile drape, hand hygiene, and Betadine skin prep.  The right groin was interrogated with ultrasound. The common femoral artery was found to be widely patent. Local anesthesia was attained by infiltration with 1% lidocaine. A small dermatotomy was made. Under real-time sonographic guidance, the common femoral artery was punctured with a 21 gauge micropuncture needle. Image was obtained and stored for the medical record. Using a transitional 5 French micro sheath, a Bentson wire was advanced of the abdominal aorta. A C2 Cobra catheter was advanced to the aorta. The celiac artery  was selected and an arteriogram attempted. However, the C2 catheter achieved insufficient purchase. Therefore, the C2 catheter was exchanged for  a Mickelson catheter. The Mickelson catheter was secured is slightly more secure early and celiac artery origin. The celiac arteriogram was performed. There is is relatively conventional hepatic arterial anatomy. The gastroduodenal artery is relatively identified.  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 A Renegade ST micro catheter was then navigated into the gastroduodenal artery. Gastroduodenal arteriogram was performed. No definite active hemorrhage is identified. There is a supraduodenal branch off the proximal GDA. This was catheterized and an arteriogram performed. Again, no active arterial bleeding. However, the vessels are in the immediate vicinity of the endoscopically placed clips. The decision was made to proceed with coil embolization of the supraduodenal arterial branch and gastroduodenal artery to decrease the pressure head and reduce the risk and severity of recurrent bleeding. The supraduodenal branch was coil embolized using first a 2 x 4 x 41 mm interlock diamond coil followed by a 3 x 120 mm interlock coil. The micro catheter was then advanced into the gastroduodenal artery at its junction with the right gastric artery. The vessel was then coil embolized using first a 6 x 100 mm interlock coil followed by a single 5 x 80 mm coil and two 5 x 150 mm coils. The vessel was embolized nearly to the origin. The micro catheter was pulled back into the common hepatic artery and a post embolization arteriogram was performed confirming no further flow into the GDA or supraduodenal branch vessels.  The micro catheter was removed. The Mickelson catheter was navigated in this SMA. A superior mesenteric arteriogram was performed. No definite vascularity  in the region of the clips. No retrograde filling of the coil packs. The catheter was removed.  Through the access sheath, a limited right common femoral arteriogram was performed. The access is within the common femoral artery above the bifurcation below the inferior genu of the inferior epigastric artery. The groin was re-prepped with chlorhexidine. 2 g Ancef was administered intravenously. Arterial hemostasis was achieved with the assistance of a Mynx Ace arterial closure device.  IMPRESSION: 1. Successful coil embolization of the gastroduodenal artery and proximal supraduodenal branch in the region of the endoscopically placed clips. 2. No definite active arterial hemorrhage was identified during the angiogram. 3. The patient received an additional unit of blood in interventional radiology in addition to the 1 unit received in endoscopy for a total of 2 units during his procedural course this afternoon. 4. By the end the procedure, the patient had attained hemodynamic stability.  Signed,  Criselda Peaches, MD  [facility]  [facility]   Electronically Signed   By: Jacqulynn Cadet M.D.   On: 10/27/2013 17:07   [place] Guide Roadmapping  10/27/2013   CLINICAL DATA:  80 year old male with acute anemia secondary to active upper GI bleed. On upper endoscopy he was found have not actively bleeding duodenum ulcer. A clip was placed but this was insufficient to control the hemorrhage. The patient was taken urgently to interventional Radiology for angiogram and embolization.  EXAM: SELECTIVE VISCERAL ARTERIOGRAPHY; ARTERIOGRAPHY; IR EMBO ART VEN HEMORR LYMPH EXTRAV INC GUIDE ROADMAPPING; IR ULTRASOUND GUIDANCE VASC ACCESS RIGHT  Date: 10/27/2013  PROCEDURE: 1. Ultrasound-guided puncture of the right common femoral artery 2. Catheterization of celiac artery with arteriogram 3. Catheterization of the common hepatic artery with arteriogram  4. Catheterization of the gastroduodenal artery with  arteriogram 5. Catheterization of a super duodenum branch of the gastroduodenal artery with arteriogram 6. Coil embolization of the supra duodenal branch 7. Coil embolization of the gastroduodenal artery 8. Catheterization of the superior mesenteric artery with arteriogram 9. Limited right common femoral arteriogram 10. Application of a Mynx percutaneous arterial closure device Interventional Radiologist:  Criselda Peaches, MD  ANESTHESIA/SEDATION: Moderate (conscious) sedation was used. 4 mg Versed, 50 mcg Fentanyl were administered intravenously. The patient's vital signs were monitored continuously by radiology nursing throughout the procedure.  Sedation Time: 75 minutes  FLUOROSCOPY TIME:  27 min 24 seconds  CONTRAST:  32mL OMNIPAQUE IOHEXOL 300 MG/ML  SOLN  TECHNIQUE: Informed consent was obtained from the patient following explanation of the procedure, risks, benefits and alternatives. The patient understands, agrees and consents for the procedure. All questions were addressed. A time out was performed.  Maximal barrier sterile technique utilized including caps, mask, sterile gowns, sterile gloves, large sterile drape, hand hygiene, and Betadine skin prep.  The right groin was interrogated with ultrasound. The common femoral artery was found to be widely patent. Local anesthesia was attained by infiltration with 1% lidocaine. A small dermatotomy was made. Under real-time sonographic guidance, the common femoral artery was punctured with a 21 gauge micropuncture needle. Image was obtained and stored for the medical record. Using a transitional 5 French micro sheath, a Bentson wire was advanced of the abdominal aorta. A C2 Cobra catheter was advanced to the aorta. The celiac artery was selected and an arteriogram attempted. However, the C2 catheter achieved insufficient purchase. Therefore, the C2 catheter was exchanged for a Mickelson catheter. The  Mickelson catheter was secured is slightly more secure early and celiac artery origin. The celiac arteriogram was performed. There is is relatively conventional hepatic arterial anatomy. The gastroduodenal artery is relatively identified.  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 A Renegade ST micro catheter was then navigated into the gastroduodenal artery. Gastroduodenal arteriogram was performed. No definite active hemorrhage is identified. There is a supraduodenal branch off the proximal GDA. This was catheterized and an arteriogram performed. Again, no active arterial bleeding. However, the vessels are in the immediate vicinity of the endoscopically placed clips. The decision was made to proceed with coil embolization of the supraduodenal arterial branch and gastroduodenal artery to decrease the pressure head and reduce the risk and severity of recurrent bleeding. The supraduodenal branch was coil embolized using first a 2 x 4 x 41 mm interlock diamond coil followed by a 3 x 120 mm interlock coil. The micro catheter was then advanced into the gastroduodenal artery at its junction with the right gastric artery. The vessel was then coil embolized using first a 6 x 100 mm interlock coil followed by a single 5 x 80 mm coil and two 5 x 150 mm coils. The vessel was embolized nearly to the origin. The micro catheter was pulled back into the common hepatic artery and a post embolization arteriogram was performed confirming no further flow into the GDA or supraduodenal branch vessels.  The micro catheter was removed. The Mickelson catheter was navigated in this SMA. A superior mesenteric arteriogram was performed. No definite vascularity in the region of the clips. No retrograde filling of the coil packs. The catheter was removed.  Through the access sheath, a limited right common femoral arteriogram was  performed. The access is within the common femoral artery  above the bifurcation below the inferior genu of the inferior epigastric artery. The groin was re-prepped with chlorhexidine. 2 g Ancef was administered intravenously. Arterial hemostasis was achieved with the assistance of a Mynx Ace arterial closure device.  IMPRESSION: 1. Successful coil embolization of the gastroduodenal artery and proximal supraduodenal branch in the region of the endoscopically placed clips. 2. No definite active arterial hemorrhage was identified during the angiogram. 3. The patient received an additional unit of blood in interventional radiology in addition to the 1 unit received in endoscopy for a total of 2 units during his procedural course this afternoon. 4. By the end the procedure, the patient had attained hemodynamic stability.  Signed,  Criselda Peaches, MD  [facility]  [facility]iology   Electronically Signed   By: Jacqulynn Cadet M.D.   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, asbestosis P:   O2 for sat >90%  pcxr now  CARDIOVASCULAR A: Hemorrhagic Shock/Hypotension improved with fluids/transfusion  P:  Continue IVF  Monitor closely  Hold home lasix  Keep large bore IV Low threshold cordis Tele Cbc q6h  RENAL A:  Renal insufficiency, at risk atn and hyperk after 7 units P:   Monitor bmet STAT and in am  I/O q shift  resusc with products NOT cyrstalloid  GASTROINTESTINAL A:  GIB with duodenal Ulcer w/ active bleed s/p embolization in IR   P:   Follow closely  GI following  PPI  NPO   HEMATOLOGIC A:  Anemia -acute blood loss, high risk consumptive coagulapthy P:  S/p transfusion  Follow cbc closely -q 6 hr  SCD  Stat coags  INFECTIOUS A:  No apparent infection  P:   Monitor  Tr temp/wbc  pcxr for  ENDOCRINE A:   P:   Add SSI if bs >180   NEUROLOGIC A:   P:   Monitor  Avoid  benzo  418 : Updated family , sons at bedside, pt is a DNR .   TODAY'S SUMMARY:  Cbc, improved bP, need coags  I have personally obtained a history, examined the patient, evaluated laboratory and imaging results, formulated the assessment and plan and placed orders. CRITICAL CARE:  Lavon Paganini. Titus Mould, MD, [place] Pgr: [place] NP -C  Pulmonary and [place] Pager: [phone]  10/27/2013, 5:36 PM

## 2013-10-27 NOTE — Progress Notes (Signed)
Subjective: Patient had bloody BM last night, Hgb dropped to 5.6 despite 2 units of blood.  Overnight team ordered 2 more units and notified Dr. Coralyn Helling for EGD this AM if BP stable. Patient conversant this AM, initially concerned that family was not at bedside. He has no complaints, denies abdominal pain, no emesis. Returned at 9:45am and update son at bedside, still no complaints from MR [place] Objective: Vital signs in last 24 hours: Filed Vitals:   10/27/13 0700 10/27/13 0715 10/27/13 0730 10/27/13 0815  BP: 96/45 119/56 111/54 97/45  Pulse: 70 94 87 82  Temp:  97.9 F (36.6 C)    TempSrc:  Oral    Resp: 14 22 17 16   Height:      Weight:      SpO2: 100% 100% 100% 100%   Weight change:   Intake/Output Summary (Last 24 hours) at 10/27/13 1020 Last data filed at 10/27/13 0845  Gross per 24 hour  Intake 2189.5 ml  Output    428 ml  Net 1761.5 ml   General: resting in bed, NAD HEENT: EOMI, no scleral icterus Cardiac: RRR, 2/6 systolic murmur Pulm: clear to auscultation bilaterally, moving normal volumes of air, no rales Abd: soft, nontender, nondistended, BS present Ext: warm and well perfused, no pedal edema Neuro: alert and oriented X3 Lab Results: Basic Metabolic Panel:  Recent Labs Lab 10/26/13 0900 10/27/13 0910  NA 140 142  K 5.2 4.5  CL 106 112  CO2 22 20  GLUCOSE 181* 169*  BUN 71* 70*  CREATININE 0.88 0.84  CALCIUM 8.2* 7.2*   Liver Function Tests:  Recent Labs Lab 10/27/13 0910  AST 10  ALT <5  ALKPHOS 43  BILITOT 0.6  PROT 3.6*  ALBUMIN 1.6*   CBC:  Recent Labs Lab 10/26/13 0900  10/26/13 2315 10/27/13 0910  WBC 10.9*  < > 10.2 9.7  NEUTROABS 10.0*  --   --   --   HGB 6.2*  < > 5.6* 6.5*  HCT 18.8*  < > 16.2* 18.6*  MCV 82.1  < > 81.4 83.8  PLT 228  < > 149* 131*  < > = values in this interval not displayed. Cardiac Enzymes:  Recent Labs Lab 10/26/13 2130 10/27/13 0910  TROPONINI <0.30 <0.30   CBG:  Recent  Labs Lab 10/26/13 1508 10/27/13 0745  GLUCAP 145* 167*   Coagulation:  Recent Labs Lab 10/26/13 2130  LABPROT 14.8  INR 1.19   Urine Drug Screen: Drugs of Abuse     Component Value Date/Time   LABOPIA NONE DETECTED 10/26/2013 1400   COCAINSCRNUR NONE DETECTED 10/26/2013 1400   LABBENZ NONE DETECTED 10/26/2013 1400   AMPHETMU NONE DETECTED 10/26/2013 1400   THCU NONE DETECTED 10/26/2013 1400   LABBARB NONE DETECTED 10/26/2013 1400    Alcohol Level: No results found for this basename: ETH,  in the last 168 hours Urinalysis:  Recent Labs Lab 10/26/13 1400  COLORURINE YELLOW  LABSPEC 1.022  PHURINE 5.0  GLUCOSEU NEGATIVE  HGBUR LARGE*  BILIRUBINUR NEGATIVE  KETONESUR NEGATIVE  PROTEINUR 30*  UROBILINOGEN 0.2  NITRITE NEGATIVE  LEUKOCYTESUR LARGE*    Micro Results: Recent Results (from the past 240 hour(s))  MRSA PCR SCREENING     Status: None   Collection Time    10/26/13  3:20 PM      Result Value Ref Range Status   MRSA by PCR NEGATIVE  NEGATIVE Final   Comment:  The GeneXpert MRSA Assay (FDA     approved for NASAL specimens     only), is one component of a     comprehensive MRSA colonization     surveillance program. It is not     intended to diagnose MRSA     infection nor to guide or     monitor treatment for     MRSA infections.   Studies/Results: Ct Abdomen Pelvis Wo Contrast  10/27/2013   CLINICAL DATA:  Low hemoglobin.  Question intra-abdominal bleed.  EXAM: CT ABDOMEN AND PELVIS WITHOUT CONTRAST  TECHNIQUE: Multidetector CT imaging of the abdomen and pelvis was performed following the standard protocol without intravenous contrast.  COMPARISON:  CT ABD/PELVIS W CM dated 04/24/2013  FINDINGS: Numerous bilateral calcified pleural plaques. Mild cardiomegaly. No confluent airspace opacities or effusions.  Small layering gallstones within the gallbladder. Liver, pancreas, spleen, adrenals and kidneys have an unremarkable unenhanced appearance. IVC  filter is noted in the infrarenal IVC. Aortic and iliac calcifications without aneurysm.  There is prostate enlargement. There is beam hardening artifact from a left hip replacement which obscures lower pelvic structures. Previously seen large posterior bladder wall diverticulum is partially obscured by the beam hardening artifact. 9 mm calcification in the left side of the pelvis was shown on prior CT to be within this large diverticulum.  No evidence of retroperitoneal hematoma. No free fluid, free air or adenopathy. Stomach, large and small bowel are grossly unremarkable.  Diffuse degenerative changes throughout the thoracolumbar spine. No acute bony abnormality.  IMPRESSION: No retroperitoneal or intra-abdominal hemorrhage.  Cholelithiasis.  Markedly enlarged prostate. Large posterior bladder wall diverticulum noted on prior CT partially obscured by left hip replacement.  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   Electronically Signed   By: Rolm Baptise M.D.   On: 10/27/2013 01:30   Dg Chest 2 View  10/26/2013   CLINICAL DATA:  Weakness  EXAM: CHEST  2 VIEW  COMPARISON:  DG CHEST 1 VIEW dated 04/30/2013  FINDINGS: Calcified pleural plaques are reidentified. Healed left clavicular fracture. Heart size is mildly enlarged without evidence for edema. No new pulmonary parenchymal opacity. No pleural effusion.  IMPRESSION: No new acute cardiopulmonary process.  Evidence of asbestos exposure reidentified.   Electronically Signed   By: Conchita Paris M.D.   On: 10/26/2013 09:36   Medications: I have reviewed the patient's current medications. Scheduled Meds: . pantoprazole (PROTONIX) IV  40 mg Intravenous Q12H  . sodium chloride  500 mL Intravenous Once  . sodium chloride  500 mL Intravenous Once  . sodium chloride  3 mL Intravenous Q12H   Continuous Infusions: . sodium chloride 125 mL/hr at 10/26/13 2000  . sodium chloride 20 mL/hr at 10/26/13 1635    PRN Meds:. Assessment/Plan:  Acute blood loss anemia due to likely  Acute Upper GI Bleed - Patient with history of NSAID use, elevated BUN, history of melena, and bloody BM this AM. - Hgb post 4 units 6.5 this AM.  Will transfuse additional 2 units post transfusion CBC - Last BP 97/45, HR 82 Bolus 500 cc now, continue to monitor BP closely -  Dr Benson Norway to do EGD this AM if BP stable. -Continue IV protnix BID    Hypotension -Due to GI bleed. Bolus 500cc now, transfusing 2 units.    Cancer of dome of urinary bladder  -Urology notified that patient is inpatient with gross hematuria (although resolved currently) - No urgent consult required but  Dr. Tresa Moore will see patient this weekend.  -CT abdomen/ pelvis shows no signs of metastatic disease - When Hgb stable post EGD will consider CT of chest to rule out metastatic disease.  Dispo: Disposition is deferred at this time, awaiting improvement of current medical problems.    The patient does have a current PCP Redmond Pulling Arna Medici, MD) and does not need an [facility] hospital follow-up appointment after discharge.  The patient does not have transportation limitations that hinder transportation to clinic appointments.  .Services Needed at time of discharge: Y = Yes, Blank = No PT:   OT:   RN:   Equipment:   Other:     LOS: 1 day   Joni Reining, DO 10/27/2013, 10:20 AM

## 2013-10-27 NOTE — Sedation Documentation (Addendum)
Pt brought to IR from Endo-MD spoke with pts family about [place] Status issues. Son requests meds/airway only if pts heart stops, requests no CPR, no Defibruilation. Pt has 2nd unit of blood infusing- will continue to monitor blood infusion. No signs of blood reaction at this point.

## 2013-10-27 NOTE — Consult Note (Signed)
Interventional Radiology Consult Note  Date: 10/27/13  Reason for Consult: Active arterial upper GI bleed not amenable to endoscopic control Referring Physician: Jeani Hawking, MD   HPI: Omar [name] is an 80 y.o. male with extensive PMH currently admitted for acute anemia and melena.   H/H 6.2/18.8 on presentation compared to 10.8/33 on 07/26/13.  On EGD today he was found to have a proximal duodenal / duodenal bulb ulcer with fresh clot and active arterial bleeding.  A clip was placed which slowed but did not stop the hemorrhage.  Pt is hypotensive 89/39.  Blood transfusing now.  Pt is somnolent after versed sedation.  I spoke with his son over the phone and explained that he continues to have active bleeding.  I discussed the risks, benefits and alternatives to mesenteric angiogram and embolization with his son.  Risks include stomach/duodenal ischemia, arterial injury, non target embolization, contrast nephropathy.  Son agrees and wishes to proceed with the procedure.   I also specifically discussed his father's wishes regarding his DNT status.  Specifically he does not want his heart to be restarted if it should stop.  No chest compressions or shocks.  Medications and breathing support are ok.   Past Medical History:  Past Medical History  Diagnosis Date  . History of pulmonary embolus (PE)     APRIL 2011--  MULTIPLE PE'S AND DVT  . BPH (benign prostatic hypertrophy)   . History of urinary retention   . History of transurethral destruction of bladder lesion   . H/O asbestos exposure   . History of DVT (deep vein thrombosis)     APRIL 2011--  UPPER AND LOWER EXTREMITIES  . Bladder tumor     recurrent  . Nocturia   . Heart murmur, systolic     enlarged  . Bladder cancer 10/20/12    high grade papillary/invasive urothelial  . Hx of radiation therapy 01/02/13- 02/19/13    subtotal bladder 4500 cGy 25 sessions, boost 1600 cGy 8 sessions  . Skin cancer 03/2013    left cheek  .  Asbestosis   . Fracture   . Hx of radiation therapy 01/02/13- 02/19/13    subtotal bladder 4500 cGy 25 sessions, reduced field boost 1600 cGy 8 sessions    Surgical History:  Past Surgical History  Procedure Laterality Date  . Repair recurrent left inguinal hernia  06-01-1999    x 2  . Transurethral resection of prostate  1999   &  11-17-2000  . Cysto/ eua/ placement suprapubic tube  01-13-2000  . Transurethral resection of bladder tumor  06-30-2010  . Transurethral resection of prostate  07-31-2010    AND BLADDER RESECTION BLADDER TUMOR  . Vena cava filter placement  APRIL 2011    INFERIOR  . Transthoracic echocardiogram  10-11-2012   DR TILLEY    CONCENTRIC LVH WITH NORMAL LVSF/ EF 55%/ GRADE I DIASTOLIC DYSFUNCTION  . Transurethral resection of bladder tumor N/A 10/20/2012    Procedure: TRANSURETHRAL RESECTION OF BLADDER TUMOR (TURBT);  Surgeon: Lindaann Slough, MD;  Location: [facility];  Service: Urology;  Laterality: N/A;  . Cystoscopy N/A 10/20/2012    Procedure: CYSTOSCOPY;  Surgeon: Lindaann Slough, MD;  Location: [facility];  Service: Urology;  Laterality: N/A;  . Cataract surgery      BILAT. EYES  . Mohs surgery  03/2013    left cheek  . Hip arthroplasty Left 05/01/2013    Procedure: ARTHROPLASTY BIPOLAR HIP;  Surgeon: Sheral Apley,  MD;  Location: [place];  Service: Orthopedics;  Laterality: Left;    Family History: History reviewed. No pertinent family history.  Social History:  reports that he has never smoked. He has never used smokeless tobacco. He reports that he does not drink alcohol or use illicit drugs.  Allergies:  Allergies  Allergen Reactions  . Benadryl [Diphenhydramine Hcl] Other (See Comments)    Urinary retention  . Latex Other (See Comments)    blisters    Medications: I have reviewed the patient's current medications.  ROS: See HPI for pertinent findings, otherwise complete 10 system review negative.  Physical  Exam: Blood pressure 108/73, pulse 126, temperature 98.4 F (36.9 C), temperature source Oral, resp. rate 8, height $RemoveB'5\' 10"'tRmWzOIu$  (1.778 m), weight 164 lb 3.9 oz (74.5 kg), SpO2 95.00%. Gen: Thin, elderly somnolent WM Pulm: Breathing is non labored, no accessory muscle activation Cardiac: Sinus tachy Pulses: 2+ symmetric femoral pulses, foot pulses not palpable.  Will doppler in IR    Labs: CBC  Recent Labs  10/26/13 2315 10/27/13 0910  WBC 10.2 9.7  HGB 5.6* 6.5*  HCT 16.2* 18.6*  PLT 149* 131*   MET  Recent Labs  10/26/13 0900 10/27/13 0910  NA 140 142  K 5.2 4.5  CL 106 112  CO2 22 20  GLUCOSE 181* 169*  BUN 71* 70*  CREATININE 0.88 0.84  CALCIUM 8.2* 7.2*    Recent Labs  10/27/13 0910  PROT 3.6*  ALBUMIN 1.6*  AST 10  ALT <5  ALKPHOS 43  BILITOT 0.6   PT/INR  Recent Labs  10/26/13 2130  LABPROT 14.8  INR 1.19   ABG No results found for this basename: PHART, PCO2, PO2, HCO3,  in the last 72 hours    Ct Abdomen Pelvis Wo Contrast  10/27/2013   CLINICAL DATA:  Low hemoglobin.  Question intra-abdominal bleed.  EXAM: CT ABDOMEN AND PELVIS WITHOUT CONTRAST  TECHNIQUE: Multidetector CT imaging of the abdomen and pelvis was performed following the standard protocol without intravenous contrast.  COMPARISON:  CT ABD/PELVIS W CM dated 04/24/2013  FINDINGS: Numerous bilateral calcified pleural plaques. Mild cardiomegaly. No confluent airspace opacities or effusions.  Small layering gallstones within the gallbladder. Liver, pancreas, spleen, adrenals and kidneys have an unremarkable unenhanced appearance. IVC filter is noted in the infrarenal IVC. Aortic and iliac calcifications without aneurysm.  There is prostate enlargement. There is beam hardening artifact from a left hip replacement which obscures lower pelvic structures. Previously seen large posterior bladder wall diverticulum is partially obscured by the beam hardening artifact. 9 mm calcification in the left  side of the pelvis was shown on prior CT to be within this large diverticulum.  No evidence of retroperitoneal hematoma. No free fluid, free air or adenopathy. Stomach, large and small bowel are grossly unremarkable.  Diffuse degenerative changes throughout the thoracolumbar spine. No acute bony abnormality.  IMPRESSION: No retroperitoneal or intra-abdominal hemorrhage.  Cholelithiasis.  Markedly enlarged prostate. Large posterior bladder wall diverticulum noted on prior CT partially obscured by left hip replacement.  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   Electronically Signed   By: Rolm Baptise M.D.   On: 10/27/2013 01:30   Dg Chest 2 View  10/26/2013   CLINICAL DATA:  Weakness  EXAM: CHEST  2 VIEW  COMPARISON:  DG CHEST 1 VIEW dated 04/30/2013  FINDINGS: Calcified pleural plaques are reidentified. Healed left clavicular fracture. Heart size is mildly enlarged without evidence for edema.  No new pulmonary parenchymal opacity. No pleural effusion.  IMPRESSION: No new acute cardiopulmonary process.  Evidence of asbestos exposure reidentified.   Electronically Signed   By: Conchita Paris M.D.   On: 10/26/2013 09:36    Assessment/Plan: Active arterial bleed from proximal duodenal ulcer not amenable to endoscopic control.  1.) Urgent mesenteric angio and embolization  2.) Discussed with son as above  Thank you very much for this interesting consult.   Signed,  Criselda Peaches, MD [facility]

## 2013-10-27 NOTE — H&P (Signed)
INTERNAL MEDICINE TEACHING SERVICE Attending Admission Note  Date: 10/27/2013  Patient name: Omar [name]  Medical record number: [id number]  Date of birth: 06-23-1916    I have seen and evaluated Florene Glen and discussed their care with the Residency Team.  80 yr old man with hx DVT, BPH, bladder CA, presented with syncope. There was reported melenotic bowel movements and possible gross hematuria. The patient was noted to be hypotensive on arrival with a Hgb of 6.2, but responded to IVF volume resuscitation. He has a noted hx of taking NSAIDs. He was seen an evaluated with GI with concern for GI bleed and plans to perform EGD today. Of note, he was noted to have a large bloody BM overnight. He has been transfused 2 units of PRBCs and additional 2 units of PRBCs are currently planned. On exam, Filed Vitals:   10/27/13 0815  BP: 97/45  Pulse: 82  Temp:   Resp: 16   T 97.3 F GEN: AAOx3, NAD CV: S1S2, 2/6 SEM, RRR PULM: CTA bilat ABD/GI: Soft, NT, +BS, no guarding. LE: 2/4 pulses, no c/c/e.  -Acute blood loss anemia: Suspect UGI bleed. Agree with large bore IV, protonix IV bid, H/H q8 hours. Transfuse to keep Hgb above 8. Watch for continued hypotension. EGD planned today. -rest per resident note.  Dominic Pea, DO, [facility] 10/27/2013, 10:17 AM

## 2013-10-28 DIAGNOSIS — K260 Acute duodenal ulcer with hemorrhage: Principal | ICD-10-CM

## 2013-10-28 LAB — URINE CULTURE: Colony Count: 95000

## 2013-10-28 LAB — CBC
HCT: 19.1 % — ABNORMAL LOW (ref 39.0–52.0)
HEMATOCRIT: 20.3 % — AB (ref 39.0–52.0)
HEMATOCRIT: 20.2 % — AB (ref 39.0–52.0)
HEMOGLOBIN: 6.7 g/dL — AB (ref 13.0–17.0)
Hemoglobin: 7.2 g/dL — ABNORMAL LOW (ref 13.0–17.0)
Hemoglobin: 7.1 g/dL — ABNORMAL LOW (ref 13.0–17.0)
MCH: 29 pg (ref 26.0–34.0)
MCH: 29.1 pg (ref 26.0–34.0)
MCH: 29.2 pg (ref 26.0–34.0)
MCHC: 35.5 g/dL (ref 30.0–36.0)
MCHC: 35.1 g/dL (ref 30.0–36.0)
MCHC: 35.1 g/dL (ref 30.0–36.0)
MCV: 81.9 fL (ref 78.0–100.0)
MCV: 83 fL (ref 78.0–100.0)
MCV: 83.1 fL (ref 78.0–100.0)
Platelets: 131 10*3/uL — ABNORMAL LOW (ref 150–400)
Platelets: 137 10*3/uL — ABNORMAL LOW (ref 150–400)
Platelets: 127 10*3/uL — ABNORMAL LOW (ref 150–400)
RBC: 2.48 MIL/uL — ABNORMAL LOW (ref 4.22–5.81)
RBC: 2.3 MIL/uL — AB (ref 4.22–5.81)
RBC: 2.43 MIL/uL — ABNORMAL LOW (ref 4.22–5.81)
RDW: 14.7 % (ref 11.5–15.5)
RDW: 15 % (ref 11.5–15.5)
RDW: 14.8 % (ref 11.5–15.5)
WBC: 14 10*3/uL — ABNORMAL HIGH (ref 4.0–10.5)
WBC: 12.4 10*3/uL — AB (ref 4.0–10.5)
WBC: 12.2 10*3/uL — ABNORMAL HIGH (ref 4.0–10.5)

## 2013-10-28 LAB — PREPARE RBC (CROSSMATCH)

## 2013-10-28 LAB — BASIC METABOLIC PANEL
BUN: 61 mg/dL — ABNORMAL HIGH (ref 6–23)
CO2: 18 meq/L — AB (ref 19–32)
Calcium: 7.6 mg/dL — ABNORMAL LOW (ref 8.4–10.5)
Chloride: 117 mEq/L — ABNORMAL HIGH (ref 96–112)
Creatinine, Ser: 0.77 mg/dL (ref 0.50–1.35)
GFR calc Af Amer: 87 mL/min — ABNORMAL LOW (ref >90)
GFR calc non Af Amer: 75 mL/min — ABNORMAL LOW (ref >90)
GLUCOSE: 137 mg/dL — AB (ref 70–99)
POTASSIUM: 3.7 meq/L (ref 3.7–5.3)
Sodium: 145 mEq/L (ref 137–147)

## 2013-10-28 LAB — GLUCOSE, CAPILLARY: Glucose-Capillary: 147 mg/dL — ABNORMAL HIGH (ref 70–99)

## 2013-10-28 MED ORDER — ACETAMINOPHEN 325 MG PO TABS
650.0000 mg | ORAL_TABLET | Freq: Four times a day (QID) | ORAL | Status: DC | PRN
  Administered 2013-10-28: 650 mg via ORAL
  Filled 2013-10-28: qty 2

## 2013-10-28 MED ORDER — WHITE PETROLATUM GEL
Status: AC
  Administered 2013-10-28: 18:00:00
  Filled 2013-10-28: qty 5

## 2013-10-28 MED ORDER — SUCRALFATE 1 G PO TABS
1.0000 g | ORAL_TABLET | Freq: Three times a day (TID) | ORAL | Status: DC
  Administered 2013-10-28 – 2013-10-31 (×14): 1 g via ORAL
  Filled 2013-10-28 (×18): qty 1

## 2013-10-28 MED ORDER — SODIUM CHLORIDE 0.45 % IV SOLN
INTRAVENOUS | Status: DC
  Administered 2013-10-28: 20 mL/h via INTRAVENOUS
  Administered 2013-10-30: 18:00:00 via INTRAVENOUS

## 2013-10-28 NOTE — Progress Notes (Signed)
Utilization review completed.  

## 2013-10-28 NOTE — Progress Notes (Signed)
1 Day Post-Op  Subjective: Pt without new c/o; still a little pale; denies abd pain,CP,N/V; has had total of 5 dark bloody stools since yesterday evening  Objective: Vital signs in last 24 hours: Temp:  [97.5 F (36.4 C)-98.4 F (36.9 C)] 98.2 F (36.8 C) (04/19 0416) Pulse Rate:  [62-126] 95 (04/19 0700) Resp:  [8-32] 20 (04/19 0700) BP: (84-196)/(28-141) 116/52 mmHg (04/19 0700) SpO2:  [87 %-100 %] 100 % (04/19 0700) Last BM Date: 10/28/13  Intake/Output from previous day: 04/18 0701 - 04/19 0700 In: 1116.3 [I.V.:1116.3] Out: 475 [Urine:475] Intake/Output this shift: Total I/O In: 150 [I.V.:150] Out: 50 [Urine:50]  Pt awake/alert;HOH;  chest- sl dim BS bases; heart- RRR; abd- soft,+BS,NT; right CFA puncture site clean and dry,NT, no hematoma; 1+ distal pulses Lab Results:   Recent Labs  10/28/13 0255 10/28/13 0750  WBC 14.0* 12.4*  HGB 7.2* 6.7*  HCT 20.3* 19.1*  PLT 131* 137*   BMET  Recent Labs  10/27/13 0910 10/27/13 2221  NA 142 144  K 4.5 4.1  CL 112 115*  CO2 20 20  GLUCOSE 169* 161*  BUN 70* 65*  CREATININE 0.84 0.83  CALCIUM 7.2* 7.7*   PT/INR  Recent Labs  10/26/13 2130  LABPROT 14.8  INR 1.19   ABG No results found for this basename: PHART, PCO2, PO2, HCO3,  in the last 72 hours  Studies/Results: Ct Abdomen Pelvis Wo Contrast  10/27/2013   CLINICAL DATA:  Low hemoglobin.  Question intra-abdominal bleed.  EXAM: CT ABDOMEN AND PELVIS WITHOUT CONTRAST  TECHNIQUE: Multidetector CT imaging of the abdomen and pelvis was performed following the standard protocol without intravenous contrast.  COMPARISON:  CT ABD/PELVIS W CM dated 04/24/2013  FINDINGS: Numerous bilateral calcified pleural plaques. Mild cardiomegaly. No confluent airspace opacities or effusions.  Small layering gallstones within the gallbladder. Liver, pancreas, spleen, adrenals and kidneys have an unremarkable unenhanced appearance. IVC filter is noted in the infrarenal IVC. Aortic  and iliac calcifications without aneurysm.  There is prostate enlargement. There is beam hardening artifact from a left hip replacement which obscures lower pelvic structures. Previously seen large posterior bladder wall diverticulum is partially obscured by the beam hardening artifact. 9 mm calcification in the left side of the pelvis was shown on prior CT to be within this large diverticulum.  No evidence of retroperitoneal hematoma. No free fluid, free air or adenopathy. Stomach, large and small bowel are grossly unremarkable.  Diffuse degenerative changes throughout the thoracolumbar spine. No acute bony abnormality.  IMPRESSION: No retroperitoneal or intra-abdominal hemorrhage.  Cholelithiasis.  Markedly enlarged prostate. Large posterior bladder wall diverticulum noted on prior CT partially obscured by left hip replacement.  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   Electronically Signed   By: Rolm Baptise M.D.   On: 10/27/2013 01:30   Ir Angiogram Visceral Selective  10/27/2013   CLINICAL DATA:  80 year old male with acute anemia secondary to active upper GI bleed. On upper endoscopy he was found have not actively bleeding duodenum ulcer. A clip was placed but this was insufficient to control the hemorrhage. The patient was taken urgently to interventional Radiology for angiogram and embolization.  EXAM: SELECTIVE VISCERAL ARTERIOGRAPHY; ARTERIOGRAPHY; IR EMBO ART VEN HEMORR LYMPH EXTRAV INC GUIDE ROADMAPPING; IR ULTRASOUND GUIDANCE VASC ACCESS RIGHT  Date: 10/27/2013  PROCEDURE: 1. Ultrasound-guided puncture of the right common femoral artery 2. Catheterization of celiac artery with arteriogram 3. Catheterization of the common hepatic artery with arteriogram 4.  Catheterization of the gastroduodenal artery with arteriogram 5. Catheterization of a super duodenum branch of the gastroduodenal artery with arteriogram 6. Coil embolization of the supra  duodenal branch 7. Coil embolization of the gastroduodenal artery 8. Catheterization of the superior mesenteric artery with arteriogram 9. Limited right common femoral arteriogram 10. Application of a Mynx percutaneous arterial closure device Interventional Radiologist:  Criselda Peaches, MD  ANESTHESIA/SEDATION: Moderate (conscious) sedation was used. 4 mg Versed, 50 mcg Fentanyl were administered intravenously. The patient's vital signs were monitored continuously by radiology nursing throughout the procedure.  Sedation Time: 75 minutes  FLUOROSCOPY TIME:  27 min 24 seconds  CONTRAST:  27mL OMNIPAQUE IOHEXOL 300 MG/ML  SOLN  TECHNIQUE: Informed consent was obtained from the patient following explanation of the procedure, risks, benefits and alternatives. The patient understands, agrees and consents for the procedure. All questions were addressed. A time out was performed.  Maximal barrier sterile technique utilized including caps, mask, sterile gowns, sterile gloves, large sterile drape, hand hygiene, and Betadine skin prep.  The right groin was interrogated with ultrasound. The common femoral artery was found to be widely patent. Local anesthesia was attained by infiltration with 1% lidocaine. A small dermatotomy was made. Under real-time sonographic guidance, the common femoral artery was punctured with a 21 gauge micropuncture needle. Image was obtained and stored for the medical record. Using a transitional 5 French micro sheath, a Bentson wire was advanced of the abdominal aorta. A C2 Cobra catheter was advanced to the aorta. The celiac artery was selected and an arteriogram attempted. However, the C2 catheter achieved insufficient purchase. Therefore, the C2 catheter was exchanged for a Mickelson catheter. The Mickelson catheter was secured is slightly more secure early and celiac artery origin. The celiac arteriogram was performed. There is is relatively conventional hepatic arterial anatomy. The  gastroduodenal artery is relatively identified.  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 A Renegade ST micro catheter was then navigated into the gastroduodenal artery. Gastroduodenal arteriogram was performed. No definite active hemorrhage is identified. There is a supraduodenal branch off the proximal GDA. This was catheterized and an arteriogram performed. Again, no active arterial bleeding. However, the vessels are in the immediate vicinity of the endoscopically placed clips. The decision was made to proceed with coil embolization of the supraduodenal arterial branch and gastroduodenal artery to decrease the pressure head and reduce the risk and severity of recurrent bleeding. The supraduodenal branch was coil embolized using first a 2 x 4 x 41 mm interlock diamond coil followed by a 3 x 120 mm interlock coil. The micro catheter was then advanced into the gastroduodenal artery at its junction with the right gastric artery. The vessel was then coil embolized using first a 6 x 100 mm interlock coil followed by a single 5 x 80 mm coil and two 5 x 150 mm coils. The vessel was embolized nearly to the origin. The micro catheter was pulled back into the common hepatic artery and a post embolization arteriogram was performed confirming no further flow into the GDA or supraduodenal branch vessels.  The micro catheter was removed. The Mickelson catheter was navigated in this SMA. A superior mesenteric arteriogram was performed. No definite vascularity in the region of the clips. No retrograde filling of the coil packs. The catheter was removed.  Through the access sheath, a limited right common femoral arteriogram was performed. The access  is within the common femoral artery above the bifurcation below the inferior genu of the inferior epigastric artery. The groin was re-prepped with chlorhexidine. 2 g  Ancef was administered intravenously. Arterial hemostasis was achieved with the assistance of a Mynx Ace arterial closure device.  IMPRESSION: 1. Successful coil embolization of the gastroduodenal artery and proximal supraduodenal branch in the region of the endoscopically placed clips. 2. No definite active arterial hemorrhage was identified during the angiogram. 3. The patient received an additional unit of blood in interventional radiology in addition to the 1 unit received in endoscopy for a total of 2 units during his procedural course this afternoon. 4. By the end the procedure, the patient had attained hemodynamic stability.  Signed,  Criselda Peaches, MD  [facility]  [facility]   Electronically Signed   By: Jacqulynn Cadet M.D.   On: 10/27/2013 17:07   Ir Angiogram Visceral Selective  10/27/2013   CLINICAL DATA:  80 year old male with acute anemia secondary to active upper GI bleed. On upper endoscopy he was found have not actively bleeding duodenum ulcer. A clip was placed but this was insufficient to control the hemorrhage. The patient was taken urgently to interventional Radiology for angiogram and embolization.  EXAM: SELECTIVE VISCERAL ARTERIOGRAPHY; ARTERIOGRAPHY; IR EMBO ART VEN HEMORR LYMPH EXTRAV INC GUIDE ROADMAPPING; IR ULTRASOUND GUIDANCE VASC ACCESS RIGHT  Date: 10/27/2013  PROCEDURE: 1. Ultrasound-guided puncture of the right common femoral artery 2. Catheterization of celiac artery with arteriogram 3. Catheterization of the common hepatic artery with arteriogram 4. Catheterization of the gastroduodenal artery with arteriogram 5. Catheterization of a super duodenum branch of the gastroduodenal artery with arteriogram 6. Coil embolization of the supra duodenal branch 7. Coil embolization of the gastroduodenal artery 8. Catheterization of the superior mesenteric artery with arteriogram 9. Limited right common femoral arteriogram 10.  Application of a Mynx percutaneous arterial closure device Interventional Radiologist:  Criselda Peaches, MD  ANESTHESIA/SEDATION: Moderate (conscious) sedation was used. 4 mg Versed, 50 mcg Fentanyl were administered intravenously. The patient's vital signs were monitored continuously by radiology nursing throughout the procedure.  Sedation Time: 75 minutes  FLUOROSCOPY TIME:  27 min 24 seconds  CONTRAST:  71mL OMNIPAQUE IOHEXOL 300 MG/ML  SOLN  TECHNIQUE: Informed consent was obtained from the patient following explanation of the procedure, risks, benefits and alternatives. The patient understands, agrees and consents for the procedure. All questions were addressed. A time out was performed.  Maximal barrier sterile technique utilized including caps, mask, sterile gowns, sterile gloves, large sterile drape, hand hygiene, and Betadine skin prep.  The right groin was interrogated with ultrasound. The common femoral artery was found to be widely patent. Local anesthesia was attained by infiltration with 1% lidocaine. A small dermatotomy was made. Under real-time sonographic guidance, the common femoral artery was punctured with a 21 gauge micropuncture needle. Image was obtained and stored for the medical record. Using a transitional 5 French micro sheath, a Bentson wire was advanced of the abdominal aorta. A C2 Cobra catheter was advanced to the aorta. The celiac artery was selected and an arteriogram attempted. However, the C2 catheter achieved insufficient purchase. Therefore, the C2 catheter was exchanged for a Mickelson catheter. The Mickelson catheter was secured is slightly more secure early and celiac artery origin. The celiac arteriogram was performed. There is is relatively conventional hepatic arterial anatomy. The gastroduodenal artery is relatively identified.  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 A Renegade ST micro catheter was then navigated into the gastroduodenal artery. Gastroduodenal arteriogram was performed. No definite active hemorrhage is identified. There is a supraduodenal branch off the proximal GDA. This was catheterized and an arteriogram performed. Again, no active arterial bleeding. However, the vessels are in the immediate vicinity of the endoscopically placed clips. The decision was made to proceed with coil embolization of the supraduodenal arterial branch and gastroduodenal artery to decrease the pressure head and reduce the risk and severity of recurrent bleeding. The supraduodenal branch was coil embolized using first a 2 x 4 x 41 mm interlock diamond coil followed by a 3 x 120 mm interlock coil. The micro catheter was then advanced into the gastroduodenal artery at its junction with the right gastric artery. The vessel was then coil embolized using first a 6 x 100 mm interlock coil followed by a single 5 x 80 mm coil and two 5 x 150 mm coils. The vessel was embolized nearly to the origin. The micro catheter was pulled back into the common hepatic artery and a post embolization arteriogram was performed confirming no further flow into the GDA or supraduodenal branch vessels.  The micro catheter was removed. The Mickelson catheter was navigated in this SMA. A superior mesenteric arteriogram was performed. No definite vascularity in the region of the clips. No retrograde filling of the coil packs. The catheter was removed.  Through the access sheath, a limited right common femoral arteriogram was performed. The access is within the common femoral artery above the bifurcation below the inferior genu of the inferior epigastric artery. The groin was re-prepped with chlorhexidine. 2 g Ancef was administered intravenously. Arterial hemostasis was achieved with the assistance of a Mynx Ace arterial closure device.  IMPRESSION: 1. Successful coil embolization of the  gastroduodenal artery and proximal supraduodenal branch in the region of the endoscopically placed clips. 2. No definite active arterial hemorrhage was identified during the angiogram. 3. The patient received an additional unit of blood in interventional radiology in addition to the 1 unit received in endoscopy for a total of 2 units during his procedural course this afternoon. 4. By the end the procedure, the patient had attained hemodynamic stability.  Signed,  Criselda Peaches, MD  [facility]  [facility]   Electronically Signed   By: Jacqulynn Cadet M.D.   On: 10/27/2013 17:07   Ir Angiogram Follow Up Study  10/27/2013   CLINICAL DATA:  80 year old male with acute anemia secondary to active upper GI bleed. On upper endoscopy he was found have not actively bleeding duodenum ulcer. A clip was placed but this was insufficient to control the hemorrhage. The patient was taken urgently to interventional Radiology for angiogram and embolization.  EXAM: SELECTIVE VISCERAL ARTERIOGRAPHY; ARTERIOGRAPHY; IR EMBO ART VEN HEMORR LYMPH EXTRAV INC GUIDE ROADMAPPING; IR ULTRASOUND GUIDANCE VASC ACCESS RIGHT  Date: 10/27/2013  PROCEDURE: 1. Ultrasound-guided puncture of the right common femoral artery 2. Catheterization of celiac artery with arteriogram 3. Catheterization of the common hepatic artery with arteriogram 4. Catheterization of the gastroduodenal artery with arteriogram 5. Catheterization of a super duodenum branch of the gastroduodenal artery with arteriogram 6. Coil embolization of the supra duodenal branch 7. Coil embolization of the gastroduodenal artery 8. Catheterization of the superior mesenteric artery with arteriogram 9. Limited right common femoral arteriogram 10. Application of a Mynx percutaneous arterial closure device Interventional Radiologist:  Myrle Sheng  K. Laurence Ferrari, MD  ANESTHESIA/SEDATION: Moderate (conscious) sedation was used. 4 mg Versed, 50 mcg  Fentanyl were administered intravenously. The patient's vital signs were monitored continuously by radiology nursing throughout the procedure.  Sedation Time: 75 minutes  FLUOROSCOPY TIME:  27 min 24 seconds  CONTRAST:  39mL OMNIPAQUE IOHEXOL 300 MG/ML  SOLN  TECHNIQUE: Informed consent was obtained from the patient following explanation of the procedure, risks, benefits and alternatives. The patient understands, agrees and consents for the procedure. All questions were addressed. A time out was performed.  Maximal barrier sterile technique utilized including caps, mask, sterile gowns, sterile gloves, large sterile drape, hand hygiene, and Betadine skin prep.  The right groin was interrogated with ultrasound. The common femoral artery was found to be widely patent. Local anesthesia was attained by infiltration with 1% lidocaine. A small dermatotomy was made. Under real-time sonographic guidance, the common femoral artery was punctured with a 21 gauge micropuncture needle. Image was obtained and stored for the medical record. Using a transitional 5 French micro sheath, a Bentson wire was advanced of the abdominal aorta. A C2 Cobra catheter was advanced to the aorta. The celiac artery was selected and an arteriogram attempted. However, the C2 catheter achieved insufficient purchase. Therefore, the C2 catheter was exchanged for a Mickelson catheter. The Mickelson catheter was secured is slightly more secure early and celiac artery origin. The celiac arteriogram was performed. There is is relatively conventional hepatic arterial anatomy. The gastroduodenal artery is relatively identified.  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 A Renegade ST micro catheter was then navigated into the gastroduodenal artery. Gastroduodenal arteriogram was performed. No definite active hemorrhage is identified. There is a  supraduodenal branch off the proximal GDA. This was catheterized and an arteriogram performed. Again, no active arterial bleeding. However, the vessels are in the immediate vicinity of the endoscopically placed clips. The decision was made to proceed with coil embolization of the supraduodenal arterial branch and gastroduodenal artery to decrease the pressure head and reduce the risk and severity of recurrent bleeding. The supraduodenal branch was coil embolized using first a 2 x 4 x 41 mm interlock diamond coil followed by a 3 x 120 mm interlock coil. The micro catheter was then advanced into the gastroduodenal artery at its junction with the right gastric artery. The vessel was then coil embolized using first a 6 x 100 mm interlock coil followed by a single 5 x 80 mm coil and two 5 x 150 mm coils. The vessel was embolized nearly to the origin. The micro catheter was pulled back into the common hepatic artery and a post embolization arteriogram was performed confirming no further flow into the GDA or supraduodenal branch vessels.  The micro catheter was removed. The Mickelson catheter was navigated in this SMA. A superior mesenteric arteriogram was performed. No definite vascularity in the region of the clips. No retrograde filling of the coil packs. The catheter was removed.  Through the access sheath, a limited right common femoral arteriogram was performed. The access is within the common femoral artery above the bifurcation below the inferior genu of the inferior epigastric artery. The groin was re-prepped with chlorhexidine. 2 g Ancef was administered intravenously. Arterial hemostasis was achieved with the assistance of a Mynx Ace arterial closure device.  IMPRESSION: 1. Successful coil embolization of the gastroduodenal artery and proximal supraduodenal branch in the region of the  endoscopically placed clips. 2. No definite active arterial hemorrhage was identified during the angiogram. 3. The patient  received an additional unit of blood in interventional radiology in addition to the 1 unit received in endoscopy for a total of 2 units during his procedural course this afternoon. 4. By the end the procedure, the patient had attained hemodynamic stability.  Signed,  Criselda Peaches, MD  [facility]  [facility]   Electronically Signed   By: Jacqulynn Cadet M.D.   On: 10/27/2013 17:07   Ir US Guide Vasc Access Right  10/27/2013   CLINICAL DATA:  80 year old male with acute anemia secondary to active upper GI bleed. On upper endoscopy he was found have not actively bleeding duodenum ulcer. A clip was placed but this was insufficient to control the hemorrhage. The patient was taken urgently to interventional Radiology for angiogram and embolization.  EXAM: SELECTIVE VISCERAL ARTERIOGRAPHY; ARTERIOGRAPHY; IR EMBO ART VEN HEMORR LYMPH EXTRAV INC GUIDE ROADMAPPING; IR ULTRASOUND GUIDANCE VASC ACCESS RIGHT  Date: 10/27/2013  PROCEDURE: 1. Ultrasound-guided puncture of the right common femoral artery 2. Catheterization of celiac artery with arteriogram 3. Catheterization of the common hepatic artery with arteriogram 4. Catheterization of the gastroduodenal artery with arteriogram 5. Catheterization of a super duodenum branch of the gastroduodenal artery with arteriogram 6. Coil embolization of the supra duodenal branch 7. Coil embolization of the gastroduodenal artery 8. Catheterization of the superior mesenteric artery with arteriogram 9. Limited right common femoral arteriogram 10. Application of a Mynx percutaneous arterial closure device Interventional Radiologist:  Criselda Peaches, MD  ANESTHESIA/SEDATION: Moderate (conscious) sedation was used. 4 mg Versed, 50 mcg Fentanyl were administered intravenously. The patient's vital signs were monitored continuously by radiology nursing throughout the procedure.  Sedation Time: 75 minutes  FLUOROSCOPY TIME:  27 min 24  seconds  CONTRAST:  30mL OMNIPAQUE IOHEXOL 300 MG/ML  SOLN  TECHNIQUE: Informed consent was obtained from the patient following explanation of the procedure, risks, benefits and alternatives. The patient understands, agrees and consents for the procedure. All questions were addressed. A time out was performed.  Maximal barrier sterile technique utilized including caps, mask, sterile gowns, sterile gloves, large sterile drape, hand hygiene, and Betadine skin prep.  The right groin was interrogated with ultrasound. The common femoral artery was found to be widely patent. Local anesthesia was attained by infiltration with 1% lidocaine. A small dermatotomy was made. Under real-time sonographic guidance, the common femoral artery was punctured with a 21 gauge micropuncture needle. Image was obtained and stored for the medical record. Using a transitional 5 French micro sheath, a Bentson wire was advanced of the abdominal aorta. A C2 Cobra catheter was advanced to the aorta. The celiac artery was selected and an arteriogram attempted. However, the C2 catheter achieved insufficient purchase. Therefore, the C2 catheter was exchanged for a Mickelson catheter. The Mickelson catheter was secured is slightly more secure early and celiac artery origin. The celiac arteriogram was performed. There is is relatively conventional hepatic arterial anatomy. The gastroduodenal artery is relatively identified.  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 A Renegade ST micro catheter was then navigated into the gastroduodenal artery. Gastroduodenal arteriogram was performed. No definite active hemorrhage is identified. There is a supraduodenal branch off the proximal GDA. This was catheterized and an arteriogram performed. Again, no active arterial bleeding. However, the vessels are in the  immediate vicinity of the  endoscopically placed clips. The decision was made to proceed with coil embolization of the supraduodenal arterial branch and gastroduodenal artery to decrease the pressure head and reduce the risk and severity of recurrent bleeding. The supraduodenal branch was coil embolized using first a 2 x 4 x 41 mm interlock diamond coil followed by a 3 x 120 mm interlock coil. The micro catheter was then advanced into the gastroduodenal artery at its junction with the right gastric artery. The vessel was then coil embolized using first a 6 x 100 mm interlock coil followed by a single 5 x 80 mm coil and two 5 x 150 mm coils. The vessel was embolized nearly to the origin. The micro catheter was pulled back into the common hepatic artery and a post embolization arteriogram was performed confirming no further flow into the GDA or supraduodenal branch vessels.  The micro catheter was removed. The Mickelson catheter was navigated in this SMA. A superior mesenteric arteriogram was performed. No definite vascularity in the region of the clips. No retrograde filling of the coil packs. The catheter was removed.  Through the access sheath, a limited right common femoral arteriogram was performed. The access is within the common femoral artery above the bifurcation below the inferior genu of the inferior epigastric artery. The groin was re-prepped with chlorhexidine. 2 g Ancef was administered intravenously. Arterial hemostasis was achieved with the assistance of a Mynx Ace arterial closure device.  IMPRESSION: 1. Successful coil embolization of the gastroduodenal artery and proximal supraduodenal branch in the region of the endoscopically placed clips. 2. No definite active arterial hemorrhage was identified during the angiogram. 3. The patient received an additional unit of blood in interventional radiology in addition to the 1 unit received in endoscopy for a total of 2 units during his procedural course this afternoon. 4. By the end  the procedure, the patient had attained hemodynamic stability.  Signed,  Criselda Peaches, MD  [facility]  [facility]   Electronically Signed   By: Jacqulynn Cadet M.D.   On: 10/27/2013 17:07   Dg Chest Port 1 View  10/27/2013   CLINICAL DATA:  Duodenal ulcer coiling. Postprocedure. Asbestos exposure. A risk for aspiration.  EXAM: PORTABLE CHEST - 1 VIEW  COMPARISON:  10/26/2013  FINDINGS: Artifact degradation over the lung apices. Midline trachea. Moderate cardiomegaly. Left greater than right calcified pleural plaques. No pneumothorax. Mild volume loss at the medial left lung base. Diffuse peribronchial thickening.  IMPRESSION: Mild opacity at the medial left lung base.  Favor atelectasis.  Cardiomegaly without congestive failure.  Asbestos related pleural disease.   Electronically Signed   By: Abigail Miyamoto M.D.   On: 10/27/2013 19:10   [place] Guide Roadmapping  10/27/2013   CLINICAL DATA:  80 year old male with acute anemia secondary to active upper GI bleed. On upper endoscopy he was found have not actively bleeding duodenum ulcer. A clip was placed but this was insufficient to control the hemorrhage. The patient was taken urgently to interventional Radiology for angiogram and embolization.  EXAM: SELECTIVE VISCERAL ARTERIOGRAPHY; ARTERIOGRAPHY; IR EMBO ART VEN HEMORR LYMPH EXTRAV INC GUIDE ROADMAPPING; IR ULTRASOUND GUIDANCE VASC ACCESS RIGHT  Date: 10/27/2013  PROCEDURE: 1. Ultrasound-guided puncture of the right common femoral artery 2. Catheterization of celiac artery with arteriogram 3. Catheterization of the common hepatic artery with arteriogram 4. Catheterization of the gastroduodenal artery with arteriogram 5. Catheterization of  a super duodenum branch of the gastroduodenal artery with arteriogram 6. Coil embolization of the supra duodenal branch 7. Coil embolization of the gastroduodenal artery 8.  Catheterization of the superior mesenteric artery with arteriogram 9. Limited right common femoral arteriogram 10. Application of a Mynx percutaneous arterial closure device Interventional Radiologist:  Criselda Peaches, MD  ANESTHESIA/SEDATION: Moderate (conscious) sedation was used. 4 mg Versed, 50 mcg Fentanyl were administered intravenously. The patient's vital signs were monitored continuously by radiology nursing throughout the procedure.  Sedation Time: 75 minutes  FLUOROSCOPY TIME:  27 min 24 seconds  CONTRAST:  58mL OMNIPAQUE IOHEXOL 300 MG/ML  SOLN  TECHNIQUE: Informed consent was obtained from the patient following explanation of the procedure, risks, benefits and alternatives. The patient understands, agrees and consents for the procedure. All questions were addressed. A time out was performed.  Maximal barrier sterile technique utilized including caps, mask, sterile gowns, sterile gloves, large sterile drape, hand hygiene, and Betadine skin prep.  The right groin was interrogated with ultrasound. The common femoral artery was found to be widely patent. Local anesthesia was attained by infiltration with 1% lidocaine. A small dermatotomy was made. Under real-time sonographic guidance, the common femoral artery was punctured with a 21 gauge micropuncture needle. Image was obtained and stored for the medical record. Using a transitional 5 French micro sheath, a Bentson wire was advanced of the abdominal aorta. A C2 Cobra catheter was advanced to the aorta. The celiac artery was selected and an arteriogram attempted. However, the C2 catheter achieved insufficient purchase. Therefore, the C2 catheter was exchanged for a Mickelson catheter. The Mickelson catheter was secured is slightly more secure early and celiac artery origin. The celiac arteriogram was performed. There is is relatively conventional hepatic arterial anatomy. The gastroduodenal artery is relatively identified.  Attempts were made to  advance the 5 French catheter into the common hepatic artery, however this was unsuccessful secondary to stenosis of the celiac artery. A Renegade ST micro catheter was then navigated into the gastroduodenal artery. Gastroduodenal arteriogram was performed. No definite active hemorrhage is identified. There is a supraduodenal branch off the proximal GDA. This was catheterized and an arteriogram performed. Again, no active arterial bleeding. However, the vessels are in the immediate vicinity of the endoscopically placed clips. The decision was made to proceed with coil embolization of the supraduodenal arterial branch and gastroduodenal artery to decrease the pressure head and reduce the risk and severity of recurrent bleeding. The supraduodenal branch was coil embolized using first a 2 x 4 x 41 mm interlock diamond coil followed by a 3 x 120 mm interlock coil. The micro catheter was then advanced into the gastroduodenal artery at its junction with the right gastric artery. The vessel was then coil embolized using first a 6 x 100 mm interlock coil followed by a single 5 x 80 mm coil and two 5 x 150 mm coils. The vessel was embolized nearly to the origin. The micro catheter was pulled back into the common hepatic artery and a post embolization arteriogram was performed confirming no further flow into the GDA or supraduodenal branch vessels.  The micro catheter was removed. The Mickelson catheter was navigated in this SMA. A superior mesenteric arteriogram was performed. No definite vascularity in the region of the clips. No retrograde filling of the coil packs. The catheter was removed.  Through the access sheath, a limited right common femoral arteriogram was performed. The access is within the common femoral artery above the bifurcation below  the inferior genu of the inferior epigastric artery. The groin was re-prepped with chlorhexidine. 2 g Ancef was administered intravenously. Arterial hemostasis was achieved  with the assistance of a Mynx Ace arterial closure device.  IMPRESSION: 1. Successful coil embolization of the gastroduodenal artery and proximal supraduodenal branch in the region of the endoscopically placed clips. 2. No definite active arterial hemorrhage was identified during the angiogram. 3. The patient received an additional unit of blood in interventional radiology in addition to the 1 unit received in endoscopy for a total of 2 units during his procedural course this afternoon. 4. By the end the procedure, the patient had attained hemodynamic stability.  Signed,  Criselda Peaches, MD  [facility]  [facility]   Electronically Signed   By: Jacqulynn Cadet M.D.   On: 10/27/2013 17:07    Anti-infectives: Anti-infectives   Start     Dose/Rate Route Frequency Ordered Stop   10/27/13 1615  ceFAZolin (ANCEF) IVPB 2 g/50 mL premix     2 g 100 mL/hr over 30 Minutes Intravenous  Once 10/27/13 1606 10/27/13 1637      Assessment/Plan: s/p mesenteric arteriogram and coil embolization of GDA 4/18 secondary to duodenal ulcer bleed; current hgb 6.7-transfuse as needed; hydrate; monitor labs closely; other plans as per GI/CCM  LOS: 2 days    D Omar [name] 10/28/2013

## 2013-10-28 NOTE — Progress Notes (Signed)
Agree with PA note.  H&H slowly trending down today although pt remains hemodynamically stable.  Has had persistent melena, unclear if this is residual blood from yesterday or persistent bleeding.   Recommend transfusion and continue to monitor H&H.  Signed,  Criselda Peaches, MD [facility]

## 2013-10-28 NOTE — Progress Notes (Signed)
Dr. Redmond Pulling made aware hgb trending down from 7.9 to 7.2 with 3 bloody stools. Lab notified for CBC as soon as possible. Vitals stable through the night, will continue to monitor.  Desmond Dike RN

## 2013-10-28 NOTE — Progress Notes (Signed)
Subjective: No acute events.  He reports feeling well.  Objective: Vital signs in last 24 hours: Temp:  [97.5 F (36.4 C)-98.4 F (36.9 C)] 98.2 F (36.8 C) (04/19 0416) Pulse Rate:  [62-126] 95 (04/19 0700) Resp:  [8-32] 20 (04/19 0700) BP: (84-196)/(28-141) 116/52 mmHg (04/19 0700) SpO2:  [87 %-100 %] 100 % (04/19 0700) Last BM Date: 10/28/13  Intake/Output from previous day: 04/18 0701 - 04/19 0700 In: 1116.3 [I.V.:1116.3] Out: 475 [Urine:475] Intake/Output this shift:    General appearance: alert, no distress and very hard of hearing GI: soft, non-tender; bowel sounds normal; no masses,  no organomegaly  Lab Results:  Recent Labs  10/27/13 1817 10/27/13 2222 10/28/13 0255  WBC 12.8* 14.9* 14.0*  HGB 7.9* 7.5* 7.2*  HCT 22.3* 21.5* 20.3*  PLT 127* 127* 131*   BMET  Recent Labs  10/26/13 0900 10/27/13 0910 10/27/13 2221  NA 140 142 144  K 5.2 4.5 4.1  CL 106 112 115*  CO2 22 20 20   GLUCOSE 181* 169* 161*  BUN 71* 70* 65*  CREATININE 0.88 0.84 0.83  CALCIUM 8.2* 7.2* 7.7*   LFT  Recent Labs  10/27/13 0910  PROT 3.6*  ALBUMIN 1.6*  AST 10  ALT <5  ALKPHOS 43  BILITOT 0.6   PT/INR  Recent Labs  10/26/13 2130  LABPROT 14.8  INR 1.19   Hepatitis Panel No results found for this basename: HEPBSAG, HCVAB, HEPAIGM, HEPBIGM,  in the last 72 hours C-Diff No results found for this basename: CDIFFTOX,  in the last 72 hours Fecal Lactopherrin No results found for this basename: FECLLACTOFRN,  in the last 72 hours  Studies/Results: Ct Abdomen Pelvis Wo Contrast  10/27/2013   CLINICAL DATA:  Low hemoglobin.  Question intra-abdominal bleed.  EXAM: CT ABDOMEN AND PELVIS WITHOUT CONTRAST  TECHNIQUE: Multidetector CT imaging of the abdomen and pelvis was performed following the standard protocol without intravenous contrast.  COMPARISON:  CT ABD/PELVIS W CM dated 04/24/2013  FINDINGS: Numerous bilateral calcified pleural plaques. Mild cardiomegaly. No  confluent airspace opacities or effusions.  Small layering gallstones within the gallbladder. Liver, pancreas, spleen, adrenals and kidneys have an unremarkable unenhanced appearance. IVC filter is noted in the infrarenal IVC. Aortic and iliac calcifications without aneurysm.  There is prostate enlargement. There is beam hardening artifact from a left hip replacement which obscures lower pelvic structures. Previously seen large posterior bladder wall diverticulum is partially obscured by the beam hardening artifact. 9 mm calcification in the left side of the pelvis was shown on prior CT to be within this large diverticulum.  No evidence of retroperitoneal hematoma. No free fluid, free air or adenopathy. Stomach, large and small bowel are grossly unremarkable.  Diffuse degenerative changes throughout the thoracolumbar spine. No acute bony abnormality.  IMPRESSION: No retroperitoneal or intra-abdominal hemorrhage.  Cholelithiasis.  Markedly enlarged prostate. Large posterior bladder wall diverticulum noted on prior CT partially obscured by left hip replacement.  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   Electronically Signed   By: Rolm Baptise M.D.   On: 10/27/2013 01:30   Dg Chest 2 View  10/26/2013   CLINICAL DATA:  Weakness  EXAM: CHEST  2 VIEW  COMPARISON:  DG CHEST 1 VIEW dated 04/30/2013  FINDINGS: Calcified pleural plaques are reidentified. Healed left clavicular fracture. Heart size is mildly enlarged without evidence for edema. No new pulmonary parenchymal opacity. No pleural effusion.  IMPRESSION: No new acute cardiopulmonary process.  Evidence of  asbestos exposure reidentified.   Electronically Signed   By: Conchita Paris M.D.   On: 10/26/2013 09:36   Ir Angiogram Visceral Selective  10/27/2013   CLINICAL DATA:  80 year old male with acute anemia secondary to active upper GI bleed. On upper endoscopy he was found have not actively bleeding  duodenum ulcer. A clip was placed but this was insufficient to control the hemorrhage. The patient was taken urgently to interventional Radiology for angiogram and embolization.  EXAM: SELECTIVE VISCERAL ARTERIOGRAPHY; ARTERIOGRAPHY; IR EMBO ART VEN HEMORR LYMPH EXTRAV INC GUIDE ROADMAPPING; IR ULTRASOUND GUIDANCE VASC ACCESS RIGHT  Date: 10/27/2013  PROCEDURE: 1. Ultrasound-guided puncture of the right common femoral artery 2. Catheterization of celiac artery with arteriogram 3. Catheterization of the common hepatic artery with arteriogram 4. Catheterization of the gastroduodenal artery with arteriogram 5. Catheterization of a super duodenum branch of the gastroduodenal artery with arteriogram 6. Coil embolization of the supra duodenal branch 7. Coil embolization of the gastroduodenal artery 8. Catheterization of the superior mesenteric artery with arteriogram 9. Limited right common femoral arteriogram 10. Application of a Mynx percutaneous arterial closure device Interventional Radiologist:  Criselda Peaches, MD  ANESTHESIA/SEDATION: Moderate (conscious) sedation was used. 4 mg Versed, 50 mcg Fentanyl were administered intravenously. The patient's vital signs were monitored continuously by radiology nursing throughout the procedure.  Sedation Time: 75 minutes  FLUOROSCOPY TIME:  27 min 24 seconds  CONTRAST:  56mL OMNIPAQUE IOHEXOL 300 MG/ML  SOLN  TECHNIQUE: Informed consent was obtained from the patient following explanation of the procedure, risks, benefits and alternatives. The patient understands, agrees and consents for the procedure. All questions were addressed. A time out was performed.  Maximal barrier sterile technique utilized including caps, mask, sterile gowns, sterile gloves, large sterile drape, hand hygiene, and Betadine skin prep.  The right groin was interrogated with ultrasound. The common femoral artery was found to be widely patent. Local anesthesia was attained by infiltration with 1%  lidocaine. A small dermatotomy was made. Under real-time sonographic guidance, the common femoral artery was punctured with a 21 gauge micropuncture needle. Image was obtained and stored for the medical record. Using a transitional 5 French micro sheath, a Bentson wire was advanced of the abdominal aorta. A C2 Cobra catheter was advanced to the aorta. The celiac artery was selected and an arteriogram attempted. However, the C2 catheter achieved insufficient purchase. Therefore, the C2 catheter was exchanged for a Mickelson catheter. The Mickelson catheter was secured is slightly more secure early and celiac artery origin. The celiac arteriogram was performed. There is is relatively conventional hepatic arterial anatomy. The gastroduodenal artery is relatively identified.  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 A Renegade ST micro catheter was then navigated into the gastroduodenal artery. Gastroduodenal arteriogram was performed. No definite active hemorrhage is identified. There is a supraduodenal branch off the proximal GDA. This was catheterized and an arteriogram performed. Again, no active arterial bleeding. However, the vessels are in the immediate vicinity of the endoscopically placed clips. The decision was made to proceed with coil embolization of the supraduodenal arterial branch and gastroduodenal artery to decrease the pressure head and reduce the risk and severity of recurrent bleeding. The supraduodenal branch was coil embolized using first a 2 x 4 x 41 mm interlock diamond coil followed by a 3 x 120 mm interlock coil. The micro catheter was then advanced into the gastroduodenal  artery at its junction with the right gastric artery. The vessel was then coil embolized using first a 6 x 100 mm interlock coil followed by a single 5 x 80 mm coil and two 5 x 150 mm coils. The vessel was  embolized nearly to the origin. The micro catheter was pulled back into the common hepatic artery and a post embolization arteriogram was performed confirming no further flow into the GDA or supraduodenal branch vessels.  The micro catheter was removed. The Mickelson catheter was navigated in this SMA. A superior mesenteric arteriogram was performed. No definite vascularity in the region of the clips. No retrograde filling of the coil packs. The catheter was removed.  Through the access sheath, a limited right common femoral arteriogram was performed. The access is within the common femoral artery above the bifurcation below the inferior genu of the inferior epigastric artery. The groin was re-prepped with chlorhexidine. 2 g Ancef was administered intravenously. Arterial hemostasis was achieved with the assistance of a Mynx Ace arterial closure device.  IMPRESSION: 1. Successful coil embolization of the gastroduodenal artery and proximal supraduodenal branch in the region of the endoscopically placed clips. 2. No definite active arterial hemorrhage was identified during the angiogram. 3. The patient received an additional unit of blood in interventional radiology in addition to the 1 unit received in endoscopy for a total of 2 units during his procedural course this afternoon. 4. By the end the procedure, the patient had attained hemodynamic stability.  Signed,  Criselda Peaches, MD  [facility]  [facility]   Electronically Signed   By: Jacqulynn Cadet M.D.   On: 10/27/2013 17:07   Ir Angiogram Visceral Selective  10/27/2013   CLINICAL DATA:  80 year old male with acute anemia secondary to active upper GI bleed. On upper endoscopy he was found have not actively bleeding duodenum ulcer. A clip was placed but this was insufficient to control the hemorrhage. The patient was taken urgently to interventional Radiology for angiogram and embolization.  EXAM: SELECTIVE  VISCERAL ARTERIOGRAPHY; ARTERIOGRAPHY; IR EMBO ART VEN HEMORR LYMPH EXTRAV INC GUIDE ROADMAPPING; IR ULTRASOUND GUIDANCE VASC ACCESS RIGHT  Date: 10/27/2013  PROCEDURE: 1. Ultrasound-guided puncture of the right common femoral artery 2. Catheterization of celiac artery with arteriogram 3. Catheterization of the common hepatic artery with arteriogram 4. Catheterization of the gastroduodenal artery with arteriogram 5. Catheterization of a super duodenum branch of the gastroduodenal artery with arteriogram 6. Coil embolization of the supra duodenal branch 7. Coil embolization of the gastroduodenal artery 8. Catheterization of the superior mesenteric artery with arteriogram 9. Limited right common femoral arteriogram 10. Application of a Mynx percutaneous arterial closure device Interventional Radiologist:  Criselda Peaches, MD  ANESTHESIA/SEDATION: Moderate (conscious) sedation was used. 4 mg Versed, 50 mcg Fentanyl were administered intravenously. The patient's vital signs were monitored continuously by radiology nursing throughout the procedure.  Sedation Time: 75 minutes  FLUOROSCOPY TIME:  27 min 24 seconds  CONTRAST:  47mL OMNIPAQUE IOHEXOL 300 MG/ML  SOLN  TECHNIQUE: Informed consent was obtained from the patient following explanation of the procedure, risks, benefits and alternatives. The patient understands, agrees and consents for the procedure. All questions were addressed. A time out was performed.  Maximal barrier sterile technique utilized including caps, mask, sterile gowns, sterile gloves, large sterile drape, hand hygiene, and Betadine skin prep.  The right groin was interrogated with ultrasound. The common femoral artery was found to be widely patent. Local anesthesia was attained by  infiltration with 1% lidocaine. A small dermatotomy was made. Under real-time sonographic guidance, the common femoral artery was punctured with a 21 gauge micropuncture needle. Image was obtained and stored for the  medical record. Using a transitional 5 French micro sheath, a Bentson wire was advanced of the abdominal aorta. A C2 Cobra catheter was advanced to the aorta. The celiac artery was selected and an arteriogram attempted. However, the C2 catheter achieved insufficient purchase. Therefore, the C2 catheter was exchanged for a Mickelson catheter. The Mickelson catheter was secured is slightly more secure early and celiac artery origin. The celiac arteriogram was performed. There is is relatively conventional hepatic arterial anatomy. The gastroduodenal artery is relatively identified.  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 A Renegade ST micro catheter was then navigated into the gastroduodenal artery. Gastroduodenal arteriogram was performed. No definite active hemorrhage is identified. There is a supraduodenal branch off the proximal GDA. This was catheterized and an arteriogram performed. Again, no active arterial bleeding. However, the vessels are in the immediate vicinity of the endoscopically placed clips. The decision was made to proceed with coil embolization of the supraduodenal arterial branch and gastroduodenal artery to decrease the pressure head and reduce the risk and severity of recurrent bleeding. The supraduodenal branch was coil embolized using first a 2 x 4 x 41 mm interlock diamond coil followed by a 3 x 120 mm interlock coil. The micro catheter was then advanced into the gastroduodenal artery at its junction with the right gastric artery. The vessel was then coil embolized using first a 6 x 100 mm interlock coil followed by a single 5 x 80 mm coil and two 5 x 150 mm coils. The vessel was embolized nearly to the origin. The micro catheter was pulled back into the common hepatic artery and a post embolization arteriogram was performed confirming no further flow into the GDA or  supraduodenal branch vessels.  The micro catheter was removed. The Mickelson catheter was navigated in this SMA. A superior mesenteric arteriogram was performed. No definite vascularity in the region of the clips. No retrograde filling of the coil packs. The catheter was removed.  Through the access sheath, a limited right common femoral arteriogram was performed. The access is within the common femoral artery above the bifurcation below the inferior genu of the inferior epigastric artery. The groin was re-prepped with chlorhexidine. 2 g Ancef was administered intravenously. Arterial hemostasis was achieved with the assistance of a Mynx Ace arterial closure device.  IMPRESSION: 1. Successful coil embolization of the gastroduodenal artery and proximal supraduodenal branch in the region of the endoscopically placed clips. 2. No definite active arterial hemorrhage was identified during the angiogram. 3. The patient received an additional unit of blood in interventional radiology in addition to the 1 unit received in endoscopy for a total of 2 units during his procedural course this afternoon. 4. By the end the procedure, the patient had attained hemodynamic stability.  Signed,  Criselda Peaches, MD  [facility]  [facility] Radiology   Electronically Signed   By: Jacqulynn Cadet M.D.   On: 10/27/2013 17:07   Ir Angiogram Follow Up Study  10/27/2013   CLINICAL DATA:  80 year old male with acute anemia secondary to active upper GI bleed. On upper endoscopy he was found have not actively bleeding duodenum ulcer. A clip was placed but this was insufficient  to control the hemorrhage. The patient was taken urgently to interventional Radiology for angiogram and embolization.  EXAM: SELECTIVE VISCERAL ARTERIOGRAPHY; ARTERIOGRAPHY; IR EMBO ART VEN HEMORR LYMPH EXTRAV INC GUIDE ROADMAPPING; IR ULTRASOUND GUIDANCE VASC ACCESS RIGHT  Date: 10/27/2013  PROCEDURE: 1. Ultrasound-guided  puncture of the right common femoral artery 2. Catheterization of celiac artery with arteriogram 3. Catheterization of the common hepatic artery with arteriogram 4. Catheterization of the gastroduodenal artery with arteriogram 5. Catheterization of a super duodenum branch of the gastroduodenal artery with arteriogram 6. Coil embolization of the supra duodenal branch 7. Coil embolization of the gastroduodenal artery 8. Catheterization of the superior mesenteric artery with arteriogram 9. Limited right common femoral arteriogram 10. Application of a Mynx percutaneous arterial closure device Interventional Radiologist:  Criselda Peaches, MD  ANESTHESIA/SEDATION: Moderate (conscious) sedation was used. 4 mg Versed, 50 mcg Fentanyl were administered intravenously. The patient's vital signs were monitored continuously by radiology nursing throughout the procedure.  Sedation Time: 75 minutes  FLUOROSCOPY TIME:  27 min 24 seconds  CONTRAST:  26mL OMNIPAQUE IOHEXOL 300 MG/ML  SOLN  TECHNIQUE: Informed consent was obtained from the patient following explanation of the procedure, risks, benefits and alternatives. The patient understands, agrees and consents for the procedure. All questions were addressed. A time out was performed.  Maximal barrier sterile technique utilized including caps, mask, sterile gowns, sterile gloves, large sterile drape, hand hygiene, and Betadine skin prep.  The right groin was interrogated with ultrasound. The common femoral artery was found to be widely patent. Local anesthesia was attained by infiltration with 1% lidocaine. A small dermatotomy was made. Under real-time sonographic guidance, the common femoral artery was punctured with a 21 gauge micropuncture needle. Image was obtained and stored for the medical record. Using a transitional 5 French micro sheath, a Bentson wire was advanced of the abdominal aorta. A C2 Cobra catheter was advanced to the aorta. The celiac artery was selected and  an arteriogram attempted. However, the C2 catheter achieved insufficient purchase. Therefore, the C2 catheter was exchanged for a Mickelson catheter. The Mickelson catheter was secured is slightly more secure early and celiac artery origin. The celiac arteriogram was performed. There is is relatively conventional hepatic arterial anatomy. The gastroduodenal artery is relatively identified.  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 A Renegade ST micro catheter was then navigated into the gastroduodenal artery. Gastroduodenal arteriogram was performed. No definite active hemorrhage is identified. There is a supraduodenal branch off the proximal GDA. This was catheterized and an arteriogram performed. Again, no active arterial bleeding. However, the vessels are in the immediate vicinity of the endoscopically placed clips. The decision was made to proceed with coil embolization of the supraduodenal arterial branch and gastroduodenal artery to decrease the pressure head and reduce the risk and severity of recurrent bleeding. The supraduodenal branch was coil embolized using first a 2 x 4 x 41 mm interlock diamond coil followed by a 3 x 120 mm interlock coil. The micro catheter was then advanced into the gastroduodenal artery at its junction with the right gastric artery. The vessel was then coil embolized using first a 6 x 100 mm interlock coil followed by a single 5 x 80 mm coil and two 5 x 150 mm coils. The vessel was embolized nearly to the origin. The micro catheter was pulled back into the common hepatic artery and a post embolization  arteriogram was performed confirming no further flow into the GDA or supraduodenal branch vessels.  The micro catheter was removed. The Mickelson catheter was navigated in this SMA. A superior mesenteric arteriogram was performed. No definite vascularity in the region of  the clips. No retrograde filling of the coil packs. The catheter was removed.  Through the access sheath, a limited right common femoral arteriogram was performed. The access is within the common femoral artery above the bifurcation below the inferior genu of the inferior epigastric artery. The groin was re-prepped with chlorhexidine. 2 g Ancef was administered intravenously. Arterial hemostasis was achieved with the assistance of a Mynx Ace arterial closure device.  IMPRESSION: 1. Successful coil embolization of the gastroduodenal artery and proximal supraduodenal branch in the region of the endoscopically placed clips. 2. No definite active arterial hemorrhage was identified during the angiogram. 3. The patient received an additional unit of blood in interventional radiology in addition to the 1 unit received in endoscopy for a total of 2 units during his procedural course this afternoon. 4. By the end the procedure, the patient had attained hemodynamic stability.  Signed,  Criselda Peaches, MD  [facility]  [facility] Radiology   Electronically Signed   By: Jacqulynn Cadet M.D.   On: 10/27/2013 17:07   Ir US Guide Vasc Access Right  10/27/2013   CLINICAL DATA:  80 year old male with acute anemia secondary to active upper GI bleed. On upper endoscopy he was found have not actively bleeding duodenum ulcer. A clip was placed but this was insufficient to control the hemorrhage. The patient was taken urgently to interventional Radiology for angiogram and embolization.  EXAM: SELECTIVE VISCERAL ARTERIOGRAPHY; ARTERIOGRAPHY; IR EMBO ART VEN HEMORR LYMPH EXTRAV INC GUIDE ROADMAPPING; IR ULTRASOUND GUIDANCE VASC ACCESS RIGHT  Date: 10/27/2013  PROCEDURE: 1. Ultrasound-guided puncture of the right common femoral artery 2. Catheterization of celiac artery with arteriogram 3. Catheterization of the common hepatic artery with arteriogram 4. Catheterization of the gastroduodenal  artery with arteriogram 5. Catheterization of a super duodenum branch of the gastroduodenal artery with arteriogram 6. Coil embolization of the supra duodenal branch 7. Coil embolization of the gastroduodenal artery 8. Catheterization of the superior mesenteric artery with arteriogram 9. Limited right common femoral arteriogram 10. Application of a Mynx percutaneous arterial closure device Interventional Radiologist:  Criselda Peaches, MD  ANESTHESIA/SEDATION: Moderate (conscious) sedation was used. 4 mg Versed, 50 mcg Fentanyl were administered intravenously. The patient's vital signs were monitored continuously by radiology nursing throughout the procedure.  Sedation Time: 75 minutes  FLUOROSCOPY TIME:  27 min 24 seconds  CONTRAST:  39mL OMNIPAQUE IOHEXOL 300 MG/ML  SOLN  TECHNIQUE: Informed consent was obtained from the patient following explanation of the procedure, risks, benefits and alternatives. The patient understands, agrees and consents for the procedure. All questions were addressed. A time out was performed.  Maximal barrier sterile technique utilized including caps, mask, sterile gowns, sterile gloves, large sterile drape, hand hygiene, and Betadine skin prep.  The right groin was interrogated with ultrasound. The common femoral artery was found to be widely patent. Local anesthesia was attained by infiltration with 1% lidocaine. A small dermatotomy was made. Under real-time sonographic guidance, the common femoral artery was punctured with a 21 gauge micropuncture needle. Image was obtained and stored for the medical record. Using a transitional 5 French micro sheath, a Bentson wire was advanced of the abdominal aorta. A C2 Cobra catheter was advanced to the aorta. The celiac  artery was selected and an arteriogram attempted. However, the C2 catheter achieved insufficient purchase. Therefore, the C2 catheter was exchanged for a Mickelson catheter. The Mickelson catheter was secured is slightly more  secure early and celiac artery origin. The celiac arteriogram was performed. There is is relatively conventional hepatic arterial anatomy. The gastroduodenal artery is relatively identified.  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 A Renegade ST micro catheter was then navigated into the gastroduodenal artery. Gastroduodenal arteriogram was performed. No definite active hemorrhage is identified. There is a supraduodenal branch off the proximal GDA. This was catheterized and an arteriogram performed. Again, no active arterial bleeding. However, the vessels are in the immediate vicinity of the endoscopically placed clips. The decision was made to proceed with coil embolization of the supraduodenal arterial branch and gastroduodenal artery to decrease the pressure head and reduce the risk and severity of recurrent bleeding. The supraduodenal branch was coil embolized using first a 2 x 4 x 41 mm interlock diamond coil followed by a 3 x 120 mm interlock coil. The micro catheter was then advanced into the gastroduodenal artery at its junction with the right gastric artery. The vessel was then coil embolized using first a 6 x 100 mm interlock coil followed by a single 5 x 80 mm coil and two 5 x 150 mm coils. The vessel was embolized nearly to the origin. The micro catheter was pulled back into the common hepatic artery and a post embolization arteriogram was performed confirming no further flow into the GDA or supraduodenal branch vessels.  The micro catheter was removed. The Mickelson catheter was navigated in this SMA. A superior mesenteric arteriogram was performed. No definite vascularity in the region of the clips. No retrograde filling of the coil packs. The catheter was removed.  Through the access sheath, a limited right common femoral arteriogram was performed. The access is within the common  femoral artery above the bifurcation below the inferior genu of the inferior epigastric artery. The groin was re-prepped with chlorhexidine. 2 g Ancef was administered intravenously. Arterial hemostasis was achieved with the assistance of a Mynx Ace arterial closure device.  IMPRESSION: 1. Successful coil embolization of the gastroduodenal artery and proximal supraduodenal branch in the region of the endoscopically placed clips. 2. No definite active arterial hemorrhage was identified during the angiogram. 3. The patient received an additional unit of blood in interventional radiology in addition to the 1 unit received in endoscopy for a total of 2 units during his procedural course this afternoon. 4. By the end the procedure, the patient had attained hemodynamic stability.  Signed,  Criselda Peaches, MD  [facility]  [facility]   Electronically Signed   By: Jacqulynn Cadet M.D.   On: 10/27/2013 17:07   Dg Chest Port 1 View  10/27/2013   CLINICAL DATA:  Duodenal ulcer coiling. Postprocedure. Asbestos exposure. A risk for aspiration.  EXAM: PORTABLE CHEST - 1 VIEW  COMPARISON:  10/26/2013  FINDINGS: Artifact degradation over the lung apices. Midline trachea. Moderate cardiomegaly. Left greater than right calcified pleural plaques. No pneumothorax. Mild volume loss at the medial left lung base. Diffuse peribronchial thickening.  IMPRESSION: Mild opacity at the medial left lung base.  Favor atelectasis.  Cardiomegaly without congestive failure.  Asbestos related pleural disease.   Electronically Signed   By: Abigail Miyamoto M.D.   On: 10/27/2013  19:10   [place] Guide Roadmapping  10/27/2013   CLINICAL DATA:  80 year old male with acute anemia secondary to active upper GI bleed. On upper endoscopy he was found have not actively bleeding duodenum ulcer. A clip was placed but this was insufficient to control the hemorrhage. The patient  was taken urgently to interventional Radiology for angiogram and embolization.  EXAM: SELECTIVE VISCERAL ARTERIOGRAPHY; ARTERIOGRAPHY; IR EMBO ART VEN HEMORR LYMPH EXTRAV INC GUIDE ROADMAPPING; IR ULTRASOUND GUIDANCE VASC ACCESS RIGHT  Date: 10/27/2013  PROCEDURE: 1. Ultrasound-guided puncture of the right common femoral artery 2. Catheterization of celiac artery with arteriogram 3. Catheterization of the common hepatic artery with arteriogram 4. Catheterization of the gastroduodenal artery with arteriogram 5. Catheterization of a super duodenum branch of the gastroduodenal artery with arteriogram 6. Coil embolization of the supra duodenal branch 7. Coil embolization of the gastroduodenal artery 8. Catheterization of the superior mesenteric artery with arteriogram 9. Limited right common femoral arteriogram 10. Application of a Mynx percutaneous arterial closure device Interventional Radiologist:  Criselda Peaches, MD  ANESTHESIA/SEDATION: Moderate (conscious) sedation was used. 4 mg Versed, 50 mcg Fentanyl were administered intravenously. The patient's vital signs were monitored continuously by radiology nursing throughout the procedure.  Sedation Time: 75 minutes  FLUOROSCOPY TIME:  27 min 24 seconds  CONTRAST:  40mL OMNIPAQUE IOHEXOL 300 MG/ML  SOLN  TECHNIQUE: Informed consent was obtained from the patient following explanation of the procedure, risks, benefits and alternatives. The patient understands, agrees and consents for the procedure. All questions were addressed. A time out was performed.  Maximal barrier sterile technique utilized including caps, mask, sterile gowns, sterile gloves, large sterile drape, hand hygiene, and Betadine skin prep.  The right groin was interrogated with ultrasound. The common femoral artery was found to be widely patent. Local anesthesia was attained by infiltration with 1% lidocaine. A small dermatotomy was made. Under real-time sonographic guidance, the common femoral artery  was punctured with a 21 gauge micropuncture needle. Image was obtained and stored for the medical record. Using a transitional 5 French micro sheath, a Bentson wire was advanced of the abdominal aorta. A C2 Cobra catheter was advanced to the aorta. The celiac artery was selected and an arteriogram attempted. However, the C2 catheter achieved insufficient purchase. Therefore, the C2 catheter was exchanged for a Mickelson catheter. The Mickelson catheter was secured is slightly more secure early and celiac artery origin. The celiac arteriogram was performed. There is is relatively conventional hepatic arterial anatomy. The gastroduodenal artery is relatively identified.  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 A Renegade ST micro catheter was then navigated into the gastroduodenal artery. Gastroduodenal arteriogram was performed. No definite active hemorrhage is identified. There is a supraduodenal branch off the proximal GDA. This was catheterized and an arteriogram performed. Again, no active arterial bleeding. However, the vessels are in the immediate vicinity of the endoscopically placed clips. The decision was made to proceed with coil embolization of the supraduodenal arterial branch and gastroduodenal artery to decrease the pressure head and reduce the risk and severity of recurrent bleeding. The supraduodenal branch was coil embolized using first a 2 x 4 x 41 mm interlock diamond coil followed by a 3 x 120 mm interlock coil. The micro catheter was then advanced into the gastroduodenal artery at its junction with the right gastric artery. The  vessel was then coil embolized using first a 6 x 100 mm interlock coil followed by a single 5 x 80 mm coil and two 5 x 150 mm coils. The vessel was embolized nearly to the origin. The micro catheter was pulled back into the common hepatic artery and a post  embolization arteriogram was performed confirming no further flow into the GDA or supraduodenal branch vessels.  The micro catheter was removed. The Mickelson catheter was navigated in this SMA. A superior mesenteric arteriogram was performed. No definite vascularity in the region of the clips. No retrograde filling of the coil packs. The catheter was removed.  Through the access sheath, a limited right common femoral arteriogram was performed. The access is within the common femoral artery above the bifurcation below the inferior genu of the inferior epigastric artery. The groin was re-prepped with chlorhexidine. 2 g Ancef was administered intravenously. Arterial hemostasis was achieved with the assistance of a Mynx Ace arterial closure device.  IMPRESSION: 1. Successful coil embolization of the gastroduodenal artery and proximal supraduodenal branch in the region of the endoscopically placed clips. 2. No definite active arterial hemorrhage was identified during the angiogram. 3. The patient received an additional unit of blood in interventional radiology in addition to the 1 unit received in endoscopy for a total of 2 units during his procedural course this afternoon. 4. By the end the procedure, the patient had attained hemodynamic stability.  Signed,  Criselda Peaches, MD  [facility]  [facility]   Electronically Signed   By: Jacqulynn Cadet M.D.   On: 10/27/2013 17:07    Medications:  Scheduled: . antiseptic oral rinse  15 mL Mouth Rinse BID  . pantoprazole (PROTONIX) IV  40 mg Intravenous Q12H  . sodium chloride  500 mL Intravenous Once  . sodium chloride  3 mL Intravenous Q12H   Continuous: . sodium chloride 50 mL/hr at 10/27/13 1813    Assessment/Plan: 1) Severe duodenal ulcer bleed s/p coil embolization. 2) Anemia.   The patient's HGB and vitals are relatively stable.  Nursing reports passing some melena, but I suspect this is old blood.   His HGB has dropped a little, but with the severity of his bleed we are probably still a little behind with his HGB.  Dr. Katrinka Blazing intervention is greatly appreciated.  Plan: 1) Continue to follow HGB. 2) Continue with Protonix. 3) Start sucralfate.   LOS: 2 days   Beryle Beams 10/28/2013, 7:14 AM

## 2013-10-28 NOTE — Consult Note (Signed)
Reason for Consult: Hematuria, Bladder Cancer, Lower Urinary Tract Symptoms  Referring Physician: D. Tyson Alias MD  Omar [name] is an 80 y.o. male.   HPI:   1 - Bladder Cancer - s/p transurethral resection followed by local XRT 2014 to bladder dome for likely T2 or greater bladder  Cancer, as not cystectomy candidate and some concern over metastatic disease given some small pulmonary nodules by CT. Managed with surveillance by Dr. Brunilda Payor. CT and CXR this admission without any new evidence of progressive disease.   2 - Gross Hematuria - Occasional gross hematuria on / off x years. Family noted some around time of admission and concerned. No clots. Now resolved grossly. UA this admission w/o infectious parameters.   3 - Lower Urinary Tract Symptoms - Pt with good continence at baseline per family, does have h/o some obtsructive symptoms on / off adn known very large prostate. No voiding complaints at present.   Today Omar [name] is seen in consultation for above. He is currently admitted with large upper GI bleed that has been impressively and quickly managed by transfusion, endoscopic clipping and now selective angioembolization in coordinated multi-team effort.   Past Medical History  Diagnosis Date  . History of pulmonary embolus (PE)     APRIL 2011--  MULTIPLE PE'S AND DVT  . BPH (benign prostatic hypertrophy)   . History of urinary retention   . History of transurethral destruction of bladder lesion   . H/O asbestos exposure   . History of DVT (deep vein thrombosis)     APRIL 2011--  UPPER AND LOWER EXTREMITIES  . Bladder tumor     recurrent  . Nocturia   . Heart murmur, systolic     enlarged  . Bladder cancer 10/20/12    high grade papillary/invasive urothelial  . Hx of radiation therapy 01/02/13- 02/19/13    subtotal bladder 4500 cGy 25 sessions, boost 1600 cGy 8 sessions  . Skin cancer 03/2013    [name] cheek  . Asbestosis   . Fracture   . Hx of radiation therapy 01/02/13- 02/19/13     subtotal bladder 4500 cGy 25 sessions, reduced field boost 1600 cGy 8 sessions    Past Surgical History  Procedure Laterality Date  . Repair recurrent [name] inguinal hernia  06-01-1999    x 2  . Transurethral resection of prostate  1999   &  11-17-2000  . Cysto/ eua/ placement suprapubic tube  01-13-2000  . Transurethral resection of bladder tumor  06-30-2010  . Transurethral resection of prostate  07-31-2010    AND BLADDER RESECTION BLADDER TUMOR  . Vena cava filter placement  APRIL 2011    INFERIOR  . Transthoracic echocardiogram  10-11-2012   DR TILLEY    CONCENTRIC LVH WITH NORMAL LVSF/ EF 55%/ GRADE I DIASTOLIC DYSFUNCTION  . Transurethral resection of bladder tumor N/A 10/20/2012    Procedure: TRANSURETHRAL RESECTION OF BLADDER TUMOR (TURBT);  Surgeon: Lindaann Slough, MD;  Location: [facility];  Service: Urology;  Laterality: N/A;  . Cystoscopy N/A 10/20/2012    Procedure: CYSTOSCOPY;  Surgeon: Lindaann Slough, MD;  Location: [facility];  Service: Urology;  Laterality: N/A;  . Cataract surgery      BILAT. EYES  . Mohs surgery  03/2013    [name] cheek  . Hip arthroplasty [name] 05/01/2013    Procedure: ARTHROPLASTY BIPOLAR HIP;  Surgeon: Sheral Apley, MD;  Location: [facility] OR;  Service: Orthopedics;  Laterality: [name];    History reviewed.  No pertinent family history.  Social History:  reports that he has never smoked. He has never used smokeless tobacco. He reports that he does not drink alcohol or use illicit drugs.  Allergies:  Allergies  Allergen Reactions  . Benadryl [Diphenhydramine Hcl] Other (See Comments)    Urinary retention  . Latex Other (See Comments)    blisters    Medications: I have reviewed the patient's current medications.  Results for orders placed during the hospital encounter of 10/26/13 (from the past 48 hour(s))  TYPE AND SCREEN     Status: None   Collection Time    10/26/13 10:30 AM      Result Value Ref Range    ABO/RH(D) B POS     Antibody Screen NEG     Sample Expiration 10/29/2013     Unit Number [id number]     Blood Component Type RED CELLS,LR     Unit division 00     Status of Unit ISSUED,FINAL     Transfusion Status OK TO TRANSFUSE     Crossmatch Result Compatible     Unit Number [id number]     Blood Component Type RED CELLS,LR     Unit division 00     Status of Unit ISSUED,FINAL     Transfusion Status OK TO TRANSFUSE     Crossmatch Result Compatible     Unit Number [id number]     Blood Component Type RED CELLS,LR     Unit division 00     Status of Unit ISSUED     Transfusion Status OK TO TRANSFUSE     Crossmatch Result Compatible     Unit Number [id number]     Blood Component Type RED CELLS,LR     Unit division 00     Status of Unit ISSUED     Transfusion Status OK TO TRANSFUSE     Crossmatch Result Compatible     Unit Number [id number]     Blood Component Type RED CELLS,LR     Unit division 00     Status of Unit ISSUED     Transfusion Status OK TO TRANSFUSE     Crossmatch Result Compatible     Unit Number [id number]     Blood Component Type RED CELLS,LR     Unit division 00     Status of Unit ISSUED     Transfusion Status OK TO TRANSFUSE     Crossmatch Result Compatible    PREPARE RBC (CROSSMATCH)     Status: None   Collection Time    10/26/13 11:15 AM      Result Value Ref Range   Order Confirmation ORDER PROCESSED BY BLOOD BANK    URINALYSIS, ROUTINE W REFLEX MICROSCOPIC     Status: Abnormal   Collection Time    10/26/13  2:00 PM      Result Value Ref Range   Color, Urine YELLOW  YELLOW   APPearance CLOUDY (*) CLEAR   Specific Gravity, Urine 1.022  1.005 - 1.030   pH 5.0  5.0 - 8.0   Glucose, UA NEGATIVE  NEGATIVE mg/dL   Hgb urine dipstick LARGE (*) NEGATIVE   Bilirubin Urine NEGATIVE  NEGATIVE   Ketones, ur NEGATIVE  NEGATIVE mg/dL   Protein, ur 30 (*) NEGATIVE mg/dL   Urobilinogen, UA 0.2  0.0 - 1.0 mg/dL   Nitrite NEGATIVE  NEGATIVE    Leukocytes, UA LARGE (*) NEGATIVE  URINE CULTURE     Status: None   Collection  Time    10/26/13  2:00 PM      Result Value Ref Range   Specimen Description URINE, CLEAN CATCH     Special Requests NONE     Culture  Setup Time       Value: 10/26/2013 19:19     Performed at [facility]       Value: 95,000 COLONIES/ML     Performed at [facility]   Culture       Value: ENTEROCOCCUS SPECIES     Performed at [facility]   Report Status PENDING    URINE RAPID DRUG SCREEN (HOSP PERFORMED)     Status: None   Collection Time    10/26/13  2:00 PM      Result Value Ref Range   Opiates NONE DETECTED  NONE DETECTED   Cocaine NONE DETECTED  NONE DETECTED   Benzodiazepines NONE DETECTED  NONE DETECTED   Amphetamines NONE DETECTED  NONE DETECTED   Tetrahydrocannabinol NONE DETECTED  NONE DETECTED   Barbiturates NONE DETECTED  NONE DETECTED   Comment:            DRUG SCREEN FOR MEDICAL PURPOSES     ONLY.  IF CONFIRMATION IS NEEDED     FOR ANY PURPOSE, NOTIFY LAB     WITHIN 5 DAYS.                LOWEST DETECTABLE LIMITS     FOR URINE DRUG SCREEN     Drug Class       Cutoff (ng/mL)     Amphetamine      1000     Barbiturate      200     Benzodiazepine   017     Tricyclics       510     Opiates          300     Cocaine          300     THC              50  URINE MICROSCOPIC-ADD ON     Status: Abnormal   Collection Time    10/26/13  2:00 PM      Result Value Ref Range   Squamous Epithelial / LPF FEW (*) RARE   WBC, UA TOO NUMEROUS TO COUNT  <3 WBC/hpf   RBC / HPF TOO NUMEROUS TO COUNT  <3 RBC/hpf   Bacteria, UA MANY (*) RARE  GLUCOSE, CAPILLARY     Status: Abnormal   Collection Time    10/26/13  3:08 PM      Result Value Ref Range   Glucose-Capillary 145 (*) 70 - 99 mg/dL  MRSA PCR SCREENING     Status: None   Collection Time    10/26/13  3:20 PM      Result Value Ref Range   MRSA by PCR NEGATIVE  NEGATIVE   Comment:            The  GeneXpert MRSA Assay (FDA     approved for NASAL specimens     only), is one component of a     comprehensive MRSA colonization     surveillance program. It is not     intended to diagnose MRSA     infection nor to guide or     monitor treatment for     MRSA infections.  TROPONIN I  Status: None   Collection Time    10/26/13  9:30 PM      Result Value Ref Range   Troponin I <0.30  <0.30 ng/mL   Comment:            Due to the release kinetics of cTnI,     a negative result within the first hours     of the onset of symptoms does not rule out     myocardial infarction with certainty.     If myocardial infarction is still suspected,     repeat the test at appropriate intervals.  PROTIME-INR     Status: None   Collection Time    10/26/13  9:30 PM      Result Value Ref Range   Prothrombin Time 14.8  11.6 - 15.2 seconds   INR 1.19  0.00 - 1.49  CBC     Status: Abnormal   Collection Time    10/26/13  9:30 PM      Result Value Ref Range   WBC 7.9  4.0 - 10.5 K/uL   RBC 2.12 (*) 4.22 - 5.81 MIL/uL   Hemoglobin 5.8 (*) 13.0 - 17.0 g/dL   Comment: REPEATED TO VERIFY     CRITICAL VALUE NOTED.  VALUE IS CONSISTENT WITH PREVIOUSLY REPORTED AND CALLED VALUE.   HCT 17.4 (*) 39.0 - 52.0 %   MCV 82.1  78.0 - 100.0 fL   MCH 27.4  26.0 - 34.0 pg   MCHC 33.3  30.0 - 36.0 g/dL   RDW 15.0  11.5 - 15.5 %   Platelets 175  150 - 400 K/uL   Comment: REPEATED TO VERIFY  CBC     Status: Abnormal   Collection Time    10/26/13 11:15 PM      Result Value Ref Range   WBC 10.2  4.0 - 10.5 K/uL   RBC 1.99 (*) 4.22 - 5.81 MIL/uL   Hemoglobin 5.6 (*) 13.0 - 17.0 g/dL   Comment: CRITICAL VALUE NOTED.  VALUE IS CONSISTENT WITH PREVIOUSLY REPORTED AND CALLED VALUE.   HCT 16.2 (*) 39.0 - 52.0 %   MCV 81.4  78.0 - 100.0 fL   MCH 28.1  26.0 - 34.0 pg   MCHC 34.6  30.0 - 36.0 g/dL   RDW 15.2  11.5 - 15.5 %   Platelets 149 (*) 150 - 400 K/uL  PREPARE RBC (CROSSMATCH)     Status: None   Collection  Time    10/26/13 11:59 PM      Result Value Ref Range   Order Confirmation ORDER PROCESSED BY BLOOD BANK    GLUCOSE, CAPILLARY     Status: Abnormal   Collection Time    10/27/13  7:45 AM      Result Value Ref Range   Glucose-Capillary 167 (*) 70 - 99 mg/dL  TROPONIN I     Status: None   Collection Time    10/27/13  9:10 AM      Result Value Ref Range   Troponin I <0.30  <0.30 ng/mL   Comment:            Due to the release kinetics of cTnI,     a negative result within the first hours     of the onset of symptoms does not rule out     myocardial infarction with certainty.     If myocardial infarction is still suspected,     repeat the test at appropriate intervals.  COMPREHENSIVE METABOLIC PANEL     Status: Abnormal   Collection Time    10/27/13  9:10 AM      Result Value Ref Range   Sodium 142  137 - 147 mEq/L   Potassium 4.5  3.7 - 5.3 mEq/L   Chloride 112  96 - 112 mEq/L   CO2 20  19 - 32 mEq/L   Glucose, Bld 169 (*) 70 - 99 mg/dL   BUN 70 (*) 6 - 23 mg/dL   Creatinine, Ser 0.84  0.50 - 1.35 mg/dL   Calcium 7.2 (*) 8.4 - 10.5 mg/dL   Total Protein 3.6 (*) 6.0 - 8.3 g/dL   Albumin 1.6 (*) 3.5 - 5.2 g/dL   AST 10  0 - 37 U/L   ALT <5  0 - 53 U/L   Alkaline Phosphatase 43  39 - 117 U/L   Total Bilirubin 0.6  0.3 - 1.2 mg/dL   GFR calc non Af Amer 72 (*) >90 mL/min   GFR calc Af Amer 84 (*) >90 mL/min   Comment: (NOTE)     The eGFR has been calculated using the CKD EPI equation.     This calculation has not been validated in all clinical situations.     eGFR's persistently <90 mL/min signify possible Chronic Kidney     Disease.  CBC     Status: Abnormal   Collection Time    10/27/13  9:10 AM      Result Value Ref Range   WBC 9.7  4.0 - 10.5 K/uL   RBC 2.22 (*) 4.22 - 5.81 MIL/uL   Hemoglobin 6.5 (*) 13.0 - 17.0 g/dL   Comment: CRITICAL VALUE NOTED.  VALUE IS CONSISTENT WITH PREVIOUSLY REPORTED AND CALLED VALUE.   HCT 18.6 (*) 39.0 - 52.0 %   MCV 83.8  78.0 - 100.0  fL   MCH 29.3  26.0 - 34.0 pg   MCHC 34.9  30.0 - 36.0 g/dL   RDW 15.0  11.5 - 15.5 %   Platelets 131 (*) 150 - 400 K/uL  PREPARE RBC (CROSSMATCH)     Status: None   Collection Time    10/27/13  9:53 AM      Result Value Ref Range   Order Confirmation ORDER PROCESSED BY BLOOD BANK    GLUCOSE, CAPILLARY     Status: Abnormal   Collection Time    10/27/13  5:00 PM      Result Value Ref Range   Glucose-Capillary 172 (*) 70 - 99 mg/dL  CBC     Status: Abnormal   Collection Time    10/27/13  6:17 PM      Result Value Ref Range   WBC 12.8 (*) 4.0 - 10.5 K/uL   RBC 2.70 (*) 4.22 - 5.81 MIL/uL   Hemoglobin 7.9 (*) 13.0 - 17.0 g/dL   Comment: POST TRANSFUSION SPECIMEN   HCT 22.3 (*) 39.0 - 52.0 %   MCV 82.6  78.0 - 100.0 fL   MCH 29.3  26.0 - 34.0 pg   MCHC 35.4  30.0 - 36.0 g/dL   RDW 14.0  11.5 - 15.5 %   Platelets 127 (*) 150 - 400 K/uL  BASIC METABOLIC PANEL     Status: Abnormal   Collection Time    10/27/13 10:21 PM      Result Value Ref Range   Sodium 144  137 - 147 mEq/L   Potassium 4.1  3.7 - 5.3 mEq/L   Chloride  115 (*) 96 - 112 mEq/L   CO2 20  19 - 32 mEq/L   Glucose, Bld 161 (*) 70 - 99 mg/dL   BUN 65 (*) 6 - 23 mg/dL   Creatinine, Ser 0.83  0.50 - 1.35 mg/dL   Calcium 7.7 (*) 8.4 - 10.5 mg/dL   GFR calc non Af Amer 72 (*) >90 mL/min   GFR calc Af Amer 84 (*) >90 mL/min   Comment: (NOTE)     The eGFR has been calculated using the CKD EPI equation.     This calculation has not been validated in all clinical situations.     eGFR's persistently <90 mL/min signify possible Chronic Kidney     Disease.  CBC     Status: Abnormal   Collection Time    10/27/13 10:22 PM      Result Value Ref Range   WBC 14.9 (*) 4.0 - 10.5 K/uL   RBC 2.62 (*) 4.22 - 5.81 MIL/uL   Hemoglobin 7.5 (*) 13.0 - 17.0 g/dL   HCT 21.5 (*) 39.0 - 52.0 %   MCV 82.1  78.0 - 100.0 fL   MCH 28.6  26.0 - 34.0 pg   MCHC 34.9  30.0 - 36.0 g/dL   RDW 14.2  11.5 - 15.5 %   Platelets 127 (*) 150 - 400  K/uL  CBC     Status: Abnormal   Collection Time    10/28/13  2:55 AM      Result Value Ref Range   WBC 14.0 (*) 4.0 - 10.5 K/uL   RBC 2.48 (*) 4.22 - 5.81 MIL/uL   Hemoglobin 7.2 (*) 13.0 - 17.0 g/dL   HCT 20.3 (*) 39.0 - 52.0 %   MCV 81.9  78.0 - 100.0 fL   MCH 29.0  26.0 - 34.0 pg   MCHC 35.5  30.0 - 36.0 g/dL   RDW 14.7  11.5 - 15.5 %   Platelets 131 (*) 150 - 400 K/uL  GLUCOSE, CAPILLARY     Status: Abnormal   Collection Time    10/28/13  7:40 AM      Result Value Ref Range   Glucose-Capillary 147 (*) 70 - 99 mg/dL  CBC     Status: Abnormal   Collection Time    10/28/13  7:50 AM      Result Value Ref Range   WBC 12.4 (*) 4.0 - 10.5 K/uL   RBC 2.30 (*) 4.22 - 5.81 MIL/uL   Hemoglobin 6.7 (*) 13.0 - 17.0 g/dL   Comment: REPEATED TO VERIFY     CRITICAL RESULT CALLED TO, READ BACK BY AND VERIFIED WITH:     Hue DUNLOP RN AT 6295 10/28/13 BY WOOLLENK   HCT 19.1 (*) 39.0 - 52.0 %   MCV 83.0  78.0 - 100.0 fL   MCH 29.1  26.0 - 34.0 pg   MCHC 35.1  30.0 - 36.0 g/dL   RDW 15.0  11.5 - 15.5 %   Platelets 137 (*) 150 - 400 K/uL     Review of Systems  Constitutional: Positive for weight loss. Negative for fever and chills.  HENT: Negative.   Eyes: Negative.   Respiratory: Negative.   Cardiovascular: Negative.   Gastrointestinal: Positive for nausea, vomiting, blood in stool and melena.  Genitourinary: Negative for dysuria, urgency, frequency and hematuria.       On / off hematuria, none grossly at present  Musculoskeletal: Negative.   Skin: Negative.   Neurological: Negative for  focal weakness and loss of consciousness.  Endo/Heme/Allergies: Bruises/bleeds easily.  Psychiatric/Behavioral: Positive for memory loss. The patient is nervous/anxious.    Blood pressure 108/46, pulse 89, temperature 98.2 F (36.8 C), temperature source Oral, resp. rate 31, height $RemoveBe'5\' 10"'[id number]$  (1.778 m), weight 74.5 kg (164 lb 3.9 oz), SpO2 100.00%. Physical Exam  Constitutional: He appears  well-developed and well-nourished.  Elderly, but appears younger than stated age. Family at bedside in ICU.  HENT:  Head: Normocephalic and atraumatic.  Eyes: EOM are normal. Pupils are equal, round, and reactive to light.  Neck: Normal range of motion. Neck supple.  Cardiovascular: Normal rate.   Respiratory: Effort normal.  GI: Soft.  Genitourinary: Penis normal.  Condom cath in place with clear yellow urine, no pelvic distension  Musculoskeletal: Normal range of motion.  Rt femoral dressing place.  Neurological: He is alert.  Skin: Skin is warm and dry.  Psychiatric: He has a normal mood and affect. His behavior is normal. Judgment and thought content normal.    Assessment/Plan:  1 - Bladder Cancer - likely stable, active but controlled disease by imaging and symptoms. No role for further specific therapy this admission. F/U as scheduled as outpatient.   2 - Gross Hematuria - Likely do to bladder cancer bleeding on occasion as per above. Resolved grossly and likely NOT significant contributor to anemia  in light of upper GI bleed. This was explained to family at bedside to their satisfaction.  3 - Lower Urinary Tract Symptoms - Doing well presently and at baseline. Should obstructive symptoms progress, would consider addition of finasteride. Agree with condom cath for now for accurate UOP measures and avoid foley if possible.  4 - Pt will be seen on prn basis going forward this admission, please call anytime for any specific questions. Strongly commend primary team, GI team, and IR service for their management.    Alexis Frock 10/28/2013, 9:49 AM

## 2013-10-28 NOTE — Progress Notes (Signed)
Internal Medicine Teaching Service Transfer Progress Note  Date: 10/28/2013               Patient Name:  Omar [name] MRN: [id number]  DOB: Nov 17, 1915 Age / Sex: 80 y.o., male   PCP: Leonard Downing, MD         Medical Service: Internal Medicine Teaching Service          Chief Complaint: Symptomatic bleeding  Omar [name] is a 80 yo male pleasant male with PMH of Bladder CA, BPH, and prior DVT admitted to [facility] on 10/26/13 for symptomatic anemia.  Noted to have some blood in urine, brief syncopal episode prior to admission at home with son, and large black BM as well. He admits to taking NSAIDs daily prior to admission as well. Did not endorse abdominal pain. BP remained soft initially but responding to IVF and blood. Hb on admission was 6.2, down from 10.8 on 07/26/13.  GI was consulted and EGD was planned for 10/27/13.  Over the course of his admission, he has thus far received approximately 8 units PRBC.  EGD by Dr. Benson Norway 4/18 revealed duodenal bulb ulcer with arterial spurting not amenable to endoscopic control and IR was consulted for urgent embolization.  Dr. Laurence Ferrari performed urgent mesenteric angiogram and coil embolization of GDA on 4/18 as well. ICU subsequently took over care at that time. INR was 1.19 and platelets trended down from 228 on admission to 137 today.  His hemoglobin improved to 7.9 evening of 4/18 and dropped to 6.7 this morning (4/19). He was transfused 1 more unit of PRBC this morning and post-transfusion cbc is pending.   Today, he is in good spirits with no complaints. He is thirsty. No abdominal pain. He apparently had 5 more dark stools since yesterday evening per radiology note.  He was seen by Dr. Tresa Moore this morning, who was kind enough to consult this morning and recommends outpatient follow up as scheduled.  His son was by beside as well.   Meds: Current Facility-Administered Medications  Medication Dose Route Frequency Provider Last Rate Last Dose  . 0.45  % sodium chloride infusion   Intravenous Continuous Michail Jewels, MD      . antiseptic oral rinse (BIOTENE) solution 15 mL  15 mL Mouth Rinse BID Dominic Pea, DO   15 mL at 10/28/13 0800  . pantoprazole (PROTONIX) injection 40 mg  40 mg Intravenous Q12H Jerene Pitch, MD   40 mg at 10/28/13 1023  . sodium chloride 0.9 % bolus 500 mL  500 mL Intravenous Once Ivin Poot, MD      . sodium chloride 0.9 % injection 3 mL  3 mL Intravenous Q12H Jerene Pitch, MD   3 mL at 10/28/13 1000  . sucralfate (CARAFATE) tablet 1 g  1 g Oral TID WC & HS Beryle Beams, MD   1 g at 10/28/13 1103  . white petrolatum (VASELINE) gel             Allergies: Allergies as of 10/26/2013 - Review Complete 10/26/2013  Allergen Reaction Noted  . Benadryl [diphenhydramine hcl] Other (See Comments) 10/16/2012  . Latex Other (See Comments) 10/16/2012   Past Medical History  Diagnosis Date  . History of pulmonary embolus (PE)     APRIL 2011--  MULTIPLE PE'S AND DVT  . BPH (benign prostatic hypertrophy)   . History of urinary retention   . History of transurethral destruction of bladder lesion   . H/O asbestos exposure   .  History of DVT (deep vein thrombosis)     APRIL 2011--  UPPER AND LOWER EXTREMITIES  . Bladder tumor     recurrent  . Nocturia   . Heart murmur, systolic     enlarged  . Bladder cancer 10/20/12    high grade papillary/invasive urothelial  . Hx of radiation therapy 01/02/13- 02/19/13    subtotal bladder 4500 cGy 25 sessions, boost 1600 cGy 8 sessions  . Skin cancer 03/2013    left cheek  . Asbestosis   . Fracture   . Hx of radiation therapy 01/02/13- 02/19/13    subtotal bladder 4500 cGy 25 sessions, reduced field boost 1600 cGy 8 sessions   Past Surgical History  Procedure Laterality Date  . Repair recurrent left inguinal hernia  06-01-1999    x 2  . Transurethral resection of prostate  1999   &  11-17-2000  . Cysto/ eua/ placement suprapubic tube  01-13-2000  . Transurethral  resection of bladder tumor  06-30-2010  . Transurethral resection of prostate  07-31-2010    AND BLADDER RESECTION BLADDER TUMOR  . Vena cava filter placement  APRIL 2011    INFERIOR  . Transthoracic echocardiogram  10-11-2012   DR TILLEY    CONCENTRIC LVH WITH NORMAL LVSF/ EF [id number] GRADE I DIASTOLIC DYSFUNCTION  . Transurethral resection of bladder tumor N/A 10/20/2012    Procedure: TRANSURETHRAL RESECTION OF BLADDER TUMOR (TURBT);  Surgeon: Hanley Ben, MD;  Location: [facility];  Service: Urology;  Laterality: N/A;  . Cystoscopy N/A 10/20/2012    Procedure: CYSTOSCOPY;  Surgeon: Hanley Ben, MD;  Location: [facility];  Service: Urology;  Laterality: N/A;  . Cataract surgery      BILAT. EYES  . Mohs surgery  03/2013    left cheek  . Hip arthroplasty Left 05/01/2013    Procedure: ARTHROPLASTY BIPOLAR HIP;  Surgeon: Renette Butters, MD;  Location: [place];  Service: Orthopedics;  Laterality: Left;   History reviewed. No pertinent family history. History   Social History  . Marital Status: Married    Spouse Name: N/A    Number of Children: 4  . Years of Education: N/A   Occupational History  . RETIRED     [employer]   Social History Main Topics  . Smoking status: Never Smoker   . Smokeless tobacco: Never Used  . Alcohol Use: No  . Drug Use: No  . Sexual Activity: Not on file   Other Topics Concern  . Not on file   Social History Narrative   Married for 73 years.drives to see wife daily and feed lunch daily   WWll 2 years   No family history of cancer/prostate   Review of Systems:  Constitutional:  Denies fever, chills  HEENT:  Denies congestion  Respiratory:  Denies SOB, DOE, cough, and wheezing  Cardiovascular:  Denies palpitations and leg swelling.   Gastrointestinal:  Denies nausea, vomiting, abdominal pain  Genitourinary:  Denies dysuria, urgency, frequency  Musculoskeletal:  Denies back pain  Skin:  Denies rash   Neurological:  Denies headaches.    Physical Exam: Blood pressure 104/45, pulse 78, temperature 98.4 F (36.9 C), temperature source Oral, resp. rate 20, height 5\' 10"  (1.778 m), weight 164 lb 3.9 oz (74.5 kg), SpO2 100.00%. Vitals reviewed. General: resting in bed, NAD HEENT: EOMI Cardiac: RRR, +SEM, loudest LUS border Pulm: clear to auscultation bilaterally, no wheezes Abd: soft, nontender, nondistended, BS present. Dressing in place on site of  left femoral vein, dried up blood Ext: warm and well perfused, no pedal edema, moving all 4 extremities Neuro: alert and oriented X3, strength and sensation to light touch equal in bilateral upper and lower extremities  Lab results: Basic Metabolic Panel:  Recent Labs  10/27/13 0910 10/27/13 2221  NA 142 144  K 4.5 4.1  CL 112 115*  CO2 20 20  GLUCOSE 169* 161*  BUN 70* 65*  CREATININE 0.84 0.83  CALCIUM 7.2* 7.7*   Liver Function Tests:  Recent Labs  10/27/13 0910  AST 10  ALT <5  ALKPHOS 43  BILITOT 0.6  PROT 3.6*  ALBUMIN 1.6*   CBC:  Recent Labs  10/26/13 0900  10/28/13 0255 10/28/13 0750  WBC 10.9*  < > 14.0* 12.4*  NEUTROABS 10.0*  --   --   --   HGB 6.2*  < > 7.2* 6.7*  HCT 18.8*  < > 20.3* 19.1*  MCV 82.1  < > 81.9 83.0  PLT 228  < > 131* 137*  < > = values in this interval not displayed. Cardiac Enzymes:  Recent Labs  10/26/13 2130 10/27/13 0910  TROPONINI <0.30 <0.30   CBG:  Recent Labs  10/26/13 1508 10/27/13 0745 10/27/13 1700 10/28/13 0740  GLUCAP 145* 167* 172* 147*   Coagulation:  Recent Labs  10/26/13 2130  LABPROT 14.8  INR 1.19   Urine Drug Screen: Drugs of Abuse     Component Value Date/Time   LABOPIA NONE DETECTED 10/26/2013 1400   COCAINSCRNUR NONE DETECTED 10/26/2013 1400   LABBENZ NONE DETECTED 10/26/2013 1400   AMPHETMU NONE DETECTED 10/26/2013 1400   THCU NONE DETECTED 10/26/2013 1400   LABBARB NONE DETECTED 10/26/2013 1400    Urinalysis:  Recent Labs   10/26/13 1400  COLORURINE YELLOW  LABSPEC 1.022  PHURINE 5.0  GLUCOSEU NEGATIVE  HGBUR LARGE*  BILIRUBINUR NEGATIVE  KETONESUR NEGATIVE  PROTEINUR 30*  UROBILINOGEN 0.2  NITRITE NEGATIVE  LEUKOCYTESUR LARGE*   Imaging results:  Ct Abdomen Pelvis Wo Contrast  10/27/2013   CLINICAL DATA:  Low hemoglobin.  Question intra-abdominal bleed.  EXAM: CT ABDOMEN AND PELVIS WITHOUT CONTRAST  TECHNIQUE: Multidetector CT imaging of the abdomen and pelvis was performed following the standard protocol without intravenous contrast.  COMPARISON:  CT ABD/PELVIS W CM dated 04/24/2013  FINDINGS: Numerous bilateral calcified pleural plaques. Mild cardiomegaly. No confluent airspace opacities or effusions.  Small layering gallstones within the gallbladder. Liver, pancreas, spleen, adrenals and kidneys have an unremarkable unenhanced appearance. IVC filter is noted in the infrarenal IVC. Aortic and iliac calcifications without aneurysm.  There is prostate enlargement. There is beam hardening artifact from a left hip replacement which obscures lower pelvic structures. Previously seen large posterior bladder wall diverticulum is partially obscured by the beam hardening artifact. 9 mm calcification in the left side of the pelvis was shown on prior CT to be within this large diverticulum.  No evidence of retroperitoneal hematoma. No free fluid, free air or adenopathy. Stomach, large and small bowel are grossly unremarkable.  Diffuse degenerative changes throughout the thoracolumbar spine. No acute bony abnormality.  IMPRESSION: No retroperitoneal or intra-abdominal hemorrhage.  Cholelithiasis.  Markedly enlarged prostate. Large posterior bladder wall diverticulum noted on prior CT partially obscured by left hip replacement.  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   Electronically Signed   By: Rolm Baptise M.D.   On: 10/27/2013 01:30   Ir Angiogram Visceral  Selective  10/27/2013  CLINICAL DATA:  80 year old male with acute anemia secondary to active upper GI bleed. On upper endoscopy he was found have not actively bleeding duodenum ulcer. A clip was placed but this was insufficient to control the hemorrhage. The patient was taken urgently to interventional Radiology for angiogram and embolization.  EXAM: SELECTIVE VISCERAL ARTERIOGRAPHY; ARTERIOGRAPHY; IR EMBO ART VEN HEMORR LYMPH EXTRAV INC GUIDE [name]; IR ULTRASOUND GUIDANCE VASC ACCESS RIGHT  Date: 10/27/2013  PROCEDURE: 1. Ultrasound-guided puncture of the right common femoral artery 2. Catheterization of celiac artery with arteriogram 3. Catheterization of the common hepatic artery with arteriogram 4. Catheterization of the gastroduodenal artery with arteriogram 5. Catheterization of a super duodenum branch of the gastroduodenal artery with arteriogram 6. Coil embolization of the supra duodenal branch 7. Coil embolization of the gastroduodenal artery 8. Catheterization of the superior mesenteric artery with arteriogram 9. Limited right common femoral arteriogram 10. Application of a Mynx percutaneous arterial closure device Interventional Radiologist:  Criselda Peaches, MD  ANESTHESIA/SEDATION: Moderate (conscious) sedation was used. 4 mg Versed, 50 mcg Fentanyl were administered intravenously. The patient's vital signs were monitored continuously by radiology nursing throughout the procedure.  Sedation Time: 75 minutes  FLUOROSCOPY TIME:  27 min 24 seconds  CONTRAST:  21mL OMNIPAQUE IOHEXOL 300 MG/ML  SOLN  TECHNIQUE: Informed consent was obtained from the patient following explanation of the procedure, risks, benefits and alternatives. The patient understands, agrees and consents for the procedure. All questions were addressed. A time out was performed.  Maximal barrier sterile technique utilized including caps, mask, sterile gowns, sterile gloves, large sterile drape, hand hygiene, and Betadine skin  prep.  The right groin was interrogated with ultrasound. The common femoral artery was found to be widely patent. Local anesthesia was attained by infiltration with 1% lidocaine. A small dermatotomy was made. Under real-time sonographic guidance, the common femoral artery was punctured with a 21 gauge micropuncture needle. Image was obtained and stored for the medical record. Using a transitional 5 French micro sheath, a Bentson wire was advanced of the abdominal aorta. A C2 Cobra catheter was advanced to the aorta. The celiac artery was selected and an arteriogram attempted. However, the C2 catheter achieved insufficient purchase. Therefore, the C2 catheter was exchanged for a Mickelson catheter. The Mickelson catheter was secured is slightly more secure early and celiac artery origin. The celiac arteriogram was performed. There is is relatively conventional hepatic arterial anatomy. The gastroduodenal artery is relatively identified.  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 A Renegade ST micro catheter was then navigated into the gastroduodenal artery. Gastroduodenal arteriogram was performed. No definite active hemorrhage is identified. There is a supraduodenal branch off the proximal GDA. This was catheterized and an arteriogram performed. Again, no active arterial bleeding. However, the vessels are in the immediate vicinity of the endoscopically placed clips. The decision was made to proceed with coil embolization of the supraduodenal arterial branch and gastroduodenal artery to decrease the pressure head and reduce the risk and severity of recurrent bleeding. The supraduodenal branch was coil embolized using first a 2 x 4 x 41 mm interlock diamond coil followed by a 3 x 120 mm interlock coil. The micro catheter was then advanced into the gastroduodenal artery at its junction with the right  gastric artery. The vessel was then coil embolized using first a 6 x 100 mm interlock coil followed by a single 5  x 80 mm coil and two 5 x 150 mm coils. The vessel was embolized nearly to the origin. The micro catheter was pulled back into the common hepatic artery and a post embolization arteriogram was performed confirming no further flow into the GDA or supraduodenal branch vessels.  The micro catheter was removed. The Mickelson catheter was navigated in this SMA. A superior mesenteric arteriogram was performed. No definite vascularity in the region of the clips. No retrograde filling of the coil packs. The catheter was removed.  Through the access sheath, a limited right common femoral arteriogram was performed. The access is within the common femoral artery above the bifurcation below the inferior genu of the inferior epigastric artery. The groin was re-prepped with chlorhexidine. 2 g Ancef was administered intravenously. Arterial hemostasis was achieved with the assistance of a Mynx Ace arterial closure device.  IMPRESSION: 1. Successful coil embolization of the gastroduodenal artery and proximal supraduodenal branch in the region of the endoscopically placed clips. 2. No definite active arterial hemorrhage was identified during the angiogram. 3. The patient received an additional unit of blood in interventional radiology in addition to the 1 unit received in endoscopy for a total of 2 units during his procedural course this afternoon. 4. By the end the procedure, the patient had attained hemodynamic stability.  Signed,  Criselda Peaches, MD  [facility]  [facility]   Electronically Signed   By: Jacqulynn Cadet M.D.   On: 10/27/2013 17:07   Ir Angiogram Visceral Selective  10/27/2013   CLINICAL DATA:  80 year old male with acute anemia secondary to active upper GI bleed. On upper endoscopy he was found have not actively bleeding duodenum ulcer. A clip was  placed but this was insufficient to control the hemorrhage. The patient was taken urgently to interventional Radiology for angiogram and embolization.  EXAM: SELECTIVE VISCERAL ARTERIOGRAPHY; ARTERIOGRAPHY; IR EMBO ART VEN HEMORR LYMPH EXTRAV INC GUIDE [name]; IR ULTRASOUND GUIDANCE VASC ACCESS RIGHT  Date: 10/27/2013  PROCEDURE: 1. Ultrasound-guided puncture of the right common femoral artery 2. Catheterization of celiac artery with arteriogram 3. Catheterization of the common hepatic artery with arteriogram 4. Catheterization of the gastroduodenal artery with arteriogram 5. Catheterization of a super duodenum branch of the gastroduodenal artery with arteriogram 6. Coil embolization of the supra duodenal branch 7. Coil embolization of the gastroduodenal artery 8. Catheterization of the superior mesenteric artery with arteriogram 9. Limited right common femoral arteriogram 10. Application of a Mynx percutaneous arterial closure device Interventional Radiologist:  Criselda Peaches, MD  ANESTHESIA/SEDATION: Moderate (conscious) sedation was used. 4 mg Versed, 50 mcg Fentanyl were administered intravenously. The patient's vital signs were monitored continuously by radiology nursing throughout the procedure.  Sedation Time: 75 minutes  FLUOROSCOPY TIME:  27 min 24 seconds  CONTRAST:  18mL OMNIPAQUE IOHEXOL 300 MG/ML  SOLN  TECHNIQUE: Informed consent was obtained from the patient following explanation of the procedure, risks, benefits and alternatives. The patient understands, agrees and consents for the procedure. All questions were addressed. A time out was performed.  Maximal barrier sterile technique utilized including caps, mask, sterile gowns, sterile gloves, large sterile drape, hand hygiene, and Betadine skin prep.  The right groin was interrogated with ultrasound. The common femoral artery was found to be widely patent. Local anesthesia was attained by infiltration with 1% lidocaine. A small dermatotomy  was made. Under real-time sonographic guidance, the common femoral artery was punctured with a 21 gauge micropuncture needle. Image was obtained and  stored for the medical record. Using a transitional 5 French micro sheath, a Bentson wire was advanced of the abdominal aorta. A C2 Cobra catheter was advanced to the aorta. The celiac artery was selected and an arteriogram attempted. However, the C2 catheter achieved insufficient purchase. Therefore, the C2 catheter was exchanged for a Mickelson catheter. The Mickelson catheter was secured is slightly more secure early and celiac artery origin. The celiac arteriogram was performed. There is is relatively conventional hepatic arterial anatomy. The gastroduodenal artery is relatively identified.  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 A Renegade ST micro catheter was then navigated into the gastroduodenal artery. Gastroduodenal arteriogram was performed. No definite active hemorrhage is identified. There is a supraduodenal branch off the proximal GDA. This was catheterized and an arteriogram performed. Again, no active arterial bleeding. However, the vessels are in the immediate vicinity of the endoscopically placed clips. The decision was made to proceed with coil embolization of the supraduodenal arterial branch and gastroduodenal artery to decrease the pressure head and reduce the risk and severity of recurrent bleeding. The supraduodenal branch was coil embolized using first a 2 x 4 x 41 mm interlock diamond coil followed by a 3 x 120 mm interlock coil. The micro catheter was then advanced into the gastroduodenal artery at its junction with the right gastric artery. The vessel was then coil embolized using first a 6 x 100 mm interlock coil followed by a single 5 x 80 mm coil and two 5 x 150 mm coils. The vessel was embolized nearly to the origin. The  micro catheter was pulled back into the common hepatic artery and a post embolization arteriogram was performed confirming no further flow into the GDA or supraduodenal branch vessels.  The micro catheter was removed. The Mickelson catheter was navigated in this SMA. A superior mesenteric arteriogram was performed. No definite vascularity in the region of the clips. No retrograde filling of the coil packs. The catheter was removed.  Through the access sheath, a limited right common femoral arteriogram was performed. The access is within the common femoral artery above the bifurcation below the inferior genu of the inferior epigastric artery. The groin was re-prepped with chlorhexidine. 2 g Ancef was administered intravenously. Arterial hemostasis was achieved with the assistance of a Mynx Ace arterial closure device.  IMPRESSION: 1. Successful coil embolization of the gastroduodenal artery and proximal supraduodenal branch in the region of the endoscopically placed clips. 2. No definite active arterial hemorrhage was identified during the angiogram. 3. The patient received an additional unit of blood in interventional radiology in addition to the 1 unit received in endoscopy for a total of 2 units during his procedural course this afternoon. 4. By the end the procedure, the patient had attained hemodynamic stability.  Signed,  Criselda Peaches, MD  [facility]  [facility]   Electronically Signed   By: Jacqulynn Cadet M.D.   On: 10/27/2013 17:07   Ir Angiogram Follow Up Study  10/27/2013   CLINICAL DATA:  80 year old male with acute anemia secondary to active upper GI bleed. On upper endoscopy he was found have not actively bleeding duodenum ulcer. A clip was placed but this was insufficient to control the hemorrhage. The patient was taken urgently to interventional Radiology for angiogram and embolization.  EXAM: SELECTIVE VISCERAL ARTERIOGRAPHY; ARTERIOGRAPHY;  IR EMBO ART VEN HEMORR LYMPH EXTRAV  INC GUIDE [name]; IR ULTRASOUND GUIDANCE VASC ACCESS RIGHT  Date: 10/27/2013  PROCEDURE: 1. Ultrasound-guided puncture of the right common femoral artery 2. Catheterization of celiac artery with arteriogram 3. Catheterization of the common hepatic artery with arteriogram 4. Catheterization of the gastroduodenal artery with arteriogram 5. Catheterization of a super duodenum branch of the gastroduodenal artery with arteriogram 6. Coil embolization of the supra duodenal branch 7. Coil embolization of the gastroduodenal artery 8. Catheterization of the superior mesenteric artery with arteriogram 9. Limited right common femoral arteriogram 10. Application of a Mynx percutaneous arterial closure device Interventional Radiologist:  Criselda Peaches, MD  ANESTHESIA/SEDATION: Moderate (conscious) sedation was used. 4 mg Versed, 50 mcg Fentanyl were administered intravenously. The patient's vital signs were monitored continuously by radiology nursing throughout the procedure.  Sedation Time: 75 minutes  FLUOROSCOPY TIME:  27 min 24 seconds  CONTRAST:  43mL OMNIPAQUE IOHEXOL 300 MG/ML  SOLN  TECHNIQUE: Informed consent was obtained from the patient following explanation of the procedure, risks, benefits and alternatives. The patient understands, agrees and consents for the procedure. All questions were addressed. A time out was performed.  Maximal barrier sterile technique utilized including caps, mask, sterile gowns, sterile gloves, large sterile drape, hand hygiene, and Betadine skin prep.  The right groin was interrogated with ultrasound. The common femoral artery was found to be widely patent. Local anesthesia was attained by infiltration with 1% lidocaine. A small dermatotomy was made. Under real-time sonographic guidance, the common femoral artery was punctured with a 21 gauge micropuncture needle. Image was obtained and stored for the medical record. Using a transitional 5  French micro sheath, a Bentson wire was advanced of the abdominal aorta. A C2 Cobra catheter was advanced to the aorta. The celiac artery was selected and an arteriogram attempted. However, the C2 catheter achieved insufficient purchase. Therefore, the C2 catheter was exchanged for a Mickelson catheter. The Mickelson catheter was secured is slightly more secure early and celiac artery origin. The celiac arteriogram was performed. There is is relatively conventional hepatic arterial anatomy. The gastroduodenal artery is relatively identified.  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 A Renegade ST micro catheter was then navigated into the gastroduodenal artery. Gastroduodenal arteriogram was performed. No definite active hemorrhage is identified. There is a supraduodenal branch off the proximal GDA. This was catheterized and an arteriogram performed. Again, no active arterial bleeding. However, the vessels are in the immediate vicinity of the endoscopically placed clips. The decision was made to proceed with coil embolization of the supraduodenal arterial branch and gastroduodenal artery to decrease the pressure head and reduce the risk and severity of recurrent bleeding. The supraduodenal branch was coil embolized using first a 2 x 4 x 41 mm interlock diamond coil followed by a 3 x 120 mm interlock coil. The micro catheter was then advanced into the gastroduodenal artery at its junction with the right gastric artery. The vessel was then coil embolized using first a 6 x 100 mm interlock coil followed by a single 5 x 80 mm coil and two 5 x 150 mm coils. The vessel was embolized nearly to the origin. The micro catheter was pulled back into the common hepatic artery and a post embolization arteriogram was performed confirming no further flow into the GDA or supraduodenal branch vessels.  The micro  catheter was removed. The Mickelson catheter was navigated in this SMA. A  superior mesenteric arteriogram was performed. No definite vascularity in the region of the clips. No retrograde filling of the coil packs. The catheter was removed.  Through the access sheath, a limited right common femoral arteriogram was performed. The access is within the common femoral artery above the bifurcation below the inferior genu of the inferior epigastric artery. The groin was re-prepped with chlorhexidine. 2 g Ancef was administered intravenously. Arterial hemostasis was achieved with the assistance of a Mynx Ace arterial closure device.  IMPRESSION: 1. Successful coil embolization of the gastroduodenal artery and proximal supraduodenal branch in the region of the endoscopically placed clips. 2. No definite active arterial hemorrhage was identified during the angiogram. 3. The patient received an additional unit of blood in interventional radiology in addition to the 1 unit received in endoscopy for a total of 2 units during his procedural course this afternoon. 4. By the end the procedure, the patient had attained hemodynamic stability.  Signed,  Criselda Peaches, MD  [facility]  [facility]   Electronically Signed   By: Jacqulynn Cadet M.D.   On: 10/27/2013 17:07   Ir US Guide Vasc Access Right  10/27/2013   CLINICAL DATA:  80 year old male with acute anemia secondary to active upper GI bleed. On upper endoscopy he was found have not actively bleeding duodenum ulcer. A clip was placed but this was insufficient to control the hemorrhage. The patient was taken urgently to interventional Radiology for angiogram and embolization.  EXAM: SELECTIVE VISCERAL ARTERIOGRAPHY; ARTERIOGRAPHY; IR EMBO ART VEN HEMORR LYMPH EXTRAV INC GUIDE [name]; IR ULTRASOUND GUIDANCE VASC ACCESS RIGHT  Date: 10/27/2013  PROCEDURE: 1. Ultrasound-guided puncture of the right common femoral artery 2.  Catheterization of celiac artery with arteriogram 3. Catheterization of the common hepatic artery with arteriogram 4. Catheterization of the gastroduodenal artery with arteriogram 5. Catheterization of a super duodenum branch of the gastroduodenal artery with arteriogram 6. Coil embolization of the supra duodenal branch 7. Coil embolization of the gastroduodenal artery 8. Catheterization of the superior mesenteric artery with arteriogram 9. Limited right common femoral arteriogram 10. Application of a Mynx percutaneous arterial closure device Interventional Radiologist:  Criselda Peaches, MD  ANESTHESIA/SEDATION: Moderate (conscious) sedation was used. 4 mg Versed, 50 mcg Fentanyl were administered intravenously. The patient's vital signs were monitored continuously by radiology nursing throughout the procedure.  Sedation Time: 75 minutes  FLUOROSCOPY TIME:  27 min 24 seconds  CONTRAST:  109mL OMNIPAQUE IOHEXOL 300 MG/ML  SOLN  TECHNIQUE: Informed consent was obtained from the patient following explanation of the procedure, risks, benefits and alternatives. The patient understands, agrees and consents for the procedure. All questions were addressed. A time out was performed.  Maximal barrier sterile technique utilized including caps, mask, sterile gowns, sterile gloves, large sterile drape, hand hygiene, and Betadine skin prep.  The right groin was interrogated with ultrasound. The common femoral artery was found to be widely patent. Local anesthesia was attained by infiltration with 1% lidocaine. A small dermatotomy was made. Under real-time sonographic guidance, the common femoral artery was punctured with a 21 gauge micropuncture needle. Image was obtained and stored for the medical record. Using a transitional 5 French micro sheath, a Bentson wire was advanced of the abdominal aorta. A C2 Cobra catheter was advanced to the aorta. The celiac artery was selected and an arteriogram attempted. However, the C2  catheter achieved insufficient purchase. Therefore, the C2 catheter was exchanged for a Mickelson catheter. The Mickelson catheter was secured  is slightly more secure early and celiac artery origin. The celiac arteriogram was performed. There is is relatively conventional hepatic arterial anatomy. The gastroduodenal artery is relatively identified.  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 A Renegade ST micro catheter was then navigated into the gastroduodenal artery. Gastroduodenal arteriogram was performed. No definite active hemorrhage is identified. There is a supraduodenal branch off the proximal GDA. This was catheterized and an arteriogram performed. Again, no active arterial bleeding. However, the vessels are in the immediate vicinity of the endoscopically placed clips. The decision was made to proceed with coil embolization of the supraduodenal arterial branch and gastroduodenal artery to decrease the pressure head and reduce the risk and severity of recurrent bleeding. The supraduodenal branch was coil embolized using first a 2 x 4 x 41 mm interlock diamond coil followed by a 3 x 120 mm interlock coil. The micro catheter was then advanced into the gastroduodenal artery at its junction with the right gastric artery. The vessel was then coil embolized using first a 6 x 100 mm interlock coil followed by a single 5 x 80 mm coil and two 5 x 150 mm coils. The vessel was embolized nearly to the origin. The micro catheter was pulled back into the common hepatic artery and a post embolization arteriogram was performed confirming no further flow into the GDA or supraduodenal branch vessels.  The micro catheter was removed. The Mickelson catheter was navigated in this SMA. A superior mesenteric arteriogram was performed. No definite vascularity in the region of the clips. No retrograde filling of the  coil packs. The catheter was removed.  Through the access sheath, a limited right common femoral arteriogram was performed. The access is within the common femoral artery above the bifurcation below the inferior genu of the inferior epigastric artery. The groin was re-prepped with chlorhexidine. 2 g Ancef was administered intravenously. Arterial hemostasis was achieved with the assistance of a Mynx Ace arterial closure device.  IMPRESSION: 1. Successful coil embolization of the gastroduodenal artery and proximal supraduodenal branch in the region of the endoscopically placed clips. 2. No definite active arterial hemorrhage was identified during the angiogram. 3. The patient received an additional unit of blood in interventional radiology in addition to the 1 unit received in endoscopy for a total of 2 units during his procedural course this afternoon. 4. By the end the procedure, the patient had attained hemodynamic stability.  Signed,  Criselda Peaches, MD  [facility]  [facility]   Electronically Signed   By: Jacqulynn Cadet M.D.   On: 10/27/2013 17:07   Dg Chest Port 1 View  10/27/2013   CLINICAL DATA:  Duodenal ulcer coiling. Postprocedure. Asbestos exposure. A risk for aspiration.  EXAM: PORTABLE CHEST - 1 VIEW  COMPARISON:  10/26/2013  FINDINGS: Artifact degradation over the lung apices. Midline trachea. Moderate cardiomegaly. Left greater than right calcified pleural plaques. No pneumothorax. Mild volume loss at the medial left lung base. Diffuse peribronchial thickening.  IMPRESSION: Mild opacity at the medial left lung base.  Favor atelectasis.  Cardiomegaly without congestive failure.  Asbestos related pleural disease.   Electronically Signed   By: Abigail Miyamoto M.D.   On: 10/27/2013 19:10   Omar [name]  10/27/2013   CLINICAL DATA:  80 year old male with acute anemia secondary to active  upper GI bleed.  On upper endoscopy he was found have not actively bleeding duodenum ulcer. A clip was placed but this was insufficient to control the hemorrhage. The patient was taken urgently to interventional Radiology for angiogram and embolization.  EXAM: SELECTIVE VISCERAL ARTERIOGRAPHY; ARTERIOGRAPHY; IR EMBO ART VEN HEMORR LYMPH EXTRAV INC GUIDE [name]; IR ULTRASOUND GUIDANCE VASC ACCESS RIGHT  Date: 10/27/2013  PROCEDURE: 1. Ultrasound-guided puncture of the right common femoral artery 2. Catheterization of celiac artery with arteriogram 3. Catheterization of the common hepatic artery with arteriogram 4. Catheterization of the gastroduodenal artery with arteriogram 5. Catheterization of a super duodenum branch of the gastroduodenal artery with arteriogram 6. Coil embolization of the supra duodenal branch 7. Coil embolization of the gastroduodenal artery 8. Catheterization of the superior mesenteric artery with arteriogram 9. Limited right common femoral arteriogram 10. Application of a Mynx percutaneous arterial closure device Interventional Radiologist:  Criselda Peaches, MD  ANESTHESIA/SEDATION: Moderate (conscious) sedation was used. 4 mg Versed, 50 mcg Fentanyl were administered intravenously. The patient's vital signs were monitored continuously by radiology nursing throughout the procedure.  Sedation Time: 75 minutes  FLUOROSCOPY TIME:  27 min 24 seconds  CONTRAST:  56mL OMNIPAQUE IOHEXOL 300 MG/ML  SOLN  TECHNIQUE: Informed consent was obtained from the patient following explanation of the procedure, risks, benefits and alternatives. The patient understands, agrees and consents for the procedure. All questions were addressed. A time out was performed.  Maximal barrier sterile technique utilized including caps, mask, sterile gowns, sterile gloves, large sterile drape, hand hygiene, and Betadine skin prep.  The right groin was interrogated with ultrasound. The common femoral artery was found to be widely patent.  Local anesthesia was attained by infiltration with 1% lidocaine. A small dermatotomy was made. Under real-time sonographic guidance, the common femoral artery was punctured with a 21 gauge micropuncture needle. Image was obtained and stored for the medical record. Using a transitional 5 French micro sheath, a Bentson wire was advanced of the abdominal aorta. A C2 Cobra catheter was advanced to the aorta. The celiac artery was selected and an arteriogram attempted. However, the C2 catheter achieved insufficient purchase. Therefore, the C2 catheter was exchanged for a Mickelson catheter. The Mickelson catheter was secured is slightly more secure early and celiac artery origin. The celiac arteriogram was performed. There is is relatively conventional hepatic arterial anatomy. The gastroduodenal artery is relatively identified.  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 A Renegade ST micro catheter was then navigated into the gastroduodenal artery. Gastroduodenal arteriogram was performed. No definite active hemorrhage is identified. There is a supraduodenal branch off the proximal GDA. This was catheterized and an arteriogram performed. Again, no active arterial bleeding. However, the vessels are in the immediate vicinity of the endoscopically placed clips. The decision was made to proceed with coil embolization of the supraduodenal arterial branch and gastroduodenal artery to decrease the pressure head and reduce the risk and severity of recurrent bleeding. The supraduodenal branch was coil embolized using first a 2 x 4 x 41 mm interlock diamond coil followed by a 3 x 120 mm interlock coil. The micro catheter was then advanced into the gastroduodenal artery at its junction with the right gastric artery. The vessel was then coil embolized using first a 6 x 100 mm interlock coil followed by a single 5 x 80  mm coil and two 5 x 150 mm  coils. The vessel was embolized nearly to the origin. The micro catheter was pulled back into the common hepatic artery and a post embolization arteriogram was performed confirming no further flow into the GDA or supraduodenal branch vessels.  The micro catheter was removed. The Mickelson catheter was navigated in this SMA. A superior mesenteric arteriogram was performed. No definite vascularity in the region of the clips. No retrograde filling of the coil packs. The catheter was removed.  Through the access sheath, a limited right common femoral arteriogram was performed. The access is within the common femoral artery above the bifurcation below the inferior genu of the inferior epigastric artery. The groin was re-prepped with chlorhexidine. 2 g Ancef was administered intravenously. Arterial hemostasis was achieved with the assistance of a Mynx Ace arterial closure device.  IMPRESSION: 1. Successful coil embolization of the gastroduodenal artery and proximal supraduodenal branch in the region of the endoscopically placed clips. 2. No definite active arterial hemorrhage was identified during the angiogram. 3. The patient received an additional unit of blood in interventional radiology in addition to the 1 unit received in endoscopy for a total of 2 units during his procedural course this afternoon. 4. By the end the procedure, the patient had attained hemodynamic stability.  Signed,  Criselda Peaches, MD  [facility]  [facility]   Electronically Signed   By: Jacqulynn Cadet M.D.   On: 10/27/2013 17:07   Assessment & Plan by Problem: Principal Problem:   Acute blood loss anemia Active Problems:   Symptomatic anemia   Hypotension, unspecified   Hemorrhagic shock   Cancer of dome of urinary bladder  Symptomatic acute blood anemia 2/2 bleeding duodenal ulcer--in setting of recent NSAID use.  Initially seen by EGD by Dr. Benson Norway on  4/18, subsequently had urgent mesenteric angiogram and coil embolization of GDA by IR, Dr. Laurence Ferrari.   Transfused total of ~8 units PRBCs this admission.  He did receive 1 unit PRBC again this AM. Was hypotensive prior to procedures but pressures improved since procedures.  - post transfusion cbc pending, trend cbc q8h - monitor vitals closely, if hypotensive again, may need more IVF and monitor for more bleeding - appreciate GI and IR following - continue IV protonix BID and carafate - will start clears for now    Hemorrhagic shock 2/2 to active GIB--now resolved. -close monitoring of vitals, may need more blood or IVF with caution  Cancer of dome of urinary bladder  - Appreciate Urology following.  Seen kindly by Dr. Tresa Moore this morning and will need outpatient follow up likely with Dr. Janice Norrie.   - Outpatient follow up with Dr. Alen Blew, will need to continue active surveillance for known pulmonary nodules that have been noted to be suspicious for metastasis.   Diet: Clears DVT Ppx: SCDs Dispo: Disposition is deferred at this time, awaiting improvement of current medical problems. Anticipated discharge in approximately 2 day(s).   The patient does have a current PCP Redmond Pulling Arna Medici, MD) and does not need an [facility] hospital follow-up appointment after discharge.  The patient does not have transportation limitations that hinder transportation to clinic appointments.  Signed: Jerene Pitch, MD 10/28/2013, 1:23 PM

## 2013-10-28 NOTE — Progress Notes (Signed)
PULMONARY / CRITICAL CARE MEDICINE   Name: Omar [name] MRN: [id number] DOB: [birth year]    ADMISSION DATE:  10/26/2013 CONSULTATION DATE:  10/27/13  REFERRING MD :  GI PRIMARY SERVICE: GI   CHIEF COMPLAINT:  GIB   BRIEF PATIENT DESCRIPTION: 80 yo WM presented with melena and anemia underwent EGD with duodenal ulcer w/ active arterial bleed failed clipping, sent to IR for angio and embolization . PCCM consulted for ICU care .   SIGNIFICANT EVENTS / STUDIES:  EGD 4/18 duodenal ulcer w/ active bleed, clip placed IR messenteric angio /embolization  Transfused 7 U PRBC 4/18   LINES / TUBES: None   CULTURES: Urine culture 4/17 >>>   ANTIBIOTICS:  None  INTERVAL HISTORY:  3 bloody stools (dark red) overnight.  CBC trended down to 6.7 this AM.  Patient denies chest pain, dyspnea or other complaint.   VITAL SIGNS: Temp:  [97.5 F (36.4 C)-98.4 F (36.9 C)] 98.2 F (36.8 C) (04/19 0416) Pulse Rate:  [62-126] 95 (04/19 0700) Resp:  [8-32] 20 (04/19 0700) BP: (84-196)/(28-141) 116/52 mmHg (04/19 0700) SpO2:  [87 %-100 %] 100 % (04/19 0700)  HEMODYNAMICS:   VENTILATOR SETTINGS:  N/A   INTAKE / OUTPUT: Intake/Output     04/18 0701 - 04/19 0700 04/19 0701 - 04/20 0700   P.O.     I.V. (mL/kg) 1116.3 (15)    Blood     IV Piggyback     Total Intake(mL/kg) 1116.3 (15)    Urine (mL/kg/hr) 475 (0.3)    Stool     Total Output 475     Net +641.3          Stool Occurrence 3 x      PHYSICAL EXAMINATION: General:  Frail elderly male, NAD  Neuro:  Awake and alert, responding appropriately HEENT:  Dry mucosa  Cardiovascular:  RRR, +S1 +S2 Lungs: CTA  Abdomen:  Soft , NT, ND  Musculoskeletal:  Intact  Skin: small skin tear RUE (near antecubital fossa)  LABS:  CBC  Recent Labs Lab 10/27/13 1817 10/27/13 2222 10/28/13 0255  WBC 12.8* 14.9* 14.0*  HGB 7.9* 7.5* 7.2*  HCT 22.3* 21.5* 20.3*  PLT 127* 127* 131*   Coag's  Recent Labs Lab 10/26/13 2130  INR  1.19   BMET  Recent Labs Lab 10/26/13 0900 10/27/13 0910 10/27/13 2221  NA 140 142 144  K 5.2 4.5 4.1  CL 106 112 115*  CO2 22 20 20   BUN 71* 70* 65*  CREATININE 0.88 0.84 0.83  GLUCOSE 181* 169* 161*   Electrolytes  Recent Labs Lab 10/26/13 0900 10/27/13 0910 10/27/13 2221  CALCIUM 8.2* 7.2* 7.7*   Sepsis Markers  Recent Labs Lab 10/26/13 0918  LATICACIDVEN 2.05   Liver Enzymes  Recent Labs Lab 10/27/13 0910  AST 10  ALT <5  ALKPHOS 43  BILITOT 0.6  ALBUMIN 1.6*   Cardiac Enzymes  Recent Labs Lab 10/26/13 2130 10/27/13 0910  TROPONINI <0.30 <0.30   Glucose  Recent Labs Lab 10/26/13 1508 10/27/13 0745 10/27/13 1700 10/28/13 0740  GLUCAP 145* 167* 172* 147*    Imaging Ct Abdomen Pelvis Wo Contrast  10/27/2013   CLINICAL DATA:  Low hemoglobin.  Question intra-abdominal bleed.  EXAM: CT ABDOMEN AND PELVIS WITHOUT CONTRAST  TECHNIQUE: Multidetector CT imaging of the abdomen and pelvis was performed following the standard protocol without intravenous contrast.  COMPARISON:  CT ABD/PELVIS W CM dated 04/24/2013  FINDINGS: Numerous bilateral calcified pleural plaques. Mild cardiomegaly. No  confluent airspace opacities or effusions.  Small layering gallstones within the gallbladder. Liver, pancreas, spleen, adrenals and kidneys have an unremarkable unenhanced appearance. IVC filter is noted in the infrarenal IVC. Aortic and iliac calcifications without aneurysm.  There is prostate enlargement. There is beam hardening artifact from a left hip replacement which obscures lower pelvic structures. Previously seen large posterior bladder wall diverticulum is partially obscured by the beam hardening artifact. 9 mm calcification in the left side of the pelvis was shown on prior CT to be within this large diverticulum.  No evidence of retroperitoneal hematoma. No free fluid, free air or adenopathy. Stomach, large and small bowel are grossly unremarkable.  Diffuse  degenerative changes throughout the thoracolumbar spine. No acute bony abnormality.  IMPRESSION: No retroperitoneal or intra-abdominal hemorrhage.  Cholelithiasis.  Markedly enlarged prostate. Large posterior bladder wall diverticulum noted on prior CT partially obscured by left hip replacement.  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   Electronically Signed   By: Rolm Baptise M.D.   On: 10/27/2013 01:30   Dg Chest 2 View  10/26/2013   CLINICAL DATA:  Weakness  EXAM: CHEST  2 VIEW  COMPARISON:  DG CHEST 1 VIEW dated 04/30/2013  FINDINGS: Calcified pleural plaques are reidentified. Healed left clavicular fracture. Heart size is mildly enlarged without evidence for edema. No new pulmonary parenchymal opacity. No pleural effusion.  IMPRESSION: No new acute cardiopulmonary process.  Evidence of asbestos exposure reidentified.   Electronically Signed   By: Conchita Paris M.D.   On: 10/26/2013 09:36   Ir Angiogram Visceral Selective  10/27/2013   CLINICAL DATA:  80 year old male with acute anemia secondary to active upper GI bleed. On upper endoscopy he was found have not actively bleeding duodenum ulcer. A clip was placed but this was insufficient to control the hemorrhage. The patient was taken urgently to interventional Radiology for angiogram and embolization.  EXAM: SELECTIVE VISCERAL ARTERIOGRAPHY; ARTERIOGRAPHY; IR EMBO ART VEN HEMORR LYMPH EXTRAV INC GUIDE ROADMAPPING; IR ULTRASOUND GUIDANCE VASC ACCESS RIGHT  Date: 10/27/2013  PROCEDURE: 1. Ultrasound-guided puncture of the right common femoral artery 2. Catheterization of celiac artery with arteriogram 3. Catheterization of the common hepatic artery with arteriogram 4. Catheterization of the gastroduodenal artery with arteriogram 5. Catheterization of a super duodenum branch of the gastroduodenal artery with arteriogram 6. Coil embolization of the supra duodenal branch 7. Coil embolization of the  gastroduodenal artery 8. Catheterization of the superior mesenteric artery with arteriogram 9. Limited right common femoral arteriogram 10. Application of a Mynx percutaneous arterial closure device Interventional Radiologist:  Criselda Peaches, MD  ANESTHESIA/SEDATION: Moderate (conscious) sedation was used. 4 mg Versed, 50 mcg Fentanyl were administered intravenously. The patient's vital signs were monitored continuously by radiology nursing throughout the procedure.  Sedation Time: 75 minutes  FLUOROSCOPY TIME:  27 min 24 seconds  CONTRAST:  73mL OMNIPAQUE IOHEXOL 300 MG/ML  SOLN  TECHNIQUE: Informed consent was obtained from the patient following explanation of the procedure, risks, benefits and alternatives. The patient understands, agrees and consents for the procedure. All questions were addressed. A time out was performed.  Maximal barrier sterile technique utilized including caps, mask, sterile gowns, sterile gloves, large sterile drape, hand hygiene, and Betadine skin prep.  The right groin was interrogated with ultrasound. The common femoral artery was found to be widely patent. Local anesthesia was attained by infiltration with 1% lidocaine. A small dermatotomy was made. Under real-time sonographic guidance, the common femoral artery was  punctured with a 21 gauge micropuncture needle. Image was obtained and stored for the medical record. Using a transitional 5 French micro sheath, a Bentson wire was advanced of the abdominal aorta. A C2 Cobra catheter was advanced to the aorta. The celiac artery was selected and an arteriogram attempted. However, the C2 catheter achieved insufficient purchase. Therefore, the C2 catheter was exchanged for a Mickelson catheter. The Mickelson catheter was secured is slightly more secure early and celiac artery origin. The celiac arteriogram was performed. There is is relatively conventional hepatic arterial anatomy. The gastroduodenal artery is relatively identified.   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 A Renegade ST micro catheter was then navigated into the gastroduodenal artery. Gastroduodenal arteriogram was performed. No definite active hemorrhage is identified. There is a supraduodenal branch off the proximal GDA. This was catheterized and an arteriogram performed. Again, no active arterial bleeding. However, the vessels are in the immediate vicinity of the endoscopically placed clips. The decision was made to proceed with coil embolization of the supraduodenal arterial branch and gastroduodenal artery to decrease the pressure head and reduce the risk and severity of recurrent bleeding. The supraduodenal branch was coil embolized using first a 2 x 4 x 41 mm interlock diamond coil followed by a 3 x 120 mm interlock coil. The micro catheter was then advanced into the gastroduodenal artery at its junction with the right gastric artery. The vessel was then coil embolized using first a 6 x 100 mm interlock coil followed by a single 5 x 80 mm coil and two 5 x 150 mm coils. The vessel was embolized nearly to the origin. The micro catheter was pulled back into the common hepatic artery and a post embolization arteriogram was performed confirming no further flow into the GDA or supraduodenal branch vessels.  The micro catheter was removed. The Mickelson catheter was navigated in this SMA. A superior mesenteric arteriogram was performed. No definite vascularity in the region of the clips. No retrograde filling of the coil packs. The catheter was removed.  Through the access sheath, a limited right common femoral arteriogram was performed. The access is within the common femoral artery above the bifurcation below the inferior genu of the inferior epigastric artery. The groin was re-prepped with chlorhexidine. 2 g Ancef was administered intravenously. Arterial  hemostasis was achieved with the assistance of a Mynx Ace arterial closure device.  IMPRESSION: 1. Successful coil embolization of the gastroduodenal artery and proximal supraduodenal branch in the region of the endoscopically placed clips. 2. No definite active arterial hemorrhage was identified during the angiogram. 3. The patient received an additional unit of blood in interventional radiology in addition to the 1 unit received in endoscopy for a total of 2 units during his procedural course this afternoon. 4. By the end the procedure, the patient had attained hemodynamic stability.  Signed,  Criselda Peaches, MD  [facility]  [facility] Radiology   Electronically Signed   By: Jacqulynn Cadet M.D.   On: 10/27/2013 17:07   Ir Angiogram Visceral Selective  10/27/2013   CLINICAL DATA:  80 year old male with acute anemia secondary to active upper GI bleed. On upper endoscopy he was found have not actively bleeding duodenum ulcer. A clip was placed but this was insufficient to control the hemorrhage. The patient was taken urgently to interventional Radiology for angiogram and embolization.  EXAM: SELECTIVE  VISCERAL ARTERIOGRAPHY; ARTERIOGRAPHY; IR EMBO ART VEN HEMORR LYMPH EXTRAV INC GUIDE ROADMAPPING; IR ULTRASOUND GUIDANCE VASC ACCESS RIGHT  Date: 10/27/2013  PROCEDURE: 1. Ultrasound-guided puncture of the right common femoral artery 2. Catheterization of celiac artery with arteriogram 3. Catheterization of the common hepatic artery with arteriogram 4. Catheterization of the gastroduodenal artery with arteriogram 5. Catheterization of a super duodenum branch of the gastroduodenal artery with arteriogram 6. Coil embolization of the supra duodenal branch 7. Coil embolization of the gastroduodenal artery 8. Catheterization of the superior mesenteric artery with arteriogram 9. Limited right common femoral arteriogram 10. Application of a Mynx percutaneous arterial closure  device Interventional Radiologist:  Criselda Peaches, MD  ANESTHESIA/SEDATION: Moderate (conscious) sedation was used. 4 mg Versed, 50 mcg Fentanyl were administered intravenously. The patient's vital signs were monitored continuously by radiology nursing throughout the procedure.  Sedation Time: 75 minutes  FLUOROSCOPY TIME:  27 min 24 seconds  CONTRAST:  79mL OMNIPAQUE IOHEXOL 300 MG/ML  SOLN  TECHNIQUE: Informed consent was obtained from the patient following explanation of the procedure, risks, benefits and alternatives. The patient understands, agrees and consents for the procedure. All questions were addressed. A time out was performed.  Maximal barrier sterile technique utilized including caps, mask, sterile gowns, sterile gloves, large sterile drape, hand hygiene, and Betadine skin prep.  The right groin was interrogated with ultrasound. The common femoral artery was found to be widely patent. Local anesthesia was attained by infiltration with 1% lidocaine. A small dermatotomy was made. Under real-time sonographic guidance, the common femoral artery was punctured with a 21 gauge micropuncture needle. Image was obtained and stored for the medical record. Using a transitional 5 French micro sheath, a Bentson wire was advanced of the abdominal aorta. A C2 Cobra catheter was advanced to the aorta. The celiac artery was selected and an arteriogram attempted. However, the C2 catheter achieved insufficient purchase. Therefore, the C2 catheter was exchanged for a Mickelson catheter. The Mickelson catheter was secured is slightly more secure early and celiac artery origin. The celiac arteriogram was performed. There is is relatively conventional hepatic arterial anatomy. The gastroduodenal artery is relatively identified.  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 A Renegade ST micro catheter was then  navigated into the gastroduodenal artery. Gastroduodenal arteriogram was performed. No definite active hemorrhage is identified. There is a supraduodenal branch off the proximal GDA. This was catheterized and an arteriogram performed. Again, no active arterial bleeding. However, the vessels are in the immediate vicinity of the endoscopically placed clips. The decision was made to proceed with coil embolization of the supraduodenal arterial branch and gastroduodenal artery to decrease the pressure head and reduce the risk and severity of recurrent bleeding. The supraduodenal branch was coil embolized using first a 2 x 4 x 41 mm interlock diamond coil followed by a 3 x 120 mm interlock coil. The micro catheter was then advanced into the gastroduodenal artery at its junction with the right gastric artery. The vessel was then coil embolized using first a 6 x 100 mm interlock coil followed by a single 5 x 80 mm coil and two 5 x 150 mm coils. The vessel was embolized nearly to the origin. The micro catheter was pulled back into the common hepatic artery and a post embolization arteriogram was performed confirming no further flow into the GDA or supraduodenal branch vessels.  The micro catheter was  removed. The Mickelson catheter was navigated in this SMA. A superior mesenteric arteriogram was performed. No definite vascularity in the region of the clips. No retrograde filling of the coil packs. The catheter was removed.  Through the access sheath, a limited right common femoral arteriogram was performed. The access is within the common femoral artery above the bifurcation below the inferior genu of the inferior epigastric artery. The groin was re-prepped with chlorhexidine. 2 g Ancef was administered intravenously. Arterial hemostasis was achieved with the assistance of a Mynx Ace arterial closure device.  IMPRESSION: 1. Successful coil embolization of the gastroduodenal artery and proximal supraduodenal branch in the  region of the endoscopically placed clips. 2. No definite active arterial hemorrhage was identified during the angiogram. 3. The patient received an additional unit of blood in interventional radiology in addition to the 1 unit received in endoscopy for a total of 2 units during his procedural course this afternoon. 4. By the end the procedure, the patient had attained hemodynamic stability.  Signed,  Criselda Peaches, MD  [facility]  [facility] Radiology   Electronically Signed   By: Jacqulynn Cadet M.D.   On: 10/27/2013 17:07   Ir Angiogram Follow Up Study  10/27/2013   CLINICAL DATA:  80 year old male with acute anemia secondary to active upper GI bleed. On upper endoscopy he was found have not actively bleeding duodenum ulcer. A clip was placed but this was insufficient to control the hemorrhage. The patient was taken urgently to interventional Radiology for angiogram and embolization.  EXAM: SELECTIVE VISCERAL ARTERIOGRAPHY; ARTERIOGRAPHY; IR EMBO ART VEN HEMORR LYMPH EXTRAV INC GUIDE ROADMAPPING; IR ULTRASOUND GUIDANCE VASC ACCESS RIGHT  Date: 10/27/2013  PROCEDURE: 1. Ultrasound-guided puncture of the right common femoral artery 2. Catheterization of celiac artery with arteriogram 3. Catheterization of the common hepatic artery with arteriogram 4. Catheterization of the gastroduodenal artery with arteriogram 5. Catheterization of a super duodenum branch of the gastroduodenal artery with arteriogram 6. Coil embolization of the supra duodenal branch 7. Coil embolization of the gastroduodenal artery 8. Catheterization of the superior mesenteric artery with arteriogram 9. Limited right common femoral arteriogram 10. Application of a Mynx percutaneous arterial closure device Interventional Radiologist:  Criselda Peaches, MD  ANESTHESIA/SEDATION: Moderate (conscious) sedation was used. 4 mg Versed, 50 mcg Fentanyl were administered intravenously. The patient's vital  signs were monitored continuously by radiology nursing throughout the procedure.  Sedation Time: 75 minutes  FLUOROSCOPY TIME:  27 min 24 seconds  CONTRAST:  14mL OMNIPAQUE IOHEXOL 300 MG/ML  SOLN  TECHNIQUE: Informed consent was obtained from the patient following explanation of the procedure, risks, benefits and alternatives. The patient understands, agrees and consents for the procedure. All questions were addressed. A time out was performed.  Maximal barrier sterile technique utilized including caps, mask, sterile gowns, sterile gloves, large sterile drape, hand hygiene, and Betadine skin prep.  The right groin was interrogated with ultrasound. The common femoral artery was found to be widely patent. Local anesthesia was attained by infiltration with 1% lidocaine. A small dermatotomy was made. Under real-time sonographic guidance, the common femoral artery was punctured with a 21 gauge micropuncture needle. Image was obtained and stored for the medical record. Using a transitional 5 French micro sheath, a Bentson wire was advanced of the abdominal aorta. A C2 Cobra catheter was advanced to the aorta. The celiac artery was selected and an arteriogram attempted. However, the C2 catheter achieved insufficient purchase. Therefore, the C2 catheter was exchanged  for a Mickelson catheter. The Mickelson catheter was secured is slightly more secure early and celiac artery origin. The celiac arteriogram was performed. There is is relatively conventional hepatic arterial anatomy. The gastroduodenal artery is relatively identified.  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 A Renegade ST micro catheter was then navigated into the gastroduodenal artery. Gastroduodenal arteriogram was performed. No definite active hemorrhage is identified. There is a supraduodenal branch off the proximal GDA. This was  catheterized and an arteriogram performed. Again, no active arterial bleeding. However, the vessels are in the immediate vicinity of the endoscopically placed clips. The decision was made to proceed with coil embolization of the supraduodenal arterial branch and gastroduodenal artery to decrease the pressure head and reduce the risk and severity of recurrent bleeding. The supraduodenal branch was coil embolized using first a 2 x 4 x 41 mm interlock diamond coil followed by a 3 x 120 mm interlock coil. The micro catheter was then advanced into the gastroduodenal artery at its junction with the right gastric artery. The vessel was then coil embolized using first a 6 x 100 mm interlock coil followed by a single 5 x 80 mm coil and two 5 x 150 mm coils. The vessel was embolized nearly to the origin. The micro catheter was pulled back into the common hepatic artery and a post embolization arteriogram was performed confirming no further flow into the GDA or supraduodenal branch vessels.  The micro catheter was removed. The Mickelson catheter was navigated in this SMA. A superior mesenteric arteriogram was performed. No definite vascularity in the region of the clips. No retrograde filling of the coil packs. The catheter was removed.  Through the access sheath, a limited right common femoral arteriogram was performed. The access is within the common femoral artery above the bifurcation below the inferior genu of the inferior epigastric artery. The groin was re-prepped with chlorhexidine. 2 g Ancef was administered intravenously. Arterial hemostasis was achieved with the assistance of a Mynx Ace arterial closure device.  IMPRESSION: 1. Successful coil embolization of the gastroduodenal artery and proximal supraduodenal branch in the region of the endoscopically placed clips. 2. No definite active arterial hemorrhage was identified during the angiogram. 3. The patient received an additional unit of blood in interventional  radiology in addition to the 1 unit received in endoscopy for a total of 2 units during his procedural course this afternoon. 4. By the end the procedure, the patient had attained hemodynamic stability.  Signed,  Criselda Peaches, MD  [facility]  [facility] Radiology   Electronically Signed   By: Jacqulynn Cadet M.D.   On: 10/27/2013 17:07   Ir US Guide Vasc Access Right  10/27/2013   CLINICAL DATA:  80 year old male with acute anemia secondary to active upper GI bleed. On upper endoscopy he was found have not actively bleeding duodenum ulcer. A clip was placed but this was insufficient to control the hemorrhage. The patient was taken urgently to interventional Radiology for angiogram and embolization.  EXAM: SELECTIVE VISCERAL ARTERIOGRAPHY; ARTERIOGRAPHY; IR EMBO ART VEN HEMORR LYMPH EXTRAV INC GUIDE ROADMAPPING; IR ULTRASOUND GUIDANCE VASC ACCESS RIGHT  Date: 10/27/2013  PROCEDURE: 1. Ultrasound-guided puncture of the right common femoral artery 2. Catheterization of celiac artery with arteriogram 3. Catheterization of the common hepatic artery with arteriogram 4. Catheterization of the gastroduodenal artery with arteriogram 5. Catheterization of a  super duodenum branch of the gastroduodenal artery with arteriogram 6. Coil embolization of the supra duodenal branch 7. Coil embolization of the gastroduodenal artery 8. Catheterization of the superior mesenteric artery with arteriogram 9. Limited right common femoral arteriogram 10. Application of a Mynx percutaneous arterial closure device Interventional Radiologist:  Criselda Peaches, MD  ANESTHESIA/SEDATION: Moderate (conscious) sedation was used. 4 mg Versed, 50 mcg Fentanyl were administered intravenously. The patient's vital signs were monitored continuously by radiology nursing throughout the procedure.  Sedation Time: 75 minutes  FLUOROSCOPY TIME:  27 min 24 seconds  CONTRAST:  7mL OMNIPAQUE IOHEXOL 300 MG/ML   SOLN  TECHNIQUE: Informed consent was obtained from the patient following explanation of the procedure, risks, benefits and alternatives. The patient understands, agrees and consents for the procedure. All questions were addressed. A time out was performed.  Maximal barrier sterile technique utilized including caps, mask, sterile gowns, sterile gloves, large sterile drape, hand hygiene, and Betadine skin prep.  The right groin was interrogated with ultrasound. The common femoral artery was found to be widely patent. Local anesthesia was attained by infiltration with 1% lidocaine. A small dermatotomy was made. Under real-time sonographic guidance, the common femoral artery was punctured with a 21 gauge micropuncture needle. Image was obtained and stored for the medical record. Using a transitional 5 French micro sheath, a Bentson wire was advanced of the abdominal aorta. A C2 Cobra catheter was advanced to the aorta. The celiac artery was selected and an arteriogram attempted. However, the C2 catheter achieved insufficient purchase. Therefore, the C2 catheter was exchanged for a Mickelson catheter. The Mickelson catheter was secured is slightly more secure early and celiac artery origin. The celiac arteriogram was performed. There is is relatively conventional hepatic arterial anatomy. The gastroduodenal artery is relatively identified.  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 A Renegade ST micro catheter was then navigated into the gastroduodenal artery. Gastroduodenal arteriogram was performed. No definite active hemorrhage is identified. There is a supraduodenal branch off the proximal GDA. This was catheterized and an arteriogram performed. Again, no active arterial bleeding. However, the vessels are in the immediate vicinity of the endoscopically placed clips. The decision was made to proceed  with coil embolization of the supraduodenal arterial branch and gastroduodenal artery to decrease the pressure head and reduce the risk and severity of recurrent bleeding. The supraduodenal branch was coil embolized using first a 2 x 4 x 41 mm interlock diamond coil followed by a 3 x 120 mm interlock coil. The micro catheter was then advanced into the gastroduodenal artery at its junction with the right gastric artery. The vessel was then coil embolized using first a 6 x 100 mm interlock coil followed by a single 5 x 80 mm coil and two 5 x 150 mm coils. The vessel was embolized nearly to the origin. The micro catheter was pulled back into the common hepatic artery and a post embolization arteriogram was performed confirming no further flow into the GDA or supraduodenal branch vessels.  The micro catheter was removed. The Mickelson catheter was navigated in this SMA. A superior mesenteric arteriogram was performed. No definite vascularity in the region of the clips. No retrograde filling of the coil packs. The catheter was removed.  Through the access sheath, a limited right common femoral arteriogram was performed. The access is within the common femoral artery above the bifurcation below the  inferior genu of the inferior epigastric artery. The groin was re-prepped with chlorhexidine. 2 g Ancef was administered intravenously. Arterial hemostasis was achieved with the assistance of a Mynx Ace arterial closure device.  IMPRESSION: 1. Successful coil embolization of the gastroduodenal artery and proximal supraduodenal branch in the region of the endoscopically placed clips. 2. No definite active arterial hemorrhage was identified during the angiogram. 3. The patient received an additional unit of blood in interventional radiology in addition to the 1 unit received in endoscopy for a total of 2 units during his procedural course this afternoon. 4. By the end the procedure, the patient had attained hemodynamic stability.   Signed,  Criselda Peaches, MD  [facility]  [facility]   Electronically Signed   By: Jacqulynn Cadet M.D.   On: 10/27/2013 17:07   Dg Chest Port 1 View  10/27/2013   CLINICAL DATA:  Duodenal ulcer coiling. Postprocedure. Asbestos exposure. A risk for aspiration.  EXAM: PORTABLE CHEST - 1 VIEW  COMPARISON:  10/26/2013  FINDINGS: Artifact degradation over the lung apices. Midline trachea. Moderate cardiomegaly. Left greater than right calcified pleural plaques. No pneumothorax. Mild volume loss at the medial left lung base. Diffuse peribronchial thickening.  IMPRESSION: Mild opacity at the medial left lung base.  Favor atelectasis.  Cardiomegaly without congestive failure.  Asbestos related pleural disease.   Electronically Signed   By: Abigail Miyamoto M.D.   On: 10/27/2013 19:10   [place] Guide Roadmapping  10/27/2013   CLINICAL DATA:  80 year old male with acute anemia secondary to active upper GI bleed. On upper endoscopy he was found have not actively bleeding duodenum ulcer. A clip was placed but this was insufficient to control the hemorrhage. The patient was taken urgently to interventional Radiology for angiogram and embolization.  EXAM: SELECTIVE VISCERAL ARTERIOGRAPHY; ARTERIOGRAPHY; IR EMBO ART VEN HEMORR LYMPH EXTRAV INC GUIDE ROADMAPPING; IR ULTRASOUND GUIDANCE VASC ACCESS RIGHT  Date: 10/27/2013  PROCEDURE: 1. Ultrasound-guided puncture of the right common femoral artery 2. Catheterization of celiac artery with arteriogram 3. Catheterization of the common hepatic artery with arteriogram 4. Catheterization of the gastroduodenal artery with arteriogram 5. Catheterization of a super duodenum branch of the gastroduodenal artery with arteriogram 6. Coil embolization of the supra duodenal branch 7. Coil embolization of the gastroduodenal artery 8. Catheterization of the superior mesenteric artery with arteriogram 9. Limited  right common femoral arteriogram 10. Application of a Mynx percutaneous arterial closure device Interventional Radiologist:  Criselda Peaches, MD  ANESTHESIA/SEDATION: Moderate (conscious) sedation was used. 4 mg Versed, 50 mcg Fentanyl were administered intravenously. The patient's vital signs were monitored continuously by radiology nursing throughout the procedure.  Sedation Time: 75 minutes  FLUOROSCOPY TIME:  27 min 24 seconds  CONTRAST:  52mL OMNIPAQUE IOHEXOL 300 MG/ML  SOLN  TECHNIQUE: Informed consent was obtained from the patient following explanation of the procedure, risks, benefits and alternatives. The patient understands, agrees and consents for the procedure. All questions were addressed. A time out was performed.  Maximal barrier sterile technique utilized including caps, mask, sterile gowns, sterile gloves, large sterile drape, hand hygiene, and Betadine skin prep.  The right groin was interrogated with ultrasound. The common femoral artery was found to be widely patent. Local anesthesia was attained by infiltration with 1% lidocaine. A small dermatotomy was made. Under real-time sonographic guidance, the common femoral artery was punctured with a 21 gauge micropuncture needle. Image was obtained  and stored for the medical record. Using a transitional 5 French micro sheath, a Bentson wire was advanced of the abdominal aorta. A C2 Cobra catheter was advanced to the aorta. The celiac artery was selected and an arteriogram attempted. However, the C2 catheter achieved insufficient purchase. Therefore, the C2 catheter was exchanged for a Mickelson catheter. The Mickelson catheter was secured is slightly more secure early and celiac artery origin. The celiac arteriogram was performed. There is is relatively conventional hepatic arterial anatomy. The gastroduodenal artery is relatively identified.  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 A Renegade ST micro catheter was then navigated into the gastroduodenal artery. Gastroduodenal arteriogram was performed. No definite active hemorrhage is identified. There is a supraduodenal branch off the proximal GDA. This was catheterized and an arteriogram performed. Again, no active arterial bleeding. However, the vessels are in the immediate vicinity of the endoscopically placed clips. The decision was made to proceed with coil embolization of the supraduodenal arterial branch and gastroduodenal artery to decrease the pressure head and reduce the risk and severity of recurrent bleeding. The supraduodenal branch was coil embolized using first a 2 x 4 x 41 mm interlock diamond coil followed by a 3 x 120 mm interlock coil. The micro catheter was then advanced into the gastroduodenal artery at its junction with the right gastric artery. The vessel was then coil embolized using first a 6 x 100 mm interlock coil followed by a single 5 x 80 mm coil and two 5 x 150 mm coils. The vessel was embolized nearly to the origin. The micro catheter was pulled back into the common hepatic artery and a post embolization arteriogram was performed confirming no further flow into the GDA or supraduodenal branch vessels.  The micro catheter was removed. The Mickelson catheter was navigated in this SMA. A superior mesenteric arteriogram was performed. No definite vascularity in the region of the clips. No retrograde filling of the coil packs. The catheter was removed.  Through the access sheath, a limited right common femoral arteriogram was performed. The access is within the common femoral artery above the bifurcation below the inferior genu of the inferior epigastric artery. The groin was re-prepped with chlorhexidine. 2 g Ancef was administered intravenously. Arterial hemostasis was achieved with the assistance of a Mynx Ace arterial closure device.  IMPRESSION: 1.  Successful coil embolization of the gastroduodenal artery and proximal supraduodenal branch in the region of the endoscopically placed clips. 2. No definite active arterial hemorrhage was identified during the angiogram. 3. The patient received an additional unit of blood in interventional radiology in addition to the 1 unit received in endoscopy for a total of 2 units during his procedural course this afternoon. 4. By the end the procedure, the patient had attained hemodynamic stability.  Signed,  Criselda Peaches, MD  [facility]  [facility]   Electronically Signed   By: Jacqulynn Cadet M.D.   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, asbestosis P:   O2 for sat >90%  May need lasix with more tx  CARDIOVASCULAR A: Hemorrhagic Shock/Hypotension, improved with fluids/transfusion  P:  Continue IVF to kvo, see renal Hold home Lasix  Keep large bore IV CBC q8h   RENAL A:  Renal insufficiency, at risk for ATN and hyperkalemia s/p  8 units, Cr and K currently stable P:   Monitor BMP Monitor I&Os Resuscitation with products NOT cyrstalloid  GASTROINTESTINAL A:  GIB with duodenal Ulcer w/ active bleed s/p embolization in IR  P:   GI following  PPI  NPO  Start Carafate See heme  HEMATOLOGIC/ONCOLOGIC A:  Anemia - acute blood loss, high risk of consumptive coagulapthy, coags normal      Bladder CA - s/p TURB and XRT; under surveillance, followed by Dr. Alen Blew (Onc) and Dr. Janice Norrie (Urology) P:  Follow CBC q8h after 1 more unit Transfuse if < 7 SCDs  INFECTIOUS A:  No apparent infection  P:   Monitor  Trend temp/WBC   ENDOCRINE A:  No acute abnormality P:   Add SSI if BG >180   NEUROLOGIC A:  No acute abnormality P:   Monitor  Avoid benzos  Duwaine Maxin, DO IMTS, PGY1 10/28/2013, 7:51 AM  I have fully examined this patient and agree with above findings.    To sdu, to  [place]. Titus Mould, MD, [place] Pgr: [facility]

## 2013-10-29 ENCOUNTER — Encounter (HOSPITAL_COMMUNITY): Payer: Self-pay | Admitting: Gastroenterology

## 2013-10-29 DIAGNOSIS — K264 Chronic or unspecified duodenal ulcer with hemorrhage: Secondary | ICD-10-CM

## 2013-10-29 LAB — CBC
HCT: 19.6 % — ABNORMAL LOW (ref 39.0–52.0)
HEMATOCRIT: 21.3 % — AB (ref 39.0–52.0)
HEMATOCRIT: 21.5 % — AB (ref 39.0–52.0)
HEMOGLOBIN: 7.5 g/dL — AB (ref 13.0–17.0)
Hemoglobin: 6.8 g/dL — CL (ref 13.0–17.0)
Hemoglobin: 7.5 g/dL — ABNORMAL LOW (ref 13.0–17.0)
MCH: 29.1 pg (ref 26.0–34.0)
MCH: 29.8 pg (ref 26.0–34.0)
MCH: 29.6 pg (ref 26.0–34.0)
MCHC: 34.7 g/dL (ref 30.0–36.0)
MCHC: 34.9 g/dL (ref 30.0–36.0)
MCHC: 35.2 g/dL (ref 30.0–36.0)
MCV: 83.8 fL (ref 78.0–100.0)
MCV: 84.2 fL (ref 78.0–100.0)
MCV: 85.3 fL (ref 78.0–100.0)
Platelets: 122 10*3/uL — ABNORMAL LOW (ref 150–400)
Platelets: 130 10*3/uL — ABNORMAL LOW (ref 150–400)
Platelets: 134 10*3/uL — ABNORMAL LOW (ref 150–400)
RBC: 2.34 MIL/uL — AB (ref 4.22–5.81)
RBC: 2.52 MIL/uL — ABNORMAL LOW (ref 4.22–5.81)
RBC: 2.53 MIL/uL — AB (ref 4.22–5.81)
RDW: 14.4 % (ref 11.5–15.5)
RDW: 15 % (ref 11.5–15.5)
RDW: 15 % (ref 11.5–15.5)
WBC: 10.7 10*3/uL — ABNORMAL HIGH (ref 4.0–10.5)
WBC: 8.9 10*3/uL (ref 4.0–10.5)
WBC: 9 10*3/uL (ref 4.0–10.5)

## 2013-10-29 LAB — PROTIME-INR
INR: 1.24 (ref 0.00–1.49)
Prothrombin Time: 15.3 seconds — ABNORMAL HIGH (ref 11.6–15.2)

## 2013-10-29 LAB — GLUCOSE, CAPILLARY: Glucose-Capillary: 113 mg/dL — ABNORMAL HIGH (ref 70–99)

## 2013-10-29 MED ORDER — MORPHINE SULFATE 2 MG/ML IJ SOLN
2.0000 mg | Freq: Four times a day (QID) | INTRAMUSCULAR | Status: DC | PRN
  Administered 2013-10-29: 2 mg via INTRAVENOUS
  Filled 2013-10-29: qty 1

## 2013-10-29 MED ORDER — DEXTROSE-NACL 5-0.45 % IV SOLN
INTRAVENOUS | Status: AC
  Administered 2013-10-29 (×2): via INTRAVENOUS

## 2013-10-29 NOTE — Progress Notes (Signed)
  Date: 10/29/2013  Patient name: Omar [name]  Medical record number: [id number]  Date of birth: May 21, 1916   This patient has been seen and the plan of care was discussed with the house staff. Please see their note for complete details. I concur with their findings with the following additions/corrections: Events overnight noted. Required additional unit of PRBCs. He has required a total of 9 units of PRBCs. Follow Ca, coags. Continue to follow H/H as you are doing. GI is following. Agree with transfer to tele in light of hemodynamic stability.  Dominic Pea, DO, [facility] 10/29/2013, 11:04 AM

## 2013-10-29 NOTE — Progress Notes (Signed)
10/29/2013 Central telemetry called patient was having junction rhythm around 1530, heart rate was in the mid 50's. MD was notified and orders was given to do 12 Lead EKG. Continue to monitor patient. [facility] RN/.

## 2013-10-29 NOTE — Progress Notes (Signed)
eLink Physician-Brief Progress Note Patient Name: Omar [name] DOB: [birth year] MRN: [id number]  Date of Service  10/29/2013   HPI/Events of Note  Patient with GI bleed.  Hgb 6.8 down from 7.1.  VSS. Felt to be passing old blood but no clear active bleed Also c/o hip pain.   eICU Interventions  Transfuse 1 unit PRBC Check coag panel Morphine for pain.      Omar [name] 10/29/2013, 12:40 AM

## 2013-10-29 NOTE — Progress Notes (Signed)
Subjective: Patient with duodenal bleeding ulcer s/p GDA embolization 4/18. Per RN (2) melanous stools 4/19, no BM today. The patient denies any abdominal pain or right groin site pain today.   Objective: Physical Exam: BP 95/57  Pulse 66  Temp(Src) 98.6 F (37 C) (Axillary)  Resp 18  Ht 5\' 10"  (1.778 m)  Wt 162 lb 14.7 oz (73.9 kg)  BMI 23.38 kg/m2  SpO2 100%  General: awake, alert, NAD Abd: Soft, NT, ND, (+) BS Ext: Right CFA site C/D/I, soft, no signs of bleeding or hematoma, DP intact bilaterally.  Labs: CBC  Recent Labs  10/28/13 2350 10/29/13 0742  WBC 10.7* 8.9  HGB 6.8* 7.5*  HCT 19.6* 21.3*  PLT 134* 122*   BMET  Recent Labs  10/27/13 2221 10/28/13 1410  NA 144 145  K 4.1 3.7  CL 115* 117*  CO2 20 18*  GLUCOSE 161* 137*  BUN 65* 61*  CREATININE 0.83 0.77  CALCIUM 7.7* 7.6*   LFT  Recent Labs  10/27/13 0910  PROT 3.6*  ALBUMIN 1.6*  AST 10  ALT <5  ALKPHOS 43  BILITOT 0.6   PT/INR  Recent Labs  10/26/13 2130 10/29/13 0125  LABPROT 14.8 15.3*  INR 1.19 1.24     Studies/Results: Ir Angiogram Visceral Selective  10/27/2013   CLINICAL DATA:  80 year old male with acute anemia secondary to active upper GI bleed. On upper endoscopy he was found have not actively bleeding duodenum ulcer. A clip was placed but this was insufficient to control the hemorrhage. The patient was taken urgently to interventional Radiology for angiogram and embolization.  EXAM: SELECTIVE VISCERAL ARTERIOGRAPHY; ARTERIOGRAPHY; IR EMBO ART VEN HEMORR LYMPH EXTRAV INC GUIDE ROADMAPPING; IR ULTRASOUND GUIDANCE VASC ACCESS RIGHT  Date: 10/27/2013  PROCEDURE: 1. Ultrasound-guided puncture of the right common femoral artery 2. Catheterization of celiac artery with arteriogram 3. Catheterization of the common hepatic artery with arteriogram 4. Catheterization of the gastroduodenal artery with arteriogram 5. Catheterization of a super duodenum branch of the gastroduodenal artery  with arteriogram 6. Coil embolization of the supra duodenal branch 7. Coil embolization of the gastroduodenal artery 8. Catheterization of the superior mesenteric artery with arteriogram 9. Limited right common femoral arteriogram 10. Application of a Mynx percutaneous arterial closure device Interventional Radiologist:  Criselda Peaches, MD  ANESTHESIA/SEDATION: Moderate (conscious) sedation was used. 4 mg Versed, 50 mcg Fentanyl were administered intravenously. The patient's vital signs were monitored continuously by radiology nursing throughout the procedure.  Sedation Time: 75 minutes  FLUOROSCOPY TIME:  27 min 24 seconds  CONTRAST:  52mL OMNIPAQUE IOHEXOL 300 MG/ML  SOLN  TECHNIQUE: Informed consent was obtained from the patient following explanation of the procedure, risks, benefits and alternatives. The patient understands, agrees and consents for the procedure. All questions were addressed. A time out was performed.  Maximal barrier sterile technique utilized including caps, mask, sterile gowns, sterile gloves, large sterile drape, hand hygiene, and Betadine skin prep.  The right groin was interrogated with ultrasound. The common femoral artery was found to be widely patent. Local anesthesia was attained by infiltration with 1% lidocaine. A small dermatotomy was made. Under real-time sonographic guidance, the common femoral artery was punctured with a 21 gauge micropuncture needle. Image was obtained and stored for the medical record. Using a transitional 5 French micro sheath, a Bentson wire was advanced of the abdominal aorta. A C2 Cobra catheter was advanced to the aorta. The celiac artery was selected and an arteriogram attempted. However, the  C2 catheter achieved insufficient purchase. Therefore, the C2 catheter was exchanged for a Mickelson catheter. The Mickelson catheter was secured is slightly more secure early and celiac artery origin. The celiac arteriogram was performed. There is is relatively  conventional hepatic arterial anatomy. The gastroduodenal artery is relatively identified.  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 A Renegade ST micro catheter was then navigated into the gastroduodenal artery. Gastroduodenal arteriogram was performed. No definite active hemorrhage is identified. There is a supraduodenal branch off the proximal GDA. This was catheterized and an arteriogram performed. Again, no active arterial bleeding. However, the vessels are in the immediate vicinity of the endoscopically placed clips. The decision was made to proceed with coil embolization of the supraduodenal arterial branch and gastroduodenal artery to decrease the pressure head and reduce the risk and severity of recurrent bleeding. The supraduodenal branch was coil embolized using first a 2 x 4 x 41 mm interlock diamond coil followed by a 3 x 120 mm interlock coil. The micro catheter was then advanced into the gastroduodenal artery at its junction with the right gastric artery. The vessel was then coil embolized using first a 6 x 100 mm interlock coil followed by a single 5 x 80 mm coil and two 5 x 150 mm coils. The vessel was embolized nearly to the origin. The micro catheter was pulled back into the common hepatic artery and a post embolization arteriogram was performed confirming no further flow into the GDA or supraduodenal branch vessels.  The micro catheter was removed. The Mickelson catheter was navigated in this SMA. A superior mesenteric arteriogram was performed. No definite vascularity in the region of the clips. No retrograde filling of the coil packs. The catheter was removed.  Through the access sheath, a limited right common femoral arteriogram was performed. The access is within the common femoral artery above the bifurcation below the inferior genu of the inferior epigastric artery. The groin  was re-prepped with chlorhexidine. 2 g Ancef was administered intravenously. Arterial hemostasis was achieved with the assistance of a Mynx Ace arterial closure device.  IMPRESSION: 1. Successful coil embolization of the gastroduodenal artery and proximal supraduodenal branch in the region of the endoscopically placed clips. 2. No definite active arterial hemorrhage was identified during the angiogram. 3. The patient received an additional unit of blood in interventional radiology in addition to the 1 unit received in endoscopy for a total of 2 units during his procedural course this afternoon. 4. By the end the procedure, the patient had attained hemodynamic stability.  Signed,  Criselda Peaches, MD  [facility]  [facility]iology   Electronically Signed   By: Jacqulynn Cadet M.D.   On: 10/27/2013 17:07   Ir Angiogram Visceral Selective  10/27/2013   CLINICAL DATA:  80 year old male with acute anemia secondary to active upper GI bleed. On upper endoscopy he was found have not actively bleeding duodenum ulcer. A clip was placed but this was insufficient to control the hemorrhage. The patient was taken urgently to interventional Radiology for angiogram and embolization.  EXAM: SELECTIVE VISCERAL ARTERIOGRAPHY; ARTERIOGRAPHY; IR EMBO ART VEN HEMORR LYMPH EXTRAV INC GUIDE ROADMAPPING; IR ULTRASOUND GUIDANCE VASC ACCESS RIGHT  Date: 10/27/2013  PROCEDURE: 1. Ultrasound-guided puncture of the right common femoral artery 2. Catheterization of celiac artery with arteriogram 3. Catheterization of the common hepatic artery with arteriogram 4. Catheterization of  the gastroduodenal artery with arteriogram 5. Catheterization of a super duodenum branch of the gastroduodenal artery with arteriogram 6. Coil embolization of the supra duodenal branch 7. Coil embolization of the gastroduodenal artery 8. Catheterization of the superior mesenteric artery with arteriogram 9. Limited right  common femoral arteriogram 10. Application of a Mynx percutaneous arterial closure device Interventional Radiologist:  Criselda Peaches, MD  ANESTHESIA/SEDATION: Moderate (conscious) sedation was used. 4 mg Versed, 50 mcg Fentanyl were administered intravenously. The patient's vital signs were monitored continuously by radiology nursing throughout the procedure.  Sedation Time: 75 minutes  FLUOROSCOPY TIME:  27 min 24 seconds  CONTRAST:  70mL OMNIPAQUE IOHEXOL 300 MG/ML  SOLN  TECHNIQUE: Informed consent was obtained from the patient following explanation of the procedure, risks, benefits and alternatives. The patient understands, agrees and consents for the procedure. All questions were addressed. A time out was performed.  Maximal barrier sterile technique utilized including caps, mask, sterile gowns, sterile gloves, large sterile drape, hand hygiene, and Betadine skin prep.  The right groin was interrogated with ultrasound. The common femoral artery was found to be widely patent. Local anesthesia was attained by infiltration with 1% lidocaine. A small dermatotomy was made. Under real-time sonographic guidance, the common femoral artery was punctured with a 21 gauge micropuncture needle. Image was obtained and stored for the medical record. Using a transitional 5 French micro sheath, a Bentson wire was advanced of the abdominal aorta. A C2 Cobra catheter was advanced to the aorta. The celiac artery was selected and an arteriogram attempted. However, the C2 catheter achieved insufficient purchase. Therefore, the C2 catheter was exchanged for a Mickelson catheter. The Mickelson catheter was secured is slightly more secure early and celiac artery origin. The celiac arteriogram was performed. There is is relatively conventional hepatic arterial anatomy. The gastroduodenal artery is relatively identified.  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 A Renegade ST micro catheter was then navigated into the gastroduodenal artery. Gastroduodenal arteriogram was performed. No definite active hemorrhage is identified. There is a supraduodenal branch off the proximal GDA. This was catheterized and an arteriogram performed. Again, no active arterial bleeding. However, the vessels are in the immediate vicinity of the endoscopically placed clips. The decision was made to proceed with coil embolization of the supraduodenal arterial branch and gastroduodenal artery to decrease the pressure head and reduce the risk and severity of recurrent bleeding. The supraduodenal branch was coil embolized using first a 2 x 4 x 41 mm interlock diamond coil followed by a 3 x 120 mm interlock coil. The micro catheter was then advanced into the gastroduodenal artery at its junction with the right gastric artery. The vessel was then coil embolized using first a 6 x 100 mm interlock coil followed by a single 5 x 80 mm coil and two 5 x 150 mm coils. The vessel was embolized nearly to the origin. The micro catheter was pulled back into the common hepatic artery and a post embolization arteriogram was performed confirming no further flow into the GDA or supraduodenal branch vessels.  The micro catheter was removed. The Mickelson catheter was navigated in this SMA. A superior mesenteric arteriogram was performed. No definite vascularity in the region of the clips. No retrograde filling of the coil packs. The catheter was removed.  Through the access sheath, a limited right common femoral arteriogram was performed. The access is within  the common femoral artery above the bifurcation below the inferior genu of the inferior epigastric artery. The groin was re-prepped with chlorhexidine. 2 g Ancef was administered intravenously. Arterial hemostasis was achieved with the assistance of a Mynx Ace arterial closure device.  IMPRESSION: 1.  Successful coil embolization of the gastroduodenal artery and proximal supraduodenal branch in the region of the endoscopically placed clips. 2. No definite active arterial hemorrhage was identified during the angiogram. 3. The patient received an additional unit of blood in interventional radiology in addition to the 1 unit received in endoscopy for a total of 2 units during his procedural course this afternoon. 4. By the end the procedure, the patient had attained hemodynamic stability.  Signed,  Criselda Peaches, MD  [facility]  [facility] Radiology   Electronically Signed   By: Jacqulynn Cadet M.D.   On: 10/27/2013 17:07   Ir Angiogram Follow Up Study  10/27/2013   CLINICAL DATA:  80 year old male with acute anemia secondary to active upper GI bleed. On upper endoscopy he was found have not actively bleeding duodenum ulcer. A clip was placed but this was insufficient to control the hemorrhage. The patient was taken urgently to interventional Radiology for angiogram and embolization.  EXAM: SELECTIVE VISCERAL ARTERIOGRAPHY; ARTERIOGRAPHY; IR EMBO ART VEN HEMORR LYMPH EXTRAV INC GUIDE ROADMAPPING; IR ULTRASOUND GUIDANCE VASC ACCESS RIGHT  Date: 10/27/2013  PROCEDURE: 1. Ultrasound-guided puncture of the right common femoral artery 2. Catheterization of celiac artery with arteriogram 3. Catheterization of the common hepatic artery with arteriogram 4. Catheterization of the gastroduodenal artery with arteriogram 5. Catheterization of a super duodenum branch of the gastroduodenal artery with arteriogram 6. Coil embolization of the supra duodenal branch 7. Coil embolization of the gastroduodenal artery 8. Catheterization of the superior mesenteric artery with arteriogram 9. Limited right common femoral arteriogram 10. Application of a Mynx percutaneous arterial closure device Interventional Radiologist:  Criselda Peaches, MD  ANESTHESIA/SEDATION: Moderate (conscious)  sedation was used. 4 mg Versed, 50 mcg Fentanyl were administered intravenously. The patient's vital signs were monitored continuously by radiology nursing throughout the procedure.  Sedation Time: 75 minutes  FLUOROSCOPY TIME:  27 min 24 seconds  CONTRAST:  47mL OMNIPAQUE IOHEXOL 300 MG/ML  SOLN  TECHNIQUE: Informed consent was obtained from the patient following explanation of the procedure, risks, benefits and alternatives. The patient understands, agrees and consents for the procedure. All questions were addressed. A time out was performed.  Maximal barrier sterile technique utilized including caps, mask, sterile gowns, sterile gloves, large sterile drape, hand hygiene, and Betadine skin prep.  The right groin was interrogated with ultrasound. The common femoral artery was found to be widely patent. Local anesthesia was attained by infiltration with 1% lidocaine. A small dermatotomy was made. Under real-time sonographic guidance, the common femoral artery was punctured with a 21 gauge micropuncture needle. Image was obtained and stored for the medical record. Using a transitional 5 French micro sheath, a Bentson wire was advanced of the abdominal aorta. A C2 Cobra catheter was advanced to the aorta. The celiac artery was selected and an arteriogram attempted. However, the C2 catheter achieved insufficient purchase. Therefore, the C2 catheter was exchanged for a Mickelson catheter. The Mickelson catheter was secured is slightly more secure early and celiac artery origin. The celiac arteriogram was performed. There is is relatively conventional hepatic arterial anatomy. The gastroduodenal artery is relatively identified.  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 A Renegade ST micro catheter was then navigated into the gastroduodenal artery. Gastroduodenal arteriogram was performed. No definite active  hemorrhage is identified. There is a supraduodenal branch off the proximal GDA. This was catheterized and an arteriogram performed. Again, no active arterial bleeding. However, the vessels are in the immediate vicinity of the endoscopically placed clips. The decision was made to proceed with coil embolization of the supraduodenal arterial branch and gastroduodenal artery to decrease the pressure head and reduce the risk and severity of recurrent bleeding. The supraduodenal branch was coil embolized using first a 2 x 4 x 41 mm interlock diamond coil followed by a 3 x 120 mm interlock coil. The micro catheter was then advanced into the gastroduodenal artery at its junction with the right gastric artery. The vessel was then coil embolized using first a 6 x 100 mm interlock coil followed by a single 5 x 80 mm coil and two 5 x 150 mm coils. The vessel was embolized nearly to the origin. The micro catheter was pulled back into the common hepatic artery and a post embolization arteriogram was performed confirming no further flow into the GDA or supraduodenal branch vessels.  The micro catheter was removed. The Mickelson catheter was navigated in this SMA. A superior mesenteric arteriogram was performed. No definite vascularity in the region of the clips. No retrograde filling of the coil packs. The catheter was removed.  Through the access sheath, a limited right common femoral arteriogram was performed. The access is within the common femoral artery above the bifurcation below the inferior genu of the inferior epigastric artery. The groin was re-prepped with chlorhexidine. 2 g Ancef was administered intravenously. Arterial hemostasis was achieved with the assistance of a Mynx Ace arterial closure device.  IMPRESSION: 1. Successful coil embolization of the gastroduodenal artery and proximal supraduodenal branch in the region of the endoscopically placed clips. 2. No definite active arterial hemorrhage was identified during  the angiogram. 3. The patient received an additional unit of blood in interventional radiology in addition to the 1 unit received in endoscopy for a total of 2 units during his procedural course this afternoon. 4. By the end the procedure, the patient had attained hemodynamic stability.  Signed,  Criselda Peaches, MD  [facility]  [facility]   Electronically Signed   By: Jacqulynn Cadet M.D.   On: 10/27/2013 17:07   Ir US Guide Vasc Access Right  10/27/2013   CLINICAL DATA:  80 year old male with acute anemia secondary to active upper GI bleed. On upper endoscopy he was found have not actively bleeding duodenum ulcer. A clip was placed but this was insufficient to control the hemorrhage. The patient was taken urgently to interventional Radiology for angiogram and embolization.  EXAM: SELECTIVE VISCERAL ARTERIOGRAPHY; ARTERIOGRAPHY; IR EMBO ART VEN HEMORR LYMPH EXTRAV INC GUIDE ROADMAPPING; IR ULTRASOUND GUIDANCE VASC ACCESS RIGHT  Date: 10/27/2013  PROCEDURE: 1. Ultrasound-guided puncture of the right common femoral artery 2. Catheterization of celiac artery with arteriogram 3. Catheterization of the common hepatic artery with arteriogram 4. Catheterization of the gastroduodenal artery with arteriogram 5. Catheterization of a super duodenum branch of the gastroduodenal artery with arteriogram 6. Coil embolization of the supra duodenal branch 7. Coil embolization of the gastroduodenal artery 8. Catheterization of the superior mesenteric artery with arteriogram 9. Limited right common femoral arteriogram 10. Application of a Mynx percutaneous arterial closure device Interventional Radiologist:  Myrle Sheng  K. Laurence Ferrari, MD  ANESTHESIA/SEDATION: Moderate (conscious) sedation was used. 4 mg Versed, 50 mcg Fentanyl were administered intravenously. The patient's vital signs were monitored continuously by radiology nursing throughout the procedure.  Sedation Time: 75 minutes   FLUOROSCOPY TIME:  27 min 24 seconds  CONTRAST:  47mL OMNIPAQUE IOHEXOL 300 MG/ML  SOLN  TECHNIQUE: Informed consent was obtained from the patient following explanation of the procedure, risks, benefits and alternatives. The patient understands, agrees and consents for the procedure. All questions were addressed. A time out was performed.  Maximal barrier sterile technique utilized including caps, mask, sterile gowns, sterile gloves, large sterile drape, hand hygiene, and Betadine skin prep.  The right groin was interrogated with ultrasound. The common femoral artery was found to be widely patent. Local anesthesia was attained by infiltration with 1% lidocaine. A small dermatotomy was made. Under real-time sonographic guidance, the common femoral artery was punctured with a 21 gauge micropuncture needle. Image was obtained and stored for the medical record. Using a transitional 5 French micro sheath, a Bentson wire was advanced of the abdominal aorta. A C2 Cobra catheter was advanced to the aorta. The celiac artery was selected and an arteriogram attempted. However, the C2 catheter achieved insufficient purchase. Therefore, the C2 catheter was exchanged for a Mickelson catheter. The Mickelson catheter was secured is slightly more secure early and celiac artery origin. The celiac arteriogram was performed. There is is relatively conventional hepatic arterial anatomy. The gastroduodenal artery is relatively identified.  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 A Renegade ST micro catheter was then navigated into the gastroduodenal artery. Gastroduodenal arteriogram was performed. No definite active hemorrhage is identified. There is a supraduodenal branch off the proximal GDA. This was catheterized and an arteriogram performed. Again, no active arterial bleeding. However, the vessels are in the immediate  vicinity of the endoscopically placed clips. The decision was made to proceed with coil embolization of the supraduodenal arterial branch and gastroduodenal artery to decrease the pressure head and reduce the risk and severity of recurrent bleeding. The supraduodenal branch was coil embolized using first a 2 x 4 x 41 mm interlock diamond coil followed by a 3 x 120 mm interlock coil. The micro catheter was then advanced into the gastroduodenal artery at its junction with the right gastric artery. The vessel was then coil embolized using first a 6 x 100 mm interlock coil followed by a single 5 x 80 mm coil and two 5 x 150 mm coils. The vessel was embolized nearly to the origin. The micro catheter was pulled back into the common hepatic artery and a post embolization arteriogram was performed confirming no further flow into the GDA or supraduodenal branch vessels.  The micro catheter was removed. The Mickelson catheter was navigated in this SMA. A superior mesenteric arteriogram was performed. No definite vascularity in the region of the clips. No retrograde filling of the coil packs. The catheter was removed.  Through the access sheath, a limited right common femoral arteriogram was performed. The access is within the common femoral artery above the bifurcation below the inferior genu of the inferior epigastric artery. The groin was re-prepped with chlorhexidine. 2 g Ancef was administered intravenously. Arterial hemostasis was achieved with the assistance of a Mynx Ace arterial closure device.  IMPRESSION: 1. Successful coil embolization of the gastroduodenal artery and proximal supraduodenal branch in the region of the  endoscopically placed clips. 2. No definite active arterial hemorrhage was identified during the angiogram. 3. The patient received an additional unit of blood in interventional radiology in addition to the 1 unit received in endoscopy for a total of 2 units during his procedural course this  afternoon. 4. By the end the procedure, the patient had attained hemodynamic stability.  Signed,  Criselda Peaches, MD  [facility]  [facility] Radiology   Electronically Signed   By: Jacqulynn Cadet M.D.   On: 10/27/2013 17:07   Dg Chest Port 1 View  10/27/2013   CLINICAL DATA:  Duodenal ulcer coiling. Postprocedure. Asbestos exposure. A risk for aspiration.  EXAM: PORTABLE CHEST - 1 VIEW  COMPARISON:  10/26/2013  FINDINGS: Artifact degradation over the lung apices. Midline trachea. Moderate cardiomegaly. Left greater than right calcified pleural plaques. No pneumothorax. Mild volume loss at the medial left lung base. Diffuse peribronchial thickening.  IMPRESSION: Mild opacity at the medial left lung base.  Favor atelectasis.  Cardiomegaly without congestive failure.  Asbestos related pleural disease.   Electronically Signed   By: Abigail Miyamoto M.D.   On: 10/27/2013 19:10   [place] Guide Roadmapping  10/27/2013   CLINICAL DATA:  80 year old male with acute anemia secondary to active upper GI bleed. On upper endoscopy he was found have not actively bleeding duodenum ulcer. A clip was placed but this was insufficient to control the hemorrhage. The patient was taken urgently to interventional Radiology for angiogram and embolization.  EXAM: SELECTIVE VISCERAL ARTERIOGRAPHY; ARTERIOGRAPHY; IR EMBO ART VEN HEMORR LYMPH EXTRAV INC GUIDE ROADMAPPING; IR ULTRASOUND GUIDANCE VASC ACCESS RIGHT  Date: 10/27/2013  PROCEDURE: 1. Ultrasound-guided puncture of the right common femoral artery 2. Catheterization of celiac artery with arteriogram 3. Catheterization of the common hepatic artery with arteriogram 4. Catheterization of the gastroduodenal artery with arteriogram 5. Catheterization of a super duodenum branch of the gastroduodenal artery with arteriogram 6. Coil embolization of the supra duodenal branch 7. Coil embolization of the gastroduodenal  artery 8. Catheterization of the superior mesenteric artery with arteriogram 9. Limited right common femoral arteriogram 10. Application of a Mynx percutaneous arterial closure device Interventional Radiologist:  Criselda Peaches, MD  ANESTHESIA/SEDATION: Moderate (conscious) sedation was used. 4 mg Versed, 50 mcg Fentanyl were administered intravenously. The patient's vital signs were monitored continuously by radiology nursing throughout the procedure.  Sedation Time: 75 minutes  FLUOROSCOPY TIME:  27 min 24 seconds  CONTRAST:  49mL OMNIPAQUE IOHEXOL 300 MG/ML  SOLN  TECHNIQUE: Informed consent was obtained from the patient following explanation of the procedure, risks, benefits and alternatives. The patient understands, agrees and consents for the procedure. All questions were addressed. A time out was performed.  Maximal barrier sterile technique utilized including caps, mask, sterile gowns, sterile gloves, large sterile drape, hand hygiene, and Betadine skin prep.  The right groin was interrogated with ultrasound. The common femoral artery was found to be widely patent. Local anesthesia was attained by infiltration with 1% lidocaine. A small dermatotomy was made. Under real-time sonographic guidance, the common femoral artery was punctured with a 21 gauge micropuncture needle. Image was obtained and stored for the medical record. Using a transitional 5 French micro sheath, a Bentson wire was advanced of the abdominal aorta. A C2 Cobra catheter was advanced to the aorta. The celiac artery was selected and an arteriogram attempted. However, the C2 catheter achieved insufficient purchase. Therefore, the C2 catheter was  exchanged for a Mickelson catheter. The Mickelson catheter was secured is slightly more secure early and celiac artery origin. The celiac arteriogram was performed. There is is relatively conventional hepatic arterial anatomy. The gastroduodenal artery is relatively identified.  Attempts were made  to advance the 5 French catheter into the common hepatic artery, however this was unsuccessful secondary to stenosis of the celiac artery. A Renegade ST micro catheter was then navigated into the gastroduodenal artery. Gastroduodenal arteriogram was performed. No definite active hemorrhage is identified. There is a supraduodenal branch off the proximal GDA. This was catheterized and an arteriogram performed. Again, no active arterial bleeding. However, the vessels are in the immediate vicinity of the endoscopically placed clips. The decision was made to proceed with coil embolization of the supraduodenal arterial branch and gastroduodenal artery to decrease the pressure head and reduce the risk and severity of recurrent bleeding. The supraduodenal branch was coil embolized using first a 2 x 4 x 41 mm interlock diamond coil followed by a 3 x 120 mm interlock coil. The micro catheter was then advanced into the gastroduodenal artery at its junction with the right gastric artery. The vessel was then coil embolized using first a 6 x 100 mm interlock coil followed by a single 5 x 80 mm coil and two 5 x 150 mm coils. The vessel was embolized nearly to the origin. The micro catheter was pulled back into the common hepatic artery and a post embolization arteriogram was performed confirming no further flow into the GDA or supraduodenal branch vessels.  The micro catheter was removed. The Mickelson catheter was navigated in this SMA. A superior mesenteric arteriogram was performed. No definite vascularity in the region of the clips. No retrograde filling of the coil packs. The catheter was removed.  Through the access sheath, a limited right common femoral arteriogram was performed. The access is within the common femoral artery above the bifurcation below the inferior genu of the inferior epigastric artery. The groin was re-prepped with chlorhexidine. 2 g Ancef was administered intravenously. Arterial hemostasis was achieved  with the assistance of a Mynx Ace arterial closure device.  IMPRESSION: 1. Successful coil embolization of the gastroduodenal artery and proximal supraduodenal branch in the region of the endoscopically placed clips. 2. No definite active arterial hemorrhage was identified during the angiogram. 3. The patient received an additional unit of blood in interventional radiology in addition to the 1 unit received in endoscopy for a total of 2 units during his procedural course this afternoon. 4. By the end the procedure, the patient had attained hemodynamic stability.  Signed,  Criselda Peaches, MD  [facility]  [facility]   Electronically Signed   By: Jacqulynn Cadet M.D.   On: 10/27/2013 17:07    Assessment/Plan: Duodenal bleeding ulcer s/p mesenteric arteriogram and coil embolization of GDA 4/18. melanous stools 4/19, s/p 2U PRBC on 4/19  H/H trending up today hgb 7.5 (6.8)   LOS: 3 days    Hedy Jacob PA-C 10/29/2013 12:48 PM

## 2013-10-29 NOTE — Progress Notes (Signed)
Unassigned patient Subjective: Patient seems to be doing well today after having his gastroduodenal artery embolized yesterday as a clip placed during endoscopy could not control there bleeding from a VV in the duodenal ulcer. He denies having any nausea, vomiting or abdominal pain. Tolerating full liquids without any problems. Had a BM this morning but cannot say for sure whether it was melenic or not.   Objective: Vital signs in last 24 hours: Temp:  [97.4 F (36.3 C)-98.8 F (37.1 C)] 98.8 F (37.1 C) (04/20 1432) Pulse Rate:  [66-97] 70 (04/20 1432) Resp:  [12-27] 18 (04/20 1432) BP: (90-129)/(40-71) 107/61 mmHg (04/20 1432) SpO2:  [99 %-100 %] 100 % (04/20 1432) Weight:  [73.9 kg (162 lb 14.7 oz)] 73.9 kg (162 lb 14.7 oz) (04/20 0337) Last BM Date: 10/28/13  Intake/Output from previous day: 04/19 0701 - 04/20 0700 In: 1144.7 [I.V.:343; Blood:801.7] Out: 1485 [Urine:1485] Intake/Output this shift: Total I/O In: -  Out: 200 [Urine:200]  General appearance: cooperative, appears stated age, no distress and moderately obese Resp: clear to auscultation bilaterally Cardio: regular rate and rhythm, S1, S2 normal, no murmur, click, rub or gallop GI: soft, non-tender; bowel sounds normal; no masses,  no organomegaly  Lab Results:  Recent Labs  10/28/13 2350 10/29/13 0742 10/29/13 1520  WBC 10.7* 8.9 9.0  HGB 6.8* 7.5* 7.5*  HCT 19.6* 21.3* 21.5*  PLT 134* 122* 130*   BMET  Recent Labs  10/27/13 0910 10/27/13 2221 10/28/13 1410  NA 142 144 145  K 4.5 4.1 3.7  CL 112 115* 117*  CO2 20 20 18*  GLUCOSE 169* 161* 137*  BUN 70* 65* 61*  CREATININE 0.84 0.83 0.77  CALCIUM 7.2* 7.7* 7.6*   LFT  Recent Labs  10/27/13 0910  PROT 3.6*  ALBUMIN 1.6*  AST 10  ALT <5  ALKPHOS 43  BILITOT 0.6   PT/INR  Recent Labs  10/26/13 2130 10/29/13 0125  LABPROT 14.8 15.3*  INR 1.19 1.24   Studies/Results: Dg Chest Port 1 View  10/27/2013   CLINICAL DATA:  Duodenal  ulcer coiling. Postprocedure. Asbestos exposure. A risk for aspiration.  EXAM: PORTABLE CHEST - 1 VIEW  COMPARISON:  10/26/2013  FINDINGS: Artifact degradation over the lung apices. Midline trachea. Moderate cardiomegaly. Left greater than right calcified pleural plaques. No pneumothorax. Mild volume loss at the medial left lung base. Diffuse peribronchial thickening.  IMPRESSION: Mild opacity at the medial left lung base.  Favor atelectasis.  Cardiomegaly without congestive failure.  Asbestos related pleural disease.   Electronically Signed   By: Abigail Miyamoto M.D.   On: 10/27/2013 19:10   Medications: I have reviewed the patient's current medications.  Assessment/Plan: Anemia after an acute UGI bleed secondary to a DU-patient is s/p embolization of his GDA. Seems stable currently. Will need serial CBC's monitored. Advance hid diet as tolerated and patient should strongly refrain from using any NSAIDS, Alkasletzer, TUMS or Rolaids.  LOS: 3 days   Juanita Craver 10/29/2013, 5:21 PM

## 2013-10-29 NOTE — Progress Notes (Signed)
Subjective: Overnight Hgb dropped to 6.8 was transfused an additional unit. Patient feeling well, has no complaints. Per nursing patient has not had any bowel movements since early yesterday. Objective: Vital signs in last 24 hours: Filed Vitals:   10/29/13 0500 10/29/13 0515 10/29/13 0600 10/29/13 0803  BP: 108/47 108/50 94/46 104/43  Pulse: 80 83 87 72  Temp:  98.1 F (36.7 C)  98.4 F (36.9 C)  TempSrc:  Oral  Axillary  Resp: 23 16 15 22   Height:      Weight:      SpO2: 100% 100% 100% 100%   Weight change:   Intake/Output Summary (Last 24 hours) at 10/29/13 0848 Last data filed at 10/29/13 0515  Gross per 24 hour  Intake 1044.7 ml  Output   1435 ml  Net -390.3 ml   General: resting in bed, NAD HEENT: EOMI, no scleral icterus Cardiac: [id number] systolic murmur Pulm: CTA b/l Abd: soft, nontender, nondistended, BS present Ext: warm and well perfused, no pedal edema Neuro: alert and oriented X3 Lab Results: Basic Metabolic Panel:  Recent Labs Lab 10/27/13 2221 10/28/13 1410  NA 144 145  K 4.1 3.7  CL 115* 117*  CO2 20 18*  GLUCOSE 161* 137*  BUN 65* 61*  CREATININE 0.83 0.77  CALCIUM 7.7* 7.6*   Liver Function Tests:  Recent Labs Lab 10/27/13 0910  AST 10  ALT <5  ALKPHOS 43  BILITOT 0.6  PROT 3.6*  ALBUMIN 1.6*   CBC:  Recent Labs Lab 10/26/13 0900  10/28/13 2350 10/29/13 0742  WBC 10.9*  < > 10.7* 8.9  NEUTROABS 10.0*  --   --   --   HGB 6.2*  < > 6.8* 7.5*  HCT 18.8*  < > 19.6* 21.3*  MCV 82.1  < > 83.8 84.2  PLT 228  < > 134* 122*  < > = values in this interval not displayed. Cardiac Enzymes:  Recent Labs Lab 10/26/13 2130 10/27/13 0910  TROPONINI <0.30 <0.30   CBG:  Recent Labs Lab 10/26/13 1508 10/27/13 0745 10/27/13 1700 10/28/13 0740 10/29/13 0806  GLUCAP 145* 167* 172* 147* 113*   Coagulation:  Recent Labs Lab 10/26/13 2130 10/29/13 0125  LABPROT 14.8 15.3*  INR 1.19 1.24   Urine Drug Screen: Drugs of  Abuse     Component Value Date/Time   LABOPIA NONE DETECTED 10/26/2013 1400   COCAINSCRNUR NONE DETECTED 10/26/2013 1400   LABBENZ NONE DETECTED 10/26/2013 1400   AMPHETMU NONE DETECTED 10/26/2013 1400   THCU NONE DETECTED 10/26/2013 1400   LABBARB NONE DETECTED 10/26/2013 1400    Alcohol Level: No results found for this basename: ETH,  in the last 168 hours Urinalysis:  Recent Labs Lab 10/26/13 1400  COLORURINE YELLOW  LABSPEC 1.022  PHURINE 5.0  GLUCOSEU NEGATIVE  HGBUR LARGE*  BILIRUBINUR NEGATIVE  KETONESUR NEGATIVE  PROTEINUR 30*  UROBILINOGEN 0.2  NITRITE NEGATIVE  LEUKOCYTESUR LARGE*    Micro Results: Recent Results (from the past 240 hour(s))  URINE CULTURE     Status: None   Collection Time    10/26/13  2:00 PM      Result Value Ref Range Status   Specimen Description URINE, CLEAN CATCH   Final   Special Requests NONE   Final   Culture  Setup Time     Final   Value: 10/26/2013 19:19     Performed at [place]     Final   Value:  95,000 COLONIES/ML     Performed at [facility]     Final   Value: ENTEROCOCCUS SPECIES     Performed at [facility]   Report Status 10/28/2013 FINAL   Final   Organism ID, Bacteria ENTEROCOCCUS SPECIES   Final  MRSA PCR SCREENING     Status: None   Collection Time    10/26/13  3:20 PM      Result Value Ref Range Status   MRSA by PCR NEGATIVE  NEGATIVE Final   Comment:            The GeneXpert MRSA Assay (FDA     approved for NASAL specimens     only), is one component of a     comprehensive MRSA colonization     surveillance program. It is not     intended to diagnose MRSA     infection nor to guide or     monitor treatment for     MRSA infections.   Studies/Results: Ir Angiogram Visceral Selective  10/27/2013   CLINICAL DATA:  80 year old male with acute anemia secondary to active upper GI bleed. On upper endoscopy he was found have not actively bleeding duodenum  ulcer. A clip was placed but this was insufficient to control the hemorrhage. The patient was taken urgently to interventional Radiology for angiogram and embolization.  EXAM: SELECTIVE VISCERAL ARTERIOGRAPHY; ARTERIOGRAPHY; IR EMBO ART VEN HEMORR LYMPH EXTRAV INC GUIDE ROADMAPPING; IR ULTRASOUND GUIDANCE VASC ACCESS RIGHT  Date: 10/27/2013  PROCEDURE: 1. Ultrasound-guided puncture of the right common femoral artery 2. Catheterization of celiac artery with arteriogram 3. Catheterization of the common hepatic artery with arteriogram 4. Catheterization of the gastroduodenal artery with arteriogram 5. Catheterization of a super duodenum branch of the gastroduodenal artery with arteriogram 6. Coil embolization of the supra duodenal branch 7. Coil embolization of the gastroduodenal artery 8. Catheterization of the superior mesenteric artery with arteriogram 9. Limited right common femoral arteriogram 10. Application of a Mynx percutaneous arterial closure device Interventional Radiologist:  Criselda Peaches, MD  ANESTHESIA/SEDATION: Moderate (conscious) sedation was used. 4 mg Versed, 50 mcg Fentanyl were administered intravenously. The patient's vital signs were monitored continuously by radiology nursing throughout the procedure.  Sedation Time: 75 minutes  FLUOROSCOPY TIME:  27 min 24 seconds  CONTRAST:  13mL OMNIPAQUE IOHEXOL 300 MG/ML  SOLN  TECHNIQUE: Informed consent was obtained from the patient following explanation of the procedure, risks, benefits and alternatives. The patient understands, agrees and consents for the procedure. All questions were addressed. A time out was performed.  Maximal barrier sterile technique utilized including caps, mask, sterile gowns, sterile gloves, large sterile drape, hand hygiene, and Betadine skin prep.  The right groin was interrogated with ultrasound. The common femoral artery was found to be widely patent. Local anesthesia was attained by infiltration with 1% lidocaine. A  small dermatotomy was made. Under real-time sonographic guidance, the common femoral artery was punctured with a 21 gauge micropuncture needle. Image was obtained and stored for the medical record. Using a transitional 5 French micro sheath, a Bentson wire was advanced of the abdominal aorta. A C2 Cobra catheter was advanced to the aorta. The celiac artery was selected and an arteriogram attempted. However, the C2 catheter achieved insufficient purchase. Therefore, the C2 catheter was exchanged for a Mickelson catheter. The Mickelson catheter was secured is slightly more secure early and celiac artery origin. The celiac arteriogram was performed. There is is relatively conventional hepatic  arterial anatomy. The gastroduodenal artery is relatively identified.  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 A Renegade ST micro catheter was then navigated into the gastroduodenal artery. Gastroduodenal arteriogram was performed. No definite active hemorrhage is identified. There is a supraduodenal branch off the proximal GDA. This was catheterized and an arteriogram performed. Again, no active arterial bleeding. However, the vessels are in the immediate vicinity of the endoscopically placed clips. The decision was made to proceed with coil embolization of the supraduodenal arterial branch and gastroduodenal artery to decrease the pressure head and reduce the risk and severity of recurrent bleeding. The supraduodenal branch was coil embolized using first a 2 x 4 x 41 mm interlock diamond coil followed by a 3 x 120 mm interlock coil. The micro catheter was then advanced into the gastroduodenal artery at its junction with the right gastric artery. The vessel was then coil embolized using first a 6 x 100 mm interlock coil followed by a single 5 x 80 mm coil and two 5 x 150 mm coils. The vessel was embolized nearly  to the origin. The micro catheter was pulled back into the common hepatic artery and a post embolization arteriogram was performed confirming no further flow into the GDA or supraduodenal branch vessels.  The micro catheter was removed. The Mickelson catheter was navigated in this SMA. A superior mesenteric arteriogram was performed. No definite vascularity in the region of the clips. No retrograde filling of the coil packs. The catheter was removed.  Through the access sheath, a limited right common femoral arteriogram was performed. The access is within the common femoral artery above the bifurcation below the inferior genu of the inferior epigastric artery. The groin was re-prepped with chlorhexidine. 2 g Ancef was administered intravenously. Arterial hemostasis was achieved with the assistance of a Mynx Ace arterial closure device.  IMPRESSION: 1. Successful coil embolization of the gastroduodenal artery and proximal supraduodenal branch in the region of the endoscopically placed clips. 2. No definite active arterial hemorrhage was identified during the angiogram. 3. The patient received an additional unit of blood in interventional radiology in addition to the 1 unit received in endoscopy for a total of 2 units during his procedural course this afternoon. 4. By the end the procedure, the patient had attained hemodynamic stability.  Signed,  Criselda Peaches, MD  [facility]  [facility]   Electronically Signed   By: Jacqulynn Cadet M.D.   On: 10/27/2013 17:07   Ir Angiogram Visceral Selective  10/27/2013   CLINICAL DATA:  80 year old male with acute anemia secondary to active upper GI bleed. On upper endoscopy he was found have not actively bleeding duodenum ulcer. A clip was placed but this was insufficient to control the hemorrhage. The patient was taken urgently to interventional Radiology for angiogram and embolization.  EXAM: SELECTIVE VISCERAL  ARTERIOGRAPHY; ARTERIOGRAPHY; IR EMBO ART VEN HEMORR LYMPH EXTRAV INC GUIDE ROADMAPPING; IR ULTRASOUND GUIDANCE VASC ACCESS RIGHT  Date: 10/27/2013  PROCEDURE: 1. Ultrasound-guided puncture of the right common femoral artery 2. Catheterization of celiac artery with arteriogram 3. Catheterization of the common hepatic artery with arteriogram 4. Catheterization of the gastroduodenal artery with arteriogram 5. Catheterization of a super duodenum branch of the gastroduodenal artery with arteriogram 6. Coil embolization of the supra duodenal branch 7. Coil embolization of the gastroduodenal artery 8. Catheterization of the superior mesenteric artery  with arteriogram 9. Limited right common femoral arteriogram 10. Application of a Mynx percutaneous arterial closure device Interventional Radiologist:  Sterling Big, MD  ANESTHESIA/SEDATION: Moderate (conscious) sedation was used. 4 mg Versed, 50 mcg Fentanyl were administered intravenously. The patient's vital signs were monitored continuously by radiology nursing throughout the procedure.  Sedation Time: 75 minutes  FLUOROSCOPY TIME:  27 min 24 seconds  CONTRAST:  47mL OMNIPAQUE IOHEXOL 300 MG/ML  SOLN  TECHNIQUE: Informed consent was obtained from the patient following explanation of the procedure, risks, benefits and alternatives. The patient understands, agrees and consents for the procedure. All questions were addressed. A time out was performed.  Maximal barrier sterile technique utilized including caps, mask, sterile gowns, sterile gloves, large sterile drape, hand hygiene, and Betadine skin prep.  The right groin was interrogated with ultrasound. The common femoral artery was found to be widely patent. Local anesthesia was attained by infiltration with 1% lidocaine. A small dermatotomy was made. Under real-time sonographic guidance, the common femoral artery was punctured with a 21 gauge micropuncture needle. Image was obtained and stored for the medical  record. Using a transitional 5 French micro sheath, a Bentson wire was advanced of the abdominal aorta. A C2 Cobra catheter was advanced to the aorta. The celiac artery was selected and an arteriogram attempted. However, the C2 catheter achieved insufficient purchase. Therefore, the C2 catheter was exchanged for a Mickelson catheter. The Mickelson catheter was secured is slightly more secure early and celiac artery origin. The celiac arteriogram was performed. There is is relatively conventional hepatic arterial anatomy. The gastroduodenal artery is relatively identified.  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 A Renegade ST micro catheter was then navigated into the gastroduodenal artery. Gastroduodenal arteriogram was performed. No definite active hemorrhage is identified. There is a supraduodenal branch off the proximal GDA. This was catheterized and an arteriogram performed. Again, no active arterial bleeding. However, the vessels are in the immediate vicinity of the endoscopically placed clips. The decision was made to proceed with coil embolization of the supraduodenal arterial branch and gastroduodenal artery to decrease the pressure head and reduce the risk and severity of recurrent bleeding. The supraduodenal branch was coil embolized using first a 2 x 4 x 41 mm interlock diamond coil followed by a 3 x 120 mm interlock coil. The micro catheter was then advanced into the gastroduodenal artery at its junction with the right gastric artery. The vessel was then coil embolized using first a 6 x 100 mm interlock coil followed by a single 5 x 80 mm coil and two 5 x 150 mm coils. The vessel was embolized nearly to the origin. The micro catheter was pulled back into the common hepatic artery and a post embolization arteriogram was performed confirming no further flow into the GDA or supraduodenal  branch vessels.  The micro catheter was removed. The Mickelson catheter was navigated in this SMA. A superior mesenteric arteriogram was performed. No definite vascularity in the region of the clips. No retrograde filling of the coil packs. The catheter was removed.  Through the access sheath, a limited right common femoral arteriogram was performed. The access is within the common femoral artery above the bifurcation below the inferior genu of the inferior epigastric artery. The groin was re-prepped with chlorhexidine. 2 g Ancef was administered intravenously. Arterial hemostasis was achieved with the assistance of a Mynx Ace arterial  closure device.  IMPRESSION: 1. Successful coil embolization of the gastroduodenal artery and proximal supraduodenal branch in the region of the endoscopically placed clips. 2. No definite active arterial hemorrhage was identified during the angiogram. 3. The patient received an additional unit of blood in interventional radiology in addition to the 1 unit received in endoscopy for a total of 2 units during his procedural course this afternoon. 4. By the end the procedure, the patient had attained hemodynamic stability.  Signed,  Criselda Peaches, MD  [facility]  [facility]   Electronically Signed   By: Jacqulynn Cadet M.D.   On: 10/27/2013 17:07   Ir Angiogram Follow Up Study  10/27/2013   CLINICAL DATA:  80 year old male with acute anemia secondary to active upper GI bleed. On upper endoscopy he was found have not actively bleeding duodenum ulcer. A clip was placed but this was insufficient to control the hemorrhage. The patient was taken urgently to interventional Radiology for angiogram and embolization.  EXAM: SELECTIVE VISCERAL ARTERIOGRAPHY; ARTERIOGRAPHY; IR EMBO ART VEN HEMORR LYMPH EXTRAV INC GUIDE ROADMAPPING; IR ULTRASOUND GUIDANCE VASC ACCESS RIGHT  Date: 10/27/2013  PROCEDURE: 1. Ultrasound-guided puncture of the  right common femoral artery 2. Catheterization of celiac artery with arteriogram 3. Catheterization of the common hepatic artery with arteriogram 4. Catheterization of the gastroduodenal artery with arteriogram 5. Catheterization of a super duodenum branch of the gastroduodenal artery with arteriogram 6. Coil embolization of the supra duodenal branch 7. Coil embolization of the gastroduodenal artery 8. Catheterization of the superior mesenteric artery with arteriogram 9. Limited right common femoral arteriogram 10. Application of a Mynx percutaneous arterial closure device Interventional Radiologist:  Criselda Peaches, MD  ANESTHESIA/SEDATION: Moderate (conscious) sedation was used. 4 mg Versed, 50 mcg Fentanyl were administered intravenously. The patient's vital signs were monitored continuously by radiology nursing throughout the procedure.  Sedation Time: 75 minutes  FLUOROSCOPY TIME:  27 min 24 seconds  CONTRAST:  78mL OMNIPAQUE IOHEXOL 300 MG/ML  SOLN  TECHNIQUE: Informed consent was obtained from the patient following explanation of the procedure, risks, benefits and alternatives. The patient understands, agrees and consents for the procedure. All questions were addressed. A time out was performed.  Maximal barrier sterile technique utilized including caps, mask, sterile gowns, sterile gloves, large sterile drape, hand hygiene, and Betadine skin prep.  The right groin was interrogated with ultrasound. The common femoral artery was found to be widely patent. Local anesthesia was attained by infiltration with 1% lidocaine. A small dermatotomy was made. Under real-time sonographic guidance, the common femoral artery was punctured with a 21 gauge micropuncture needle. Image was obtained and stored for the medical record. Using a transitional 5 French micro sheath, a Bentson wire was advanced of the abdominal aorta. A C2 Cobra catheter was advanced to the aorta. The celiac artery was selected and an arteriogram  attempted. However, the C2 catheter achieved insufficient purchase. Therefore, the C2 catheter was exchanged for a Mickelson catheter. The Mickelson catheter was secured is slightly more secure early and celiac artery origin. The celiac arteriogram was performed. There is is relatively conventional hepatic arterial anatomy. The gastroduodenal artery is relatively identified.  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 A Renegade ST micro catheter was then navigated into the gastroduodenal artery. Gastroduodenal arteriogram was performed. No definite active hemorrhage is identified. There is a supraduodenal branch off the  proximal GDA. This was catheterized and an arteriogram performed. Again, no active arterial bleeding. However, the vessels are in the immediate vicinity of the endoscopically placed clips. The decision was made to proceed with coil embolization of the supraduodenal arterial branch and gastroduodenal artery to decrease the pressure head and reduce the risk and severity of recurrent bleeding. The supraduodenal branch was coil embolized using first a 2 x 4 x 41 mm interlock diamond coil followed by a 3 x 120 mm interlock coil. The micro catheter was then advanced into the gastroduodenal artery at its junction with the right gastric artery. The vessel was then coil embolized using first a 6 x 100 mm interlock coil followed by a single 5 x 80 mm coil and two 5 x 150 mm coils. The vessel was embolized nearly to the origin. The micro catheter was pulled back into the common hepatic artery and a post embolization arteriogram was performed confirming no further flow into the GDA or supraduodenal branch vessels.  The micro catheter was removed. The Mickelson catheter was navigated in this SMA. A superior mesenteric arteriogram was performed. No definite vascularity in the region of the clips. No  retrograde filling of the coil packs. The catheter was removed.  Through the access sheath, a limited right common femoral arteriogram was performed. The access is within the common femoral artery above the bifurcation below the inferior genu of the inferior epigastric artery. The groin was re-prepped with chlorhexidine. 2 g Ancef was administered intravenously. Arterial hemostasis was achieved with the assistance of a Mynx Ace arterial closure device.  IMPRESSION: 1. Successful coil embolization of the gastroduodenal artery and proximal supraduodenal branch in the region of the endoscopically placed clips. 2. No definite active arterial hemorrhage was identified during the angiogram. 3. The patient received an additional unit of blood in interventional radiology in addition to the 1 unit received in endoscopy for a total of 2 units during his procedural course this afternoon. 4. By the end the procedure, the patient had attained hemodynamic stability.  Signed,  Criselda Peaches, MD  [facility]  [facility]   Electronically Signed   By: Jacqulynn Cadet M.D.   On: 10/27/2013 17:07   Ir US Guide Vasc Access Right  10/27/2013   CLINICAL DATA:  80 year old male with acute anemia secondary to active upper GI bleed. On upper endoscopy he was found have not actively bleeding duodenum ulcer. A clip was placed but this was insufficient to control the hemorrhage. The patient was taken urgently to interventional Radiology for angiogram and embolization.  EXAM: SELECTIVE VISCERAL ARTERIOGRAPHY; ARTERIOGRAPHY; IR EMBO ART VEN HEMORR LYMPH EXTRAV INC GUIDE ROADMAPPING; IR ULTRASOUND GUIDANCE VASC ACCESS RIGHT  Date: 10/27/2013  PROCEDURE: 1. Ultrasound-guided puncture of the right common femoral artery 2. Catheterization of celiac artery with arteriogram 3. Catheterization of the common hepatic artery with arteriogram 4. Catheterization of the gastroduodenal artery with  arteriogram 5. Catheterization of a super duodenum branch of the gastroduodenal artery with arteriogram 6. Coil embolization of the supra duodenal branch 7. Coil embolization of the gastroduodenal artery 8. Catheterization of the superior mesenteric artery with arteriogram 9. Limited right common femoral arteriogram 10. Application of a Mynx percutaneous arterial closure device Interventional Radiologist:  Criselda Peaches, MD  ANESTHESIA/SEDATION: Moderate (conscious) sedation was used. 4 mg Versed, 50 mcg Fentanyl were administered intravenously. The patient's vital signs were monitored continuously by radiology nursing throughout the procedure.  Sedation Time: 75 minutes  FLUOROSCOPY TIME:  27 min 24 seconds  CONTRAST:  33mL OMNIPAQUE IOHEXOL 300 MG/ML  SOLN  TECHNIQUE: Informed consent was obtained from the patient following explanation of the procedure, risks, benefits and alternatives. The patient understands, agrees and consents for the procedure. All questions were addressed. A time out was performed.  Maximal barrier sterile technique utilized including caps, mask, sterile gowns, sterile gloves, large sterile drape, hand hygiene, and Betadine skin prep.  The right groin was interrogated with ultrasound. The common femoral artery was found to be widely patent. Local anesthesia was attained by infiltration with 1% lidocaine. A small dermatotomy was made. Under real-time sonographic guidance, the common femoral artery was punctured with a 21 gauge micropuncture needle. Image was obtained and stored for the medical record. Using a transitional 5 French micro sheath, a Bentson wire was advanced of the abdominal aorta. A C2 Cobra catheter was advanced to the aorta. The celiac artery was selected and an arteriogram attempted. However, the C2 catheter achieved insufficient purchase. Therefore, the C2 catheter was exchanged for a Mickelson catheter. The Mickelson catheter was secured is slightly more secure early  and celiac artery origin. The celiac arteriogram was performed. There is is relatively conventional hepatic arterial anatomy. The gastroduodenal artery is relatively identified.  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 A Renegade ST micro catheter was then navigated into the gastroduodenal artery. Gastroduodenal arteriogram was performed. No definite active hemorrhage is identified. There is a supraduodenal branch off the proximal GDA. This was catheterized and an arteriogram performed. Again, no active arterial bleeding. However, the vessels are in the immediate vicinity of the endoscopically placed clips. The decision was made to proceed with coil embolization of the supraduodenal arterial branch and gastroduodenal artery to decrease the pressure head and reduce the risk and severity of recurrent bleeding. The supraduodenal branch was coil embolized using first a 2 x 4 x 41 mm interlock diamond coil followed by a 3 x 120 mm interlock coil. The micro catheter was then advanced into the gastroduodenal artery at its junction with the right gastric artery. The vessel was then coil embolized using first a 6 x 100 mm interlock coil followed by a single 5 x 80 mm coil and two 5 x 150 mm coils. The vessel was embolized nearly to the origin. The micro catheter was pulled back into the common hepatic artery and a post embolization arteriogram was performed confirming no further flow into the GDA or supraduodenal branch vessels.  The micro catheter was removed. The Mickelson catheter was navigated in this SMA. A superior mesenteric arteriogram was performed. No definite vascularity in the region of the clips. No retrograde filling of the coil packs. The catheter was removed.  Through the access sheath, a limited right common femoral arteriogram was performed. The access is within the common femoral artery  above the bifurcation below the inferior genu of the inferior epigastric artery. The groin was re-prepped with chlorhexidine. 2 g Ancef was administered intravenously. Arterial hemostasis was achieved with the assistance of a Mynx Ace arterial closure device.  IMPRESSION: 1. Successful coil embolization of the gastroduodenal artery and proximal supraduodenal branch in the region of the endoscopically placed clips. 2. No definite active arterial hemorrhage was identified during the angiogram. 3. The patient received an additional unit of blood in interventional radiology in addition to the 1 unit received in endoscopy for a total of 2 units  during his procedural course this afternoon. 4. By the end the procedure, the patient had attained hemodynamic stability.  Signed,  Criselda Peaches, MD  [facility]  [facility]   Electronically Signed   By: Jacqulynn Cadet M.D.   On: 10/27/2013 17:07   Dg Chest Port 1 View  10/27/2013   CLINICAL DATA:  Duodenal ulcer coiling. Postprocedure. Asbestos exposure. A risk for aspiration.  EXAM: PORTABLE CHEST - 1 VIEW  COMPARISON:  10/26/2013  FINDINGS: Artifact degradation over the lung apices. Midline trachea. Moderate cardiomegaly. Left greater than right calcified pleural plaques. No pneumothorax. Mild volume loss at the medial left lung base. Diffuse peribronchial thickening.  IMPRESSION: Mild opacity at the medial left lung base.  Favor atelectasis.  Cardiomegaly without congestive failure.  Asbestos related pleural disease.   Electronically Signed   By: Abigail Miyamoto M.D.   On: 10/27/2013 19:10   [facility]ukes Guide Roadmapping  10/27/2013   CLINICAL DATA:  80 year old male with acute anemia secondary to active upper GI bleed. On upper endoscopy he was found have not actively bleeding duodenum ulcer. A clip was placed but this was insufficient to control the hemorrhage. The patient was taken  urgently to interventional Radiology for angiogram and embolization.  EXAM: SELECTIVE VISCERAL ARTERIOGRAPHY; ARTERIOGRAPHY; IR EMBO ART VEN HEMORR LYMPH EXTRAV INC GUIDE ROADMAPPING; IR ULTRASOUND GUIDANCE VASC ACCESS RIGHT  Date: 10/27/2013  PROCEDURE: 1. Ultrasound-guided puncture of the right common femoral artery 2. Catheterization of celiac artery with arteriogram 3. Catheterization of the common hepatic artery with arteriogram 4. Catheterization of the gastroduodenal artery with arteriogram 5. Catheterization of a super duodenum branch of the gastroduodenal artery with arteriogram 6. Coil embolization of the supra duodenal branch 7. Coil embolization of the gastroduodenal artery 8. Catheterization of the superior mesenteric artery with arteriogram 9. Limited right common femoral arteriogram 10. Application of a Mynx percutaneous arterial closure device Interventional Radiologist:  Criselda Peaches, MD  ANESTHESIA/SEDATION: Moderate (conscious) sedation was used. 4 mg Versed, 50 mcg Fentanyl were administered intravenously. The patient's vital signs were monitored continuously by radiology nursing throughout the procedure.  Sedation Time: 75 minutes  FLUOROSCOPY TIME:  27 min 24 seconds  CONTRAST:  21mL OMNIPAQUE IOHEXOL 300 MG/ML  SOLN  TECHNIQUE: Informed consent was obtained from the patient following explanation of the procedure, risks, benefits and alternatives. The patient understands, agrees and consents for the procedure. All questions were addressed. A time out was performed.  Maximal barrier sterile technique utilized including caps, mask, sterile gowns, sterile gloves, large sterile drape, hand hygiene, and Betadine skin prep.  The right groin was interrogated with ultrasound. The common femoral artery was found to be widely patent. Local anesthesia was attained by infiltration with 1% lidocaine. A small dermatotomy was made. Under real-time sonographic guidance, the common femoral artery was  punctured with a 21 gauge micropuncture needle. Image was obtained and stored for the medical record. Using a transitional 5 French micro sheath, a Bentson wire was advanced of the abdominal aorta. A C2 Cobra catheter was advanced to the aorta. The celiac artery was selected and an arteriogram attempted. However, the C2 catheter achieved insufficient purchase. Therefore, the C2 catheter was exchanged for a Mickelson catheter. The Mickelson catheter was secured is slightly more secure early and celiac artery origin. The celiac arteriogram was performed. There is is relatively conventional hepatic arterial anatomy. The gastroduodenal artery is relatively identified.  Attempts were  made to advance the 5 French catheter into the common hepatic artery, however this was unsuccessful secondary to stenosis of the celiac artery. A Renegade ST micro catheter was then navigated into the gastroduodenal artery. Gastroduodenal arteriogram was performed. No definite active hemorrhage is identified. There is a supraduodenal branch off the proximal GDA. This was catheterized and an arteriogram performed. Again, no active arterial bleeding. However, the vessels are in the immediate vicinity of the endoscopically placed clips. The decision was made to proceed with coil embolization of the supraduodenal arterial branch and gastroduodenal artery to decrease the pressure head and reduce the risk and severity of recurrent bleeding. The supraduodenal branch was coil embolized using first a 2 x 4 x 41 mm interlock diamond coil followed by a 3 x 120 mm interlock coil. The micro catheter was then advanced into the gastroduodenal artery at its junction with the right gastric artery. The vessel was then coil embolized using first a 6 x 100 mm interlock coil followed by a single 5 x 80 mm coil and two 5 x 150 mm coils. The vessel was embolized nearly to the origin. The micro catheter was pulled back into the common hepatic artery and a post  embolization arteriogram was performed confirming no further flow into the GDA or supraduodenal branch vessels.  The micro catheter was removed. The Mickelson catheter was navigated in this SMA. A superior mesenteric arteriogram was performed. No definite vascularity in the region of the clips. No retrograde filling of the coil packs. The catheter was removed.  Through the access sheath, a limited right common femoral arteriogram was performed. The access is within the common femoral artery above the bifurcation below the inferior genu of the inferior epigastric artery. The groin was re-prepped with chlorhexidine. 2 g Ancef was administered intravenously. Arterial hemostasis was achieved with the assistance of a Mynx Ace arterial closure device.  IMPRESSION: 1. Successful coil embolization of the gastroduodenal artery and proximal supraduodenal branch in the region of the endoscopically placed clips. 2. No definite active arterial hemorrhage was identified during the angiogram. 3. The patient received an additional unit of blood in interventional radiology in addition to the 1 unit received in endoscopy for a total of 2 units during his procedural course this afternoon. 4. By the end the procedure, the patient had attained hemodynamic stability.  Signed,  Criselda Peaches, MD  [facility]  [facility]   Electronically Signed   By: Jacqulynn Cadet M.D.   On: 10/27/2013 17:07   Medications: I have reviewed the patient's current medications. Scheduled Meds: . antiseptic oral rinse  15 mL Mouth Rinse BID  . pantoprazole (PROTONIX) IV  40 mg Intravenous Q12H  . sodium chloride  3 mL Intravenous Q12H  . sucralfate  1 g Oral TID WC & HS   Continuous Infusions: . sodium chloride 20 mL/hr (10/28/13 1821)   PRN Meds:.acetaminophen, morphine injection Assessment/Plan: Symptomatic acute blood anemia 2/2 bleeding duodenal ulcer - Hgb 6.8 last night was transfused 1  units. Hgb this AM 7.5.  Has received 9 units total - Continue following CBC q8 to ensure patient is no longer actively bleeding. - GI following -Continue IV protnix BID -Carafate - Transfer to telemetry.  Hemorrhagic shock 2/2 to active GIB--now resolved.  -close monitoring of vitals, may need more blood or IVF    Cancer of dome of urinary bladder  -Outpatient follow up with Dr. Alen Blew  Dispo: Disposition is deferred at this time, awaiting  improvement of current medical problems.  Potential discharge tomorrow if H/H stable.  The patient does have a current PCP Redmond Pulling Arna Medici, MD) and does not need an [facility] hospital follow-up appointment after discharge.  The patient does not have transportation limitations that hinder transportation to clinic appointments.  .Services Needed at time of discharge: Y = Yes, Blank = No PT:   OT:   RN:   Equipment:   Other:     LOS: 3 days   Joni Reining, DO 10/29/2013, 8:48 AM

## 2013-10-30 DIAGNOSIS — R5381 Other malaise: Secondary | ICD-10-CM

## 2013-10-30 DIAGNOSIS — I498 Other specified cardiac arrhythmias: Secondary | ICD-10-CM

## 2013-10-30 LAB — GLUCOSE, CAPILLARY
GLUCOSE-CAPILLARY: 126 mg/dL — AB (ref 70–99)
Glucose-Capillary: 104 mg/dL — ABNORMAL HIGH (ref 70–99)

## 2013-10-30 LAB — TYPE AND SCREEN
ABO/RH(D): B POS
Antibody Screen: NEGATIVE
UNIT DIVISION: 0
Unit division: 0
Unit division: 0
Unit division: 0
Unit division: 0
Unit division: 0
Unit division: 0
Unit division: 0

## 2013-10-30 LAB — CBC
HCT: 21.5 % — ABNORMAL LOW (ref 39.0–52.0)
HEMATOCRIT: 21 % — AB (ref 39.0–52.0)
HEMATOCRIT: 23 % — AB (ref 39.0–52.0)
HEMOGLOBIN: 7.3 g/dL — AB (ref 13.0–17.0)
Hemoglobin: 7.8 g/dL — ABNORMAL LOW (ref 13.0–17.0)
Hemoglobin: 7.3 g/dL — ABNORMAL LOW (ref 13.0–17.0)
MCH: 29.7 pg (ref 26.0–34.0)
MCH: 29.2 pg (ref 26.0–34.0)
MCH: 29.4 pg (ref 26.0–34.0)
MCHC: 33.9 g/dL (ref 30.0–36.0)
MCHC: 34 g/dL (ref 30.0–36.0)
MCHC: 34.8 g/dL (ref 30.0–36.0)
MCV: 85.4 fL (ref 78.0–100.0)
MCV: 86.1 fL (ref 78.0–100.0)
MCV: 86.7 fL (ref 78.0–100.0)
PLATELETS: 148 10*3/uL — AB (ref 150–400)
Platelets: 124 10*3/uL — ABNORMAL LOW (ref 150–400)
Platelets: 147 10*3/uL — ABNORMAL LOW (ref 150–400)
RBC: 2.46 MIL/uL — ABNORMAL LOW (ref 4.22–5.81)
RBC: 2.48 MIL/uL — AB (ref 4.22–5.81)
RBC: 2.67 MIL/uL — AB (ref 4.22–5.81)
RDW: 15 % (ref 11.5–15.5)
RDW: 15.3 % (ref 11.5–15.5)
RDW: 15.6 % — AB (ref 11.5–15.5)
WBC: 7.6 10*3/uL (ref 4.0–10.5)
WBC: 8.8 10*3/uL (ref 4.0–10.5)
WBC: 8.9 10*3/uL (ref 4.0–10.5)

## 2013-10-30 MED ORDER — BIOTENE DRY MOUTH MT LIQD
15.0000 mL | Freq: Two times a day (BID) | OROMUCOSAL | Status: DC
  Administered 2013-10-30 – 2013-10-31 (×2): 15 mL via OROMUCOSAL

## 2013-10-30 MED ORDER — PANTOPRAZOLE SODIUM 40 MG PO TBEC
40.0000 mg | DELAYED_RELEASE_TABLET | Freq: Two times a day (BID) | ORAL | Status: DC
  Administered 2013-10-30 – 2013-10-31 (×2): 40 mg via ORAL
  Filled 2013-10-30 (×2): qty 1

## 2013-10-30 MED ORDER — ENSURE COMPLETE PO LIQD
237.0000 mL | Freq: Every day | ORAL | Status: DC
  Administered 2013-10-30 – 2013-10-31 (×2): 237 mL via ORAL

## 2013-10-30 NOTE — Evaluation (Signed)
Physical Therapy Evaluation Patient Details Name: Omar [name] MRN: [id number] DOB: January 23, 1916 Today's Date: 10/30/2013   History of Present Illness  Pt adm with GIB and underwent embolization 4/18. Pt with hx of hip fx, bladder CA, BPH, DVT  Clinical Impression  Pt admitted with above. Pt currently with functional limitations due to the deficits listed below (see PT Problem List).  Pt will benefit from skilled PT to increase their independence and safety with mobility to allow discharge to the venue listed below. Son present after treatment and states they would like to take pt home but wants to check with his brother who also cares for the pt before making final decision.      Follow Up Recommendations SNF (if family aren't able to provide the incr assistance.)    Equipment Recommendations  None recommended by PT    Recommendations for Other Services OT consult     Precautions / Restrictions Precautions Precautions: Fall Restrictions Weight Bearing Restrictions: No      Mobility  Bed Mobility Overal bed mobility: Needs Assistance Bed Mobility: Supine to Sit     Supine to sit: +2 for physical assistance;Max assist     General bed mobility comments: Assist to bring legs over and trunk up.  Transfers Overall transfer level: Needs assistance Equipment used: Rolling walker (2 wheeled) Transfers: Sit to/from Stand Sit to Stand: +2 physical assistance;Mod assist         General transfer comment: Verbal/tactile cues for technque and assist to bring hips up. Pt in flexed posture in standing.  Ambulation/Gait Ambulation/Gait assistance: Mod assist;+2 physical assistance Ambulation Distance (Feet): 10 Feet Assistive device: Rolling walker (2 wheeled) Gait Pattern/deviations: Step-to pattern;Decreased step length - right;Decreased step length - left;Shuffle;Trunk flexed     General Gait Details: Pt amb with flexed knees and hips.  Stairs             Wheelchair Mobility    Modified Rankin (Stroke Patients Only)       Balance Overall balance assessment: Needs assistance Sitting-balance support: Bilateral upper extremity supported Sitting balance-Leahy Scale: Poor     Standing balance support: Bilateral upper extremity supported Standing balance-Leahy Scale: Poor Standing balance comment: Upper extremity support on walker and mod A to maintain.                             Pertinent Vitals/Pain Dyspnea 3/4 on RA with amb. SaO2 99% at that time.    Home Living Family/patient expects to be discharged to:: Private residence Living Arrangements: Children Available Help at Discharge: Family;Available 24 hours/day Type of Home: House Home Access: Ramped entrance     Home Layout: One level Home Equipment: Walker - 4 wheels;Cane - quad Additional Comments: Pt's sons provide 24/7 care.     Prior Function Level of Independence: Needs assistance   Gait / Transfers Assistance Needed: Amb with rolllator. At times requires assist.  ADL's / Homemaking Assistance Needed: Sons assist with bathing.        Hand Dominance   Dominant Hand: Right    Extremity/Trunk Assessment   Upper Extremity Assessment: Generalized weakness           Lower Extremity Assessment: Generalized weakness         Communication   Communication: HOH  Cognition Arousal/Alertness: Awake/alert Behavior During Therapy: WFL for tasks assessed/performed Overall Cognitive Status: Difficult to assess  General Comments      Exercises        Assessment/Plan    PT Assessment Patient needs continued PT services  PT Diagnosis Difficulty walking;Generalized weakness   PT Problem List Decreased strength;Decreased activity tolerance;Decreased balance;Decreased mobility;Decreased knowledge of use of DME  PT Treatment Interventions DME instruction;Gait training;Patient/family education;Functional mobility  training;Therapeutic activities;Therapeutic exercise;Balance training   PT Goals (Current goals can be found in the Care Plan section) Acute Rehab PT Goals Patient Stated Goal: Return home PT Goal Formulation: With patient Time For Goal Achievement: 11/06/13 Potential to Achieve Goals: Good    Frequency Min 3X/week   Barriers to discharge        Co-evaluation               End of Session Equipment Utilized During Treatment: Gait belt Activity Tolerance: Patient limited by fatigue Patient left: in chair;with call bell/phone within reach;with chair alarm set Nurse Communication: Mobility status         Time: 5364-6803 PT Time Calculation (min): 29 min   Charges:   PT Evaluation $Initial PT Evaluation Tier I: 1 Procedure PT Treatments $Gait Training: 23-37 mins   PT G CodesShary Decamp Hamish Banks 10/30/2013, 11:00 AM  [employer] PT [phone]

## 2013-10-30 NOTE — Progress Notes (Signed)
INITIAL NUTRITION ASSESSMENT  DOCUMENTATION CODES Per approved criteria  -Not Applicable   INTERVENTION: Downgrade to Dysphagia 3 diet  Ensure po once daily, providing 350 kcals and 13 grams of protein   NUTRITION DIAGNOSIS: Biting/Chewing (Masticatory) Difficulty related to poor dentition as evidenced by pt's son reports of chewing difficulty.   Goal: Meet >/= 90% of estimated nutrition needs   Monitor:  PO intake, weight trends, supplement acceptance, labs   Reason for Assessment: Positive Malnutrition Screening Tool Score   80 y.o. male  Admitting Dx: Acute duodenal ulcer with bleeding  ASSESSMENT: 80 year old male admitted for anemia and melena. PMHx significant for DVT, BPH, bladder CA.   Pt was unable to give history as he is hard of hearing, but son was in the room who provided a detailed history. Pt's son reports that he eats well at home and has a very good appetite. Pt's son and his brother stay with pt and provide meals for him.   Pt dietary recall included: Breakfast: eggs, bacon, french toast, oatmeal Lunch: soup and sandwich Dinner: meat and vegetables  Son did not know how well the pt was eating in the hospital. RN stated that pt ate about 50% of his breakfast. Ensure may be beneficial to the pt to assist with healing and to ensure adequate protein and energy intake while an inpatient.   Pt is s/p the following:  EGD with control of bleeding  Pt's son did not know how much he normally weighed or if he had lost any weight. EPIC charts shows some weight loss in 2014 but pt has maintained current weight since January 2015.   Pt's son stated that he has trouble chewing but is swallowing well. Son indicated that chopped meats would be beneficial to the patient.   Nutrition Focused Physical Exam did not reveal any depletion of muscle mass or body fat. Pt is well nourished.   Elevated glucose, BUN   Height: Ht Readings from Last 1 Encounters:  10/26/13 5\' 10"   (1.778 m)    Weight: Wt Readings from Last 1 Encounters:  10/30/13 165 lb 9.1 oz (75.1 kg)    Ideal Body Weight: 166 lbs   % Ideal Body Weight: 100%   Wt Readings from Last 10 Encounters:  10/30/13 165 lb 9.1 oz (75.1 kg)  10/30/13 165 lb 9.1 oz (75.1 kg)  07/26/13 163 lb 9.6 oz (74.208 kg)  04/30/13 180 lb 6.4 oz (81.829 kg)  04/30/13 180 lb 6.4 oz (81.829 kg)  04/25/13 175 lb 14.4 oz (79.788 kg)  03/28/13 168 lb 4.8 oz (76.34 kg)  02/23/13 173 lb 6.4 oz (78.654 kg)  02/12/13 171 lb 9.6 oz (77.837 kg)  02/05/13 175 lb (79.379 kg)    Usual Body Weight: 175   % Usual Body Weight: 94%   BMI:  Body mass index is 23.76 kg/(m^2).  Estimated Nutritional Needs: Kcal: 1600 - 1800 Protein: 70- 80 g  Fluid: >/= 1.5 L   Skin: Closed incision right groin   Diet Order: General  EDUCATION NEEDS: -No education needs identified at this time   Intake/Output Summary (Last 24 hours) at 10/30/13 1033 Last data filed at 10/30/13 0730  Gross per 24 hour  Intake 1121.67 ml  Output    450 ml  Net 671.67 ml    Last BM: 4/21   Labs:   Recent Labs Lab 10/27/13 0910 10/27/13 2221 10/28/13 1410  NA 142 144 145  K 4.5 4.1 3.7  CL 112  115* 117*  CO2 20 20 18*  BUN 70* 65* 61*  CREATININE 0.84 0.83 0.77  CALCIUM 7.2* 7.7* 7.6*  GLUCOSE 169* 161* 137*    CBG (last 3)   Recent Labs  10/28/13 0740 10/29/13 0806 10/30/13 0612  GLUCAP 147* 113* 104*    Scheduled Meds: . antiseptic oral rinse  15 mL Mouth Rinse BID PC  . pantoprazole (PROTONIX) IV  40 mg Intravenous Q12H  . sodium chloride  3 mL Intravenous Q12H  . sucralfate  1 g Oral TID WC & HS    Continuous Infusions: . sodium chloride 20 mL/hr at 10/29/13 1124  . dextrose 5 % and 0.45% NaCl 50 mL/hr at 10/29/13 2154    Past Medical History  Diagnosis Date  . History of pulmonary embolus (PE)     APRIL 2011--  MULTIPLE PE'S AND DVT  . BPH (benign prostatic hypertrophy)   . History of urinary retention    . History of transurethral destruction of bladder lesion   . H/O asbestos exposure   . History of DVT (deep vein thrombosis)     APRIL 2011--  UPPER AND LOWER EXTREMITIES  . Bladder tumor     recurrent  . Nocturia   . Heart murmur, systolic     enlarged  . Bladder cancer 10/20/12    high grade papillary/invasive urothelial  . Hx of radiation therapy 01/02/13- 02/19/13    subtotal bladder 4500 cGy 25 sessions, boost 1600 cGy 8 sessions  . Skin cancer 03/2013    left cheek  . Asbestosis   . Fracture   . Hx of radiation therapy 01/02/13- 02/19/13    subtotal bladder 4500 cGy 25 sessions, reduced field boost 1600 cGy 8 sessions    Past Surgical History  Procedure Laterality Date  . Repair recurrent left inguinal hernia  06-01-1999    x 2  . Transurethral resection of prostate  1999   &  11-17-2000  . Cysto/ eua/ placement suprapubic tube  01-13-2000  . Transurethral resection of bladder tumor  06-30-2010  . Transurethral resection of prostate  07-31-2010    AND BLADDER RESECTION BLADDER TUMOR  . Vena cava filter placement  APRIL 2011    INFERIOR  . Transthoracic echocardiogram  10-11-2012   DR TILLEY    CONCENTRIC LVH WITH NORMAL LVSF/ EF 51%/ GRADE I DIASTOLIC DYSFUNCTION  . Transurethral resection of bladder tumor N/A 10/20/2012    Procedure: TRANSURETHRAL RESECTION OF BLADDER TUMOR (TURBT);  Surgeon: Hanley Ben, MD;  Location: [facility];  Service: Urology;  Laterality: N/A;  . Cystoscopy N/A 10/20/2012    Procedure: CYSTOSCOPY;  Surgeon: Hanley Ben, MD;  Location: [facility];  Service: Urology;  Laterality: N/A;  . Cataract surgery      BILAT. EYES  . Mohs surgery  03/2013    left cheek  . Hip arthroplasty Left 05/01/2013    Procedure: ARTHROPLASTY BIPOLAR HIP;  Surgeon: Renette Butters, MD;  Location: [place];  Service: Orthopedics;  Laterality: Left;  . Esophagogastroduodenoscopy N/A 10/27/2013    Procedure: ESOPHAGOGASTRODUODENOSCOPY  (EGD);  Surgeon: Beryle Beams, MD;  Location: [facility] ENDOSCOPY;  Service: Endoscopy;  Laterality: N/A;    Carrolyn Leigh, BS Dietetic Intern Pager: [phone]

## 2013-10-30 NOTE — Progress Notes (Addendum)
Clinical Social Work Department CLINICAL SOCIAL WORK PLACEMENT NOTE 10/30/2013  Patient:  Omar [name], Omar [name]  Account Number:  [id number] [place] date:  10/26/2013  Clinical Social Worker:  Megan Salon  Date/time:  10/30/2013 03:10 PM  Clinical Social Work is seeking post-discharge placement for this patient at the following level of care:   SKILLED NURSING   (*CSW will update this form in Epic as items are completed)   10/30/2013  Patient/family provided with [place] Department of Clinical Social Work's list of facilities offering this level of care within the geographic area requested by the patient (or if unable, by the patient's family).  10/30/2013  Patient/family informed of their freedom to choose among providers that offer the needed level of care, that participate in Medicare, Medicaid or managed care program needed by the patient, have an available bed and are willing to accept the patient.  10/30/2013  Patient/family informed of [facility]' ownership interest in [facility], as well as of the fact that they are under no obligation to receive care at this facility.  PASARR submitted to EDS on Previous PASRR PASARR number received from EDS on Previous PASRR  FL2 transmitted to all facilities in geographic area requested by pt/family on  10/30/2013 FL2 transmitted to all facilities within larger geographic area on   Patient informed that his/her managed care company has contracts with or will negotiate with  certain facilities, including the following:     Patient/family informed of bed offers received: 10/31/2013   Patient chooses bed at  [facility] Physician recommends and patient chooses bed at    Patient to be transferred to [place] on  10/31/2013  (JS) Patient to be transferred to facility by [facility]  The following physician request were entered in Epic:   Additional Comments:  Jeanette Caprice, MSW,  [place]

## 2013-10-30 NOTE — Progress Notes (Addendum)
Subjective: Hgb stable over last 24 hours 7.3-7.5. However per nursing patient had a bloody BM this AM.  No other bloody or black stools overnight. Patient with some bradycardia on telemetry, denies symptoms overnight. No complaints.  Objective: Vital signs in last 24 hours: Filed Vitals:   10/29/13 2012 10/30/13 0500 10/30/13 0520 10/30/13 0612  BP: 104/41  99/42 105/58  Pulse: 62  72   Temp: 97.4 F (36.3 C)  97.4 F (36.3 C)   TempSrc: Oral  Oral   Resp: 20  20   Height:      Weight:  165 lb 9.1 oz (75.1 kg)    SpO2: 99%  98%    Weight change: 2 lb 10.3 oz (1.2 kg)  Intake/Output Summary (Last 24 hours) at 10/30/13 0720 Last data filed at 10/30/13 0600  Gross per 24 hour  Intake 881.67 ml  Output    450 ml  Net 431.67 ml   General: resting in bed, NAD, very pleasant HEENT: EOMI, no scleral icterus Cardiac: ELF,8/1 systolic murmur Pulm: CTA b/l Abd: soft, nontender, nondistended, BS present Ext: warm and well perfused, no pedal edema  Lab Results: Basic Metabolic Panel:  Recent Labs Lab 10/27/13 2221 10/28/13 1410  NA 144 145  K 4.1 3.7  CL 115* 117*  CO2 20 18*  GLUCOSE 161* 137*  BUN 65* 61*  CREATININE 0.83 0.77  CALCIUM 7.7* 7.6*   Liver Function Tests:  Recent Labs Lab 10/27/13 0910  AST 10  ALT <5  ALKPHOS 43  BILITOT 0.6  PROT 3.6*  ALBUMIN 1.6*   CBC:  Recent Labs Lab 10/26/13 0900  10/29/13 1520 10/29/13 2357  WBC 10.9*  < > 9.0 8.9  NEUTROABS 10.0*  --   --   --   HGB 6.2*  < > 7.5* 7.3*  HCT 18.8*  < > 21.5* 21.0*  MCV 82.1  < > 85.3 85.4  PLT 228  < > 130* 124*  < > = values in this interval not displayed. Cardiac Enzymes:  Recent Labs Lab 10/26/13 2130 10/27/13 0910  TROPONINI <0.30 <0.30   CBG:  Recent Labs Lab 10/26/13 1508 10/27/13 0745 10/27/13 1700 10/28/13 0740 10/29/13 0806 10/30/13 0612  GLUCAP 145* 167* 172* 147* 113* 104*   Coagulation:  Recent Labs Lab 10/26/13 2130 10/29/13 0125    LABPROT 14.8 15.3*  INR 1.19 1.24   Urine Drug Screen: Drugs of Abuse     Component Value Date/Time   LABOPIA NONE DETECTED 10/26/2013 1400   COCAINSCRNUR NONE DETECTED 10/26/2013 1400   LABBENZ NONE DETECTED 10/26/2013 1400   AMPHETMU NONE DETECTED 10/26/2013 1400   THCU NONE DETECTED 10/26/2013 1400   LABBARB NONE DETECTED 10/26/2013 1400    Alcohol Level: No results found for this basename: ETH,  in the last 168 hours Urinalysis:  Recent Labs Lab 10/26/13 1400  COLORURINE YELLOW  LABSPEC 1.022  PHURINE 5.0  GLUCOSEU NEGATIVE  HGBUR LARGE*  BILIRUBINUR NEGATIVE  KETONESUR NEGATIVE  PROTEINUR 30*  UROBILINOGEN 0.2  NITRITE NEGATIVE  LEUKOCYTESUR LARGE*    Micro Results: Recent Results (from the past 240 hour(s))  URINE CULTURE     Status: None   Collection Time    10/26/13  2:00 PM      Result Value Ref Range Status   Specimen Description URINE, CLEAN CATCH   Final   Special Requests NONE   Final   Culture  Setup Time     Final   Value: 10/26/2013  19:19     Performed at [place]     Final   Value: 95,000 COLONIES/ML     Performed at [facility]   Culture     Final   Value: ENTEROCOCCUS SPECIES     Performed at [facility]   Report Status 10/28/2013 FINAL   Final   Organism ID, Bacteria ENTEROCOCCUS SPECIES   Final  MRSA PCR SCREENING     Status: None   Collection Time    10/26/13  3:20 PM      Result Value Ref Range Status   MRSA by PCR NEGATIVE  NEGATIVE Final   Comment:            The GeneXpert MRSA Assay (FDA     approved for NASAL specimens     only), is one component of a     comprehensive MRSA colonization     surveillance program. It is not     intended to diagnose MRSA     infection nor to guide or     monitor treatment for     MRSA infections.   Studies/Results: No results found. Medications: I have reviewed the patient's current medications. Scheduled Meds: . antiseptic oral rinse  15 mL Mouth  Rinse BID  . pantoprazole (PROTONIX) IV  40 mg Intravenous Q12H  . sodium chloride  3 mL Intravenous Q12H  . sucralfate  1 g Oral TID WC & HS   Continuous Infusions: . sodium chloride 20 mL/hr at 10/29/13 1124  . dextrose 5 % and 0.45% NaCl 50 mL/hr at 10/29/13 2154   PRN Meds:.acetaminophen, morphine injection Assessment/Plan: Symptomatic acute blood anemia 2/2 bleeding duodenal ulcer - Hgb stable overnight however patient with bloody BM this AM. - Continue following CBC q12 - GI following - protnix PO BID -Carafate  Asymptomatic Bradycardia -Noted on tele  Hemorrhagic shock 2/2 to active GIB--now resolved.  -Vitals stable overnight    Cancer of dome of urinary bladder  -Outpatient follow up with Dr. Alen Blew  Deconditioning -Pt eval today- recommends SNF, family wants patient home will have discussion to decide.  Dispo: Disposition is deferred at this time, awaiting improvement of current medical problems.  Likely discharge tomorrow  The patient does have a current PCP Redmond Pulling Arna Medici, MD) and does not need an [facility] hospital follow-up appointment after discharge.  The patient does not have transportation limitations that hinder transportation to clinic appointments.  .Services Needed at time of discharge: Y = Yes, Blank = No PT:   OT:   RN:   Equipment:   Other:     LOS: 4 days   Joni Reining, DO 10/30/2013, 7:20 AM

## 2013-10-30 NOTE — Progress Notes (Signed)
  Date: 10/30/2013  Patient name: Omar [name]  Medical record number: [id number]  Date of birth: [birth year]   This patient has been seen and the plan of care was discussed with the house staff. Please see their note for complete details. I concur with their findings with the following additions/corrections: Bloody BM this morning. Hgb overall stable. Agree with continuing to follow H/H, BID protonix. Overall he is clinically improved and hemodynamically stable.  Dominic Pea, DO, [facility] 10/30/2013, 11:56 AM

## 2013-10-30 NOTE — Progress Notes (Signed)
Clinical Social Work Department BRIEF PSYCHOSOCIAL ASSESSMENT 10/30/2013  Patient:  Omar [name], Omar [name]     Account Number:  [id number]     Admit date:  10/26/2013  Clinical Social Worker:  Megan Salon  Date/Time:  10/30/2013 03:04 PM  Referred by:  CSW  Date Referred:  10/30/2013 Referred for  SNF Placement   Other Referral:   Interview type:  Other - See comment Other interview type:   CSW spoke to patient and patient's son Francee Piccolo by bedside    PSYCHOSOCIAL DATA Living Status:  FAMILY Admitted from facility:   Level of care:   Primary support name:  Josel Keo Primary support relationship to patient:  CHILD, ADULT Degree of support available:   Supportive family    CURRENT CONCERNS Current Concerns  Post-Acute Placement   Other Concerns:    SOCIAL WORK ASSESSMENT / PLAN CSW reviewed chart and noticed PT's recommendation for SNF placement if family is not able to provide 24 hour care. CSW went into room and introduced self and explained reason for visit. Patient's son Francee Piccolo was at bedside. CSW explained PT's recommendation and the SNF process. Francee Piccolo stated that patient has 24 hour care at home and is not quite sure if they want SNF at this time because "patient wishes to go back home." Francee Piccolo stated he is open to [employer] [facility] faxing information out, but would like to talk to family to see which dc plan they want, SNF vs HH. Patient stated he would rather go home. Patient's son states that a family member has been to [facility] before and that would be the preference if they chose SNF. Patient's son would like [name] (pther son)  to make the decision along with patient. CSW provided contact information if  they had further questions. CSW stated she will follow up with patient and family tomorrow morning.   Assessment/plan status:  Psychosocial Support/Ongoing Assessment of Needs Other assessment/ plan:   Information/referral to community resources:   SNF list/CSW contact  information    PATIENT'S/FAMILY'S RESPONSE TO PLAN OF CARE: Patient states he would rather go home and family is able to provide care. Patient's son states they will think about the option of SNF and let social worker know.        Jeanette Caprice, MSW, [place]

## 2013-10-30 NOTE — Care Management Note (Unsigned)
    Page 1 of 1   10/30/2013     3:54:28 PM CARE MANAGEMENT NOTE 10/30/2013  Patient:  JENNIFER, HOLLAND R   Account Number:  [id number]  Date Initiated:  10/30/2013  Documentation initiated by:  Shamra Bradeen  Subjective/Objective Assessment:   Pt adm on 4/17 with symptomatic anemia, bradycardia and shock.  PTA, pt resides at home and is cared for by his 2 sons.     Action/Plan:   P.T. eval today; recommending SNF if family unable to provide 24h care at dc.  CSW following for family's decision, as they are to discuss this and get back to her. Will follow progress.   Anticipated DC Date:  11/02/2013   Anticipated DC Plan:  SKILLED NURSING FACILITY  In-house referral  Clinical Social Worker      DC Planning Services  CM consult      Choice offered to / List presented to:             Status of service:  In process, will continue to follow Medicare Important Message given?   (If response is "NO", the following Medicare IM given date fields will be blank) Date Medicare IM given:   Date Additional Medicare IM given:    Discharge Disposition:    Per UR Regulation:  Reviewed for med. necessity/level of care/duration of stay  If discussed at [place] of Stay Meetings, dates discussed:    Comments:

## 2013-10-31 DIAGNOSIS — I440 Atrioventricular block, first degree: Secondary | ICD-10-CM

## 2013-10-31 LAB — CBC
HCT: 22.2 % — ABNORMAL LOW (ref 39.0–52.0)
Hemoglobin: 7.6 g/dL — ABNORMAL LOW (ref 13.0–17.0)
MCH: 29.9 pg (ref 26.0–34.0)
MCHC: 34.2 g/dL (ref 30.0–36.0)
MCV: 87.4 fL (ref 78.0–100.0)
PLATELETS: 146 10*3/uL — AB (ref 150–400)
RBC: 2.54 MIL/uL — ABNORMAL LOW (ref 4.22–5.81)
RDW: 16.2 % — ABNORMAL HIGH (ref 11.5–15.5)
WBC: 8 10*3/uL (ref 4.0–10.5)

## 2013-10-31 LAB — GLUCOSE, CAPILLARY: Glucose-Capillary: 108 mg/dL — ABNORMAL HIGH (ref 70–99)

## 2013-10-31 MED ORDER — NITROFURANTOIN MONOHYD MACRO 100 MG PO CAPS: 100.0000 mg | ORAL_CAPSULE | Freq: Two times a day (BID) | ORAL | Status: AC

## 2013-10-31 MED ORDER — NITROFURANTOIN MONOHYD MACRO 100 MG PO CAPS
100.0000 mg | ORAL_CAPSULE | Freq: Two times a day (BID) | ORAL | Status: DC
  Administered 2013-10-31: 100 mg via ORAL
  Filled 2013-10-31 (×2): qty 1

## 2013-10-31 MED ORDER — ENSURE COMPLETE PO LIQD: 237.0000 mL | Freq: Every day | ORAL | Status: DC

## 2013-10-31 MED ORDER — ACETAMINOPHEN 500 MG PO TABS: 500.0000 mg | ORAL_TABLET | Freq: Four times a day (QID) | ORAL | Status: AC | PRN

## 2013-10-31 MED ORDER — SUCRALFATE 1 G PO TABS: 1.0000 g | ORAL_TABLET | Freq: Three times a day (TID) | ORAL | Status: DC

## 2013-10-31 MED ORDER — PANTOPRAZOLE SODIUM 40 MG PO TBEC: 40.0000 mg | DELAYED_RELEASE_TABLET | Freq: Two times a day (BID) | ORAL | Status: DC

## 2013-10-31 NOTE — Progress Notes (Signed)
I agree with the Student-Dietitian note and made appropriate revisions.  Katie Adajah Cocking, RD, LDN Pager #: [phone] After-Hours Pager #: [phone]  

## 2013-10-31 NOTE — Progress Notes (Signed)
Subjective: Hgb stable No bloody BM overnight No complaints. Currently PT recommending SNF, sons will decided on SNF versus HH today after they watch patient would with PT today.  Objective: Vital signs in last 24 hours: Filed Vitals:   10/30/13 1323 10/30/13 1329 10/30/13 2100 10/31/13 0546  BP: 106/43 107/38 98/45 125/67  Pulse: 68  71 69  Temp: 98.2 F (36.8 C)  97.8 F (36.6 C) 98 F (36.7 C)  TempSrc: Oral  Oral Oral  Resp:  18 18 18   Height:      Weight:    167 lb 8.8 oz (76 kg)  SpO2: 90%  97% 94%   Weight change: 1 lb 15.7 oz (0.9 kg)  Intake/Output Summary (Last 24 hours) at 10/31/13 1133 Last data filed at 10/31/13 0730  Gross per 24 hour  Intake    240 ml  Output    600 ml  Net   -360 ml   General: resting in bed, NAD HEENT: EOMI, no scleral icterus Cardiac: UYQ,0/3 systolic murmur Pulm: CTA b/l Abd: soft, nontender, nondistended, BS present Ext: warm and well perfused, no pedal edema  Lab Results: Basic Metabolic Panel:  Recent Labs Lab 10/27/13 2221 10/28/13 1410  NA 144 145  K 4.1 3.7  CL 115* 117*  CO2 20 18*  GLUCOSE 161* 137*  BUN 65* 61*  CREATININE 0.83 0.77  CALCIUM 7.7* 7.6*   Liver Function Tests:  Recent Labs Lab 10/27/13 0910  AST 10  ALT <5  ALKPHOS 43  BILITOT 0.6  PROT 3.6*  ALBUMIN 1.6*   CBC:  Recent Labs Lab 10/26/13 0900  10/30/13 2051 10/31/13 0925  WBC 10.9*  < > 7.6 8.0  NEUTROABS 10.0*  --   --   --   HGB 6.2*  < > 7.3* 7.6*  HCT 18.8*  < > 21.5* 22.2*  MCV 82.1  < > 86.7 87.4  PLT 228  < > 147* 146*  < > = values in this interval not displayed. Cardiac Enzymes:  Recent Labs Lab 10/26/13 2130 10/27/13 0910  TROPONINI <0.30 <0.30   CBG:  Recent Labs Lab 10/27/13 1700 10/28/13 0740 10/29/13 0806 10/30/13 0612 10/30/13 1128 10/31/13 0616  GLUCAP 172* 147* 113* 104* 126* 108*   Coagulation:  Recent Labs Lab 10/26/13 2130 10/29/13 0125  LABPROT 14.8 15.3*  INR 1.19 1.24    Urine Drug Screen: Drugs of Abuse     Component Value Date/Time   LABOPIA NONE DETECTED 10/26/2013 1400   COCAINSCRNUR NONE DETECTED 10/26/2013 1400   LABBENZ NONE DETECTED 10/26/2013 1400   AMPHETMU NONE DETECTED 10/26/2013 1400   THCU NONE DETECTED 10/26/2013 1400   LABBARB NONE DETECTED 10/26/2013 1400    Alcohol Level: No results found for this basename: ETH,  in the last 168 hours Urinalysis:  Recent Labs Lab 10/26/13 1400  COLORURINE YELLOW  LABSPEC 1.022  PHURINE 5.0  GLUCOSEU NEGATIVE  HGBUR LARGE*  BILIRUBINUR NEGATIVE  KETONESUR NEGATIVE  PROTEINUR 30*  UROBILINOGEN 0.2  NITRITE NEGATIVE  LEUKOCYTESUR LARGE*    Micro Results: Recent Results (from the past 240 hour(s))  URINE CULTURE     Status: None   Collection Time    10/26/13  2:00 PM      Result Value Ref Range Status   Specimen Description URINE, CLEAN CATCH   Final   Special Requests NONE   Final   Culture  Setup Time     Final   Value: 10/26/2013 19:19  Performed at [place]     Final   Value: 95,000 COLONIES/ML     Performed at [facility]   Culture     Final   Value: ENTEROCOCCUS SPECIES     Performed at [facility]   Report Status 10/28/2013 FINAL   Final   Organism ID, Bacteria ENTEROCOCCUS SPECIES   Final  MRSA PCR SCREENING     Status: None   Collection Time    10/26/13  3:20 PM      Result Value Ref Range Status   MRSA by PCR NEGATIVE  NEGATIVE Final   Comment:            The GeneXpert MRSA Assay (FDA     approved for NASAL specimens     only), is one component of a     comprehensive MRSA colonization     surveillance program. It is not     intended to diagnose MRSA     infection nor to guide or     monitor treatment for     MRSA infections.   Studies/Results: No results found. Medications: I have reviewed the patient's current medications. Scheduled Meds: . antiseptic oral rinse  15 mL Mouth Rinse BID PC  . feeding supplement  (ENSURE COMPLETE)  237 mL Oral Q lunch  . pantoprazole  40 mg Oral BID  . sodium chloride  3 mL Intravenous Q12H  . sucralfate  1 g Oral TID WC & HS   Continuous Infusions: . sodium chloride 10 mL/hr at 10/30/13 1828   PRN Meds:.acetaminophen, morphine injection Assessment/Plan: Symptomatic acute blood anemia 2/2 bleeding duodenal ulcer - Hgb stable overnight currently 7.6 - Patient stable for discharge>>> SNF vs Home with HH - Continue Protonix 40mg  BID - Avoid NSAIDs  Asymptomatic Bradycardia with 1st degree AV block -Noted on tele  Hemorrhagic shock 2/2 to active GIB--now resolved.  -Vitals stable overnight    Cancer of dome of urinary bladder  -Outpatient follow up with Dr. Alen Blew  Deconditioning -Pt evrecommends SNF  Dispo: Disposition is deferred at this time, awaiting improvement of current medical problems.  Discharge to SNF vs [facility] w PT pending decesion of son.  The patient does have a current PCP Redmond Pulling Arna Medici, MD) and does not need an [facility] hospital follow-up appointment after discharge.  The patient does not have transportation limitations that hinder transportation to clinic appointments.  .Services Needed at time of discharge: Y = Yes, Blank = No PT: y  OT:   RN:   Equipment:   Other:     LOS: 5 days   Joni Reining, DO 10/31/2013, 11:33 AM

## 2013-10-31 NOTE — Discharge Summary (Signed)
  Date: 10/31/2013  Patient name: Omar [name]  Medical record number: [id number]  Date of birth: 1915/07/16   This patient has been seen and the plan of care was discussed with the house staff. Please see their note for complete details. I concur with their findings and plan. He is hemodynamically stable. Would treat UTI with macrobid as you are doing. Educated to stop Mobic and avoid all NSAIDs. He understood this. Continue BID PPI.  Dominic Pea, DO, [facility] 10/31/2013, 1:56 PM

## 2013-10-31 NOTE — Progress Notes (Signed)
Physical Therapy Treatment Patient Details Name: Omar [name] MRN: [id number] DOB: Jul 08, 1916 Today's Date: Nov 01, 2013    History of Present Illness Pt adm with GIB and underwent embolization 4/18. Pt with hx of hip fx, bladder CA, BPH, DVT    PT Comments    After seeing pt's current level of function, son does feel that SNF is best option.  Follow Up Recommendations  SNF     Equipment Recommendations  None recommended by PT    Recommendations for Other Services       Precautions / Restrictions Precautions Precautions: Fall    Mobility  Bed Mobility Overal bed mobility: Needs Assistance Bed Mobility: Supine to Sit     Supine to sit: Max assist     General bed mobility comments: A to get legs off of bed and to get hand on rail.  Transfers Overall transfer level: Needs assistance Equipment used: Rolling walker (2 wheeled) Transfers: Sit to/from [facility] Sit to Stand: +2 physical assistance;Mod assist Stand pivot transfers: +2 physical assistance;Mod assist       General transfer comment: stood from elevated bed. Pt with flexed hips/knees.  Difficulty with transitioning hand from bed to RW.  Ambulation/Gait                 Stairs            Wheelchair Mobility    Modified Rankin (Stroke Patients Only)       Balance     Sitting balance-Leahy Scale: Fair       Standing balance-Leahy Scale: Poor                      Cognition Arousal/Alertness: Awake/alert Behavior During Therapy: WFL for tasks assessed/performed Overall Cognitive Status: Difficult to assess                      Exercises      General Comments        Pertinent Vitals/Pain No complaints of pain    Home Living Family/patient expects to be discharged to:: Unsure Living Arrangements: Children ("sons take turns staying w/him")                  Prior Function            PT Goals (current goals can now be found  in the care plan section) Progress towards PT goals: Progressing toward goals    Frequency  Min 3X/week    PT Plan Current plan remains appropriate    Co-evaluation             End of Session Equipment Utilized During Treatment: Gait belt;Oxygen Activity Tolerance: Patient limited by fatigue Patient left: in chair;with call bell/phone within reach;with chair alarm set     Time: 3810-1751 PT Time Calculation (min): 19 min  Charges:  $Therapeutic Activity: 8-22 mins                    G Codes:      Galen Manila 11-01-13, 12:21 PM

## 2013-10-31 NOTE — Progress Notes (Signed)
Pt has left at this time for transport to SNF.  Patient transported by [facility].  Mindy McDonald representative picking up.

## 2013-10-31 NOTE — Discharge Summary (Signed)
Name: Omar [name] MRN: [id number] DOB: August 20, 1915 80 y.o. PCP: Leonard Downing, MD  Date of Admission: 10/26/2013  8:13 AM Date of Discharge: 10/31/2013 Attending Physician: Dominic Pea, DO  Discharge Diagnosis: Principal Problem:   Acute duodenal ulcer with bleeding Active Problems:   Cancer of dome of urinary bladder   Acute blood loss anemia   Symptomatic anemia   Hypotension, unspecified   Hemorrhagic shock  Discharge Medications:   Medication List    ASK your doctor about these medications       acetaminophen 500 MG tablet  Commonly known as:  TYLENOL  Take 500 mg by mouth every 6 (six) hours as needed.     aspirin EC 325 MG tablet  Take 1 tablet (325 mg total) by mouth daily.     CRANBERRY JUICE EXTRACT PO  Take 2 capsules by mouth daily.     finasteride 5 MG tablet  Commonly known as:  PROSCAR  Take 5 mg by mouth daily.     Fish Oil 1000 MG Caps  Take by mouth daily.     furosemide 40 MG tablet  Commonly known as:  LASIX  Take 40 mg by mouth daily.     magnesium hydroxide 400 MG/5ML suspension  Commonly known as:  MILK OF MAGNESIA  Take by mouth daily as needed for mild constipation.     meloxicam 15 MG tablet  Commonly known as:  MOBIC  Take 15 mg by mouth daily.        Disposition and follow-up:   Mr.Omar [name] was discharged from [facility] in Stable condition.  At the hospital follow up visit please address:  1.  Functional status, Anemia, Patient found to have pyuria on U/A, found to be 95k colonies of Entercoccus, patient instructed to complete 7 day course of Macrobid 4/22- 4/29  2.  Labs / imaging needed at time of follow-up: CBC  3.  Pending labs/ test needing follow-up: None  Follow-up Appointments:   Discharge Instructions:      Future Appointments Provider Department Dept Phone   01/23/2014 2:30 PM [facility] [phone]   01/23/2014 3:00  PM Wyatt Portela, MD [facility] [phone]      Consultations: Treatment Team:  Beryle Beams, MD Alexis Frock, MD  Procedures Performed:  Ct Abdomen Pelvis Wo Contrast  10/27/2013   CLINICAL DATA:  Low hemoglobin.  Question intra-abdominal bleed.  EXAM: CT ABDOMEN AND PELVIS WITHOUT CONTRAST  TECHNIQUE: Multidetector CT imaging of the abdomen and pelvis was performed following the standard protocol without intravenous contrast.  COMPARISON:  CT ABD/PELVIS W CM dated 04/24/2013  FINDINGS: Numerous bilateral calcified pleural plaques. Mild cardiomegaly. No confluent airspace opacities or effusions.  Small layering gallstones within the gallbladder. Liver, pancreas, spleen, adrenals and kidneys have an unremarkable unenhanced appearance. IVC filter is noted in the infrarenal IVC. Aortic and iliac calcifications without aneurysm.  There is prostate enlargement. There is beam hardening artifact from a left hip replacement which obscures lower pelvic structures. Previously seen large posterior bladder wall diverticulum is partially obscured by the beam hardening artifact. 9 mm calcification in the left side of the pelvis was shown on prior CT to be within this large diverticulum.  No evidence of retroperitoneal hematoma. No free fluid, free air or adenopathy. Stomach, large and small bowel are grossly unremarkable.  Diffuse degenerative changes throughout the thoracolumbar spine. No acute bony  abnormality.  IMPRESSION: No retroperitoneal or intra-abdominal hemorrhage.  Cholelithiasis.  Markedly enlarged prostate. Large posterior bladder wall diverticulum noted on prior CT partially obscured by left hip replacement.  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   Electronically Signed   By: Rolm Baptise M.D.   On: 10/27/2013 01:30   Dg Chest 2 View  10/26/2013   CLINICAL DATA:  Weakness  EXAM: CHEST  2 VIEW  COMPARISON:  DG  CHEST 1 VIEW dated 04/30/2013  FINDINGS: Calcified pleural plaques are reidentified. Healed left clavicular fracture. Heart size is mildly enlarged without evidence for edema. No new pulmonary parenchymal opacity. No pleural effusion.  IMPRESSION: No new acute cardiopulmonary process.  Evidence of asbestos exposure reidentified.   Electronically Signed   By: Conchita Paris M.D.   On: 10/26/2013 09:36   Ir Angiogram Visceral Selective  10/27/2013   CLINICAL DATA:  80 year old male with acute anemia secondary to active upper GI bleed. On upper endoscopy he was found have not actively bleeding duodenum ulcer. A clip was placed but this was insufficient to control the hemorrhage. The patient was taken urgently to interventional Radiology for angiogram and embolization.  EXAM: SELECTIVE VISCERAL ARTERIOGRAPHY; ARTERIOGRAPHY; IR EMBO ART VEN HEMORR LYMPH EXTRAV INC GUIDE ROADMAPPING; IR ULTRASOUND GUIDANCE VASC ACCESS RIGHT  Date: 10/27/2013  PROCEDURE: 1. Ultrasound-guided puncture of the right common femoral artery 2. Catheterization of celiac artery with arteriogram 3. Catheterization of the common hepatic artery with arteriogram 4. Catheterization of the gastroduodenal artery with arteriogram 5. Catheterization of a super duodenum branch of the gastroduodenal artery with arteriogram 6. Coil embolization of the supra duodenal branch 7. Coil embolization of the gastroduodenal artery 8. Catheterization of the superior mesenteric artery with arteriogram 9. Limited right common femoral arteriogram 10. Application of a Mynx percutaneous arterial closure device Interventional Radiologist:  Criselda Peaches, MD  ANESTHESIA/SEDATION: Moderate (conscious) sedation was used. 4 mg Versed, 50 mcg Fentanyl were administered intravenously. The patient's vital signs were monitored continuously by radiology nursing throughout the procedure.  Sedation Time: 75 minutes  FLUOROSCOPY TIME:  27 min 24 seconds  CONTRAST:  59mL  OMNIPAQUE IOHEXOL 300 MG/ML  SOLN  TECHNIQUE: Informed consent was obtained from the patient following explanation of the procedure, risks, benefits and alternatives. The patient understands, agrees and consents for the procedure. All questions were addressed. A time out was performed.  Maximal barrier sterile technique utilized including caps, mask, sterile gowns, sterile gloves, large sterile drape, hand hygiene, and Betadine skin prep.  The right groin was interrogated with ultrasound. The common femoral artery was found to be widely patent. Local anesthesia was attained by infiltration with 1% lidocaine. A small dermatotomy was made. Under real-time sonographic guidance, the common femoral artery was punctured with a 21 gauge micropuncture needle. Image was obtained and stored for the medical record. Using a transitional 5 French micro sheath, a Bentson wire was advanced of the abdominal aorta. A C2 Cobra catheter was advanced to the aorta. The celiac artery was selected and an arteriogram attempted. However, the C2 catheter achieved insufficient purchase. Therefore, the C2 catheter was exchanged for a Mickelson catheter. The Mickelson catheter was secured is slightly more secure early and celiac artery origin. The celiac arteriogram was performed. There is is relatively conventional hepatic arterial anatomy. The gastroduodenal artery is relatively identified.  Attempts were made to advance the 5 French catheter into the common hepatic artery, however this was unsuccessful secondary to stenosis of the celiac  artery. A Renegade ST micro catheter was then navigated into the gastroduodenal artery. Gastroduodenal arteriogram was performed. No definite active hemorrhage is identified. There is a supraduodenal branch off the proximal GDA. This was catheterized and an arteriogram performed. Again, no active arterial bleeding. However, the vessels are in the immediate vicinity of the endoscopically placed clips. The  decision was made to proceed with coil embolization of the supraduodenal arterial branch and gastroduodenal artery to decrease the pressure head and reduce the risk and severity of recurrent bleeding. The supraduodenal branch was coil embolized using first a 2 x 4 x 41 mm interlock diamond coil followed by a 3 x 120 mm interlock coil. The micro catheter was then advanced into the gastroduodenal artery at its junction with the right gastric artery. The vessel was then coil embolized using first a 6 x 100 mm interlock coil followed by a single 5 x 80 mm coil and two 5 x 150 mm coils. The vessel was embolized nearly to the origin. The micro catheter was pulled back into the common hepatic artery and a post embolization arteriogram was performed confirming no further flow into the GDA or supraduodenal branch vessels.  The micro catheter was removed. The Mickelson catheter was navigated in this SMA. A superior mesenteric arteriogram was performed. No definite vascularity in the region of the clips. No retrograde filling of the coil packs. The catheter was removed.  Through the access sheath, a limited right common femoral arteriogram was performed. The access is within the common femoral artery above the bifurcation below the inferior genu of the inferior epigastric artery. The groin was re-prepped with chlorhexidine. 2 g Ancef was administered intravenously. Arterial hemostasis was achieved with the assistance of a Mynx Ace arterial closure device.  IMPRESSION: 1. Successful coil embolization of the gastroduodenal artery and proximal supraduodenal branch in the region of the endoscopically placed clips. 2. No definite active arterial hemorrhage was identified during the angiogram. 3. The patient received an additional unit of blood in interventional radiology in addition to the 1 unit received in endoscopy for a total of 2 units during his procedural course this afternoon. 4. By the end the procedure, the patient had  attained hemodynamic stability.  Signed,  Criselda Peaches, MD  [facility]  [facility]   Electronically Signed   By: Jacqulynn Cadet M.D.   On: 10/27/2013 17:07   Ir Angiogram Visceral Selective  10/27/2013   CLINICAL DATA:  80 year old male with acute anemia secondary to active upper GI bleed. On upper endoscopy he was found have not actively bleeding duodenum ulcer. A clip was placed but this was insufficient to control the hemorrhage. The patient was taken urgently to interventional Radiology for angiogram and embolization.  EXAM: SELECTIVE VISCERAL ARTERIOGRAPHY; ARTERIOGRAPHY; IR EMBO ART VEN HEMORR LYMPH EXTRAV INC GUIDE ROADMAPPING; IR ULTRASOUND GUIDANCE VASC ACCESS RIGHT  Date: 10/27/2013  PROCEDURE: 1. Ultrasound-guided puncture of the right common femoral artery 2. Catheterization of celiac artery with arteriogram 3. Catheterization of the common hepatic artery with arteriogram 4. Catheterization of the gastroduodenal artery with arteriogram 5. Catheterization of a super duodenum branch of the gastroduodenal artery with arteriogram 6. Coil embolization of the supra duodenal branch 7. Coil embolization of the gastroduodenal artery 8. Catheterization of the superior mesenteric artery with arteriogram 9. Limited right common femoral arteriogram 10. Application of a Mynx percutaneous arterial closure device Interventional Radiologist:  Criselda Peaches, MD  ANESTHESIA/SEDATION: Moderate (conscious) sedation was used. 4 mg  Versed, 50 mcg Fentanyl were administered intravenously. The patient's vital signs were monitored continuously by radiology nursing throughout the procedure.  Sedation Time: 75 minutes  FLUOROSCOPY TIME:  27 min 24 seconds  CONTRAST:  77mL OMNIPAQUE IOHEXOL 300 MG/ML  SOLN  TECHNIQUE: Informed consent was obtained from the patient following explanation of the procedure, risks, benefits and alternatives. The patient understands, agrees  and consents for the procedure. All questions were addressed. A time out was performed.  Maximal barrier sterile technique utilized including caps, mask, sterile gowns, sterile gloves, large sterile drape, hand hygiene, and Betadine skin prep.  The right groin was interrogated with ultrasound. The common femoral artery was found to be widely patent. Local anesthesia was attained by infiltration with 1% lidocaine. A small dermatotomy was made. Under real-time sonographic guidance, the common femoral artery was punctured with a 21 gauge micropuncture needle. Image was obtained and stored for the medical record. Using a transitional 5 French micro sheath, a Bentson wire was advanced of the abdominal aorta. A C2 Cobra catheter was advanced to the aorta. The celiac artery was selected and an arteriogram attempted. However, the C2 catheter achieved insufficient purchase. Therefore, the C2 catheter was exchanged for a Mickelson catheter. The Mickelson catheter was secured is slightly more secure early and celiac artery origin. The celiac arteriogram was performed. There is is relatively conventional hepatic arterial anatomy. The gastroduodenal artery is relatively identified.  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 A Renegade ST micro catheter was then navigated into the gastroduodenal artery. Gastroduodenal arteriogram was performed. No definite active hemorrhage is identified. There is a supraduodenal branch off the proximal GDA. This was catheterized and an arteriogram performed. Again, no active arterial bleeding. However, the vessels are in the immediate vicinity of the endoscopically placed clips. The decision was made to proceed with coil embolization of the supraduodenal arterial branch and gastroduodenal artery to decrease the pressure head and reduce the risk and severity of recurrent bleeding.  The supraduodenal branch was coil embolized using first a 2 x 4 x 41 mm interlock diamond coil followed by a 3 x 120 mm interlock coil. The micro catheter was then advanced into the gastroduodenal artery at its junction with the right gastric artery. The vessel was then coil embolized using first a 6 x 100 mm interlock coil followed by a single 5 x 80 mm coil and two 5 x 150 mm coils. The vessel was embolized nearly to the origin. The micro catheter was pulled back into the common hepatic artery and a post embolization arteriogram was performed confirming no further flow into the GDA or supraduodenal branch vessels.  The micro catheter was removed. The Mickelson catheter was navigated in this SMA. A superior mesenteric arteriogram was performed. No definite vascularity in the region of the clips. No retrograde filling of the coil packs. The catheter was removed.  Through the access sheath, a limited right common femoral arteriogram was performed. The access is within the common femoral artery above the bifurcation below the inferior genu of the inferior epigastric artery. The groin was re-prepped with chlorhexidine. 2 g Ancef was administered intravenously. Arterial hemostasis was achieved with the assistance of a Mynx Ace arterial closure device.  IMPRESSION: 1. Successful coil embolization of the gastroduodenal artery and proximal supraduodenal branch in the region of the endoscopically placed clips. 2. No definite active arterial hemorrhage was identified during  the angiogram. 3. The patient received an additional unit of blood in interventional radiology in addition to the 1 unit received in endoscopy for a total of 2 units during his procedural course this afternoon. 4. By the end the procedure, the patient had attained hemodynamic stability.  Signed,  Criselda Peaches, MD  [facility]  [facility]   Electronically Signed   By: Jacqulynn Cadet M.D.   On:  10/27/2013 17:07   Ir Angiogram Follow Up Study  10/27/2013   CLINICAL DATA:  80 year old male with acute anemia secondary to active upper GI bleed. On upper endoscopy he was found have not actively bleeding duodenum ulcer. A clip was placed but this was insufficient to control the hemorrhage. The patient was taken urgently to interventional Radiology for angiogram and embolization.  EXAM: SELECTIVE VISCERAL ARTERIOGRAPHY; ARTERIOGRAPHY; IR EMBO ART VEN HEMORR LYMPH EXTRAV INC GUIDE ROADMAPPING; IR ULTRASOUND GUIDANCE VASC ACCESS RIGHT  Date: 10/27/2013  PROCEDURE: 1. Ultrasound-guided puncture of the right common femoral artery 2. Catheterization of celiac artery with arteriogram 3. Catheterization of the common hepatic artery with arteriogram 4. Catheterization of the gastroduodenal artery with arteriogram 5. Catheterization of a super duodenum branch of the gastroduodenal artery with arteriogram 6. Coil embolization of the supra duodenal branch 7. Coil embolization of the gastroduodenal artery 8. Catheterization of the superior mesenteric artery with arteriogram 9. Limited right common femoral arteriogram 10. Application of a Mynx percutaneous arterial closure device Interventional Radiologist:  Criselda Peaches, MD  ANESTHESIA/SEDATION: Moderate (conscious) sedation was used. 4 mg Versed, 50 mcg Fentanyl were administered intravenously. The patient's vital signs were monitored continuously by radiology nursing throughout the procedure.  Sedation Time: 75 minutes  FLUOROSCOPY TIME:  27 min 24 seconds  CONTRAST:  63mL OMNIPAQUE IOHEXOL 300 MG/ML  SOLN  TECHNIQUE: Informed consent was obtained from the patient following explanation of the procedure, risks, benefits and alternatives. The patient understands, agrees and consents for the procedure. All questions were addressed. A time out was performed.  Maximal barrier sterile technique utilized including caps, mask, sterile gowns, sterile gloves, large sterile  drape, hand hygiene, and Betadine skin prep.  The right groin was interrogated with ultrasound. The common femoral artery was found to be widely patent. Local anesthesia was attained by infiltration with 1% lidocaine. A small dermatotomy was made. Under real-time sonographic guidance, the common femoral artery was punctured with a 21 gauge micropuncture needle. Image was obtained and stored for the medical record. Using a transitional 5 French micro sheath, a Bentson wire was advanced of the abdominal aorta. A C2 Cobra catheter was advanced to the aorta. The celiac artery was selected and an arteriogram attempted. However, the C2 catheter achieved insufficient purchase. Therefore, the C2 catheter was exchanged for a Mickelson catheter. The Mickelson catheter was secured is slightly more secure early and celiac artery origin. The celiac arteriogram was performed. There is is relatively conventional hepatic arterial anatomy. The gastroduodenal artery is relatively identified.  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 A Renegade ST micro catheter was then navigated into the gastroduodenal artery. Gastroduodenal arteriogram was performed. No definite active hemorrhage is identified. There is a supraduodenal branch off the proximal GDA. This was catheterized and an arteriogram performed. Again, no active arterial bleeding. However, the vessels are in the immediate vicinity of the endoscopically placed clips. The decision was made to proceed  with coil embolization of the supraduodenal arterial branch and gastroduodenal artery to decrease the pressure head and reduce the risk and severity of recurrent bleeding. The supraduodenal branch was coil embolized using first a 2 x 4 x 41 mm interlock diamond coil followed by a 3 x 120 mm interlock coil. The micro catheter was then advanced into the gastroduodenal  artery at its junction with the right gastric artery. The vessel was then coil embolized using first a 6 x 100 mm interlock coil followed by a single 5 x 80 mm coil and two 5 x 150 mm coils. The vessel was embolized nearly to the origin. The micro catheter was pulled back into the common hepatic artery and a post embolization arteriogram was performed confirming no further flow into the GDA or supraduodenal branch vessels.  The micro catheter was removed. The Mickelson catheter was navigated in this SMA. A superior mesenteric arteriogram was performed. No definite vascularity in the region of the clips. No retrograde filling of the coil packs. The catheter was removed.  Through the access sheath, a limited right common femoral arteriogram was performed. The access is within the common femoral artery above the bifurcation below the inferior genu of the inferior epigastric artery. The groin was re-prepped with chlorhexidine. 2 g Ancef was administered intravenously. Arterial hemostasis was achieved with the assistance of a Mynx Ace arterial closure device.  IMPRESSION: 1. Successful coil embolization of the gastroduodenal artery and proximal supraduodenal branch in the region of the endoscopically placed clips. 2. No definite active arterial hemorrhage was identified during the angiogram. 3. The patient received an additional unit of blood in interventional radiology in addition to the 1 unit received in endoscopy for a total of 2 units during his procedural course this afternoon. 4. By the end the procedure, the patient had attained hemodynamic stability.  Signed,  Criselda Peaches, MD  [facility]  [facility]   Electronically Signed   By: Jacqulynn Cadet M.D.   On: 10/27/2013 17:07   Ir US Guide Vasc Access Right  10/27/2013   CLINICAL DATA:  80 year old male with acute anemia secondary to active upper GI bleed. On upper endoscopy he was found have not actively  bleeding duodenum ulcer. A clip was placed but this was insufficient to control the hemorrhage. The patient was taken urgently to interventional Radiology for angiogram and embolization.  EXAM: SELECTIVE VISCERAL ARTERIOGRAPHY; ARTERIOGRAPHY; IR EMBO ART VEN HEMORR LYMPH EXTRAV INC GUIDE ROADMAPPING; IR ULTRASOUND GUIDANCE VASC ACCESS RIGHT  Date: 10/27/2013  PROCEDURE: 1. Ultrasound-guided puncture of the right common femoral artery 2. Catheterization of celiac artery with arteriogram 3. Catheterization of the common hepatic artery with arteriogram 4. Catheterization of the gastroduodenal artery with arteriogram 5. Catheterization of a super duodenum branch of the gastroduodenal artery with arteriogram 6. Coil embolization of the supra duodenal branch 7. Coil embolization of the gastroduodenal artery 8. Catheterization of the superior mesenteric artery with arteriogram 9. Limited right common femoral arteriogram 10. Application of a Mynx percutaneous arterial closure device Interventional Radiologist:  Criselda Peaches, MD  ANESTHESIA/SEDATION: Moderate (conscious) sedation was used. 4 mg Versed, 50 mcg Fentanyl were administered intravenously. The patient's vital signs were monitored continuously by radiology nursing throughout the procedure.  Sedation Time: 75 minutes  FLUOROSCOPY TIME:  27 min 24 seconds  CONTRAST:  76mL OMNIPAQUE IOHEXOL 300 MG/ML  SOLN  TECHNIQUE: Informed consent was obtained from the patient following explanation of the procedure, risks, benefits and alternatives.  The patient understands, agrees and consents for the procedure. All questions were addressed. A time out was performed.  Maximal barrier sterile technique utilized including caps, mask, sterile gowns, sterile gloves, large sterile drape, hand hygiene, and Betadine skin prep.  The right groin was interrogated with ultrasound. The common femoral artery was found to be widely patent. Local anesthesia was attained by infiltration with  1% lidocaine. A small dermatotomy was made. Under real-time sonographic guidance, the common femoral artery was punctured with a 21 gauge micropuncture needle. Image was obtained and stored for the medical record. Using a transitional 5 French micro sheath, a Bentson wire was advanced of the abdominal aorta. A C2 Cobra catheter was advanced to the aorta. The celiac artery was selected and an arteriogram attempted. However, the C2 catheter achieved insufficient purchase. Therefore, the C2 catheter was exchanged for a Mickelson catheter. The Mickelson catheter was secured is slightly more secure early and celiac artery origin. The celiac arteriogram was performed. There is is relatively conventional hepatic arterial anatomy. The gastroduodenal artery is relatively identified.  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 A Renegade ST micro catheter was then navigated into the gastroduodenal artery. Gastroduodenal arteriogram was performed. No definite active hemorrhage is identified. There is a supraduodenal branch off the proximal GDA. This was catheterized and an arteriogram performed. Again, no active arterial bleeding. However, the vessels are in the immediate vicinity of the endoscopically placed clips. The decision was made to proceed with coil embolization of the supraduodenal arterial branch and gastroduodenal artery to decrease the pressure head and reduce the risk and severity of recurrent bleeding. The supraduodenal branch was coil embolized using first a 2 x 4 x 41 mm interlock diamond coil followed by a 3 x 120 mm interlock coil. The micro catheter was then advanced into the gastroduodenal artery at its junction with the right gastric artery. The vessel was then coil embolized using first a 6 x 100 mm interlock coil followed by a single 5 x 80 mm coil and two 5 x 150 mm coils. The vessel was  embolized nearly to the origin. The micro catheter was pulled back into the common hepatic artery and a post embolization arteriogram was performed confirming no further flow into the GDA or supraduodenal branch vessels.  The micro catheter was removed. The Mickelson catheter was navigated in this SMA. A superior mesenteric arteriogram was performed. No definite vascularity in the region of the clips. No retrograde filling of the coil packs. The catheter was removed.  Through the access sheath, a limited right common femoral arteriogram was performed. The access is within the common femoral artery above the bifurcation below the inferior genu of the inferior epigastric artery. The groin was re-prepped with chlorhexidine. 2 g Ancef was administered intravenously. Arterial hemostasis was achieved with the assistance of a Mynx Ace arterial closure device.  IMPRESSION: 1. Successful coil embolization of the gastroduodenal artery and proximal supraduodenal branch in the region of the endoscopically placed clips. 2. No definite active arterial hemorrhage was identified during the angiogram. 3. The patient received an additional unit of blood in interventional radiology in addition to the 1 unit received in endoscopy for a total of 2 units during his procedural course this afternoon. 4. By the end the procedure, the patient had attained hemodynamic stability.  Signed,  Criselda Peaches, MD  [facility]  [facility]   Electronically Signed   By: Jacqulynn Cadet M.D.   On: 10/27/2013 17:07   Dg Chest Port 1 View  10/27/2013   CLINICAL DATA:  Duodenal ulcer coiling. Postprocedure. Asbestos exposure. A risk for aspiration.  EXAM: PORTABLE CHEST - 1 VIEW  COMPARISON:  10/26/2013  FINDINGS: Artifact degradation over the lung apices. Midline trachea. Moderate cardiomegaly. Left greater than right calcified pleural plaques. No pneumothorax. Mild volume loss at the medial left  lung base. Diffuse peribronchial thickening.  IMPRESSION: Mild opacity at the medial left lung base.  Favor atelectasis.  Cardiomegaly without congestive failure.  Asbestos related pleural disease.   Electronically Signed   By: Abigail Miyamoto M.D.   On: 10/27/2013 19:10   [place] Guide Roadmapping  10/27/2013   CLINICAL DATA:  80 year old male with acute anemia secondary to active upper GI bleed. On upper endoscopy he was found have not actively bleeding duodenum ulcer. A clip was placed but this was insufficient to control the hemorrhage. The patient was taken urgently to interventional Radiology for angiogram and embolization.  EXAM: SELECTIVE VISCERAL ARTERIOGRAPHY; ARTERIOGRAPHY; IR EMBO ART VEN HEMORR LYMPH EXTRAV INC GUIDE ROADMAPPING; IR ULTRASOUND GUIDANCE VASC ACCESS RIGHT  Date: 10/27/2013  PROCEDURE: 1. Ultrasound-guided puncture of the right common femoral artery 2. Catheterization of celiac artery with arteriogram 3. Catheterization of the common hepatic artery with arteriogram 4. Catheterization of the gastroduodenal artery with arteriogram 5. Catheterization of a super duodenum branch of the gastroduodenal artery with arteriogram 6. Coil embolization of the supra duodenal branch 7. Coil embolization of the gastroduodenal artery 8. Catheterization of the superior mesenteric artery with arteriogram 9. Limited right common femoral arteriogram 10. Application of a Mynx percutaneous arterial closure device Interventional Radiologist:  Criselda Peaches, MD  ANESTHESIA/SEDATION: Moderate (conscious) sedation was used. 4 mg Versed, 50 mcg Fentanyl were administered intravenously. The patient's vital signs were monitored continuously by radiology nursing throughout the procedure.  Sedation Time: 75 minutes  FLUOROSCOPY TIME:  27 min 24 seconds  CONTRAST:  38mL OMNIPAQUE IOHEXOL 300 MG/ML  SOLN  TECHNIQUE: Informed consent was obtained from the patient following explanation  of the procedure, risks, benefits and alternatives. The patient understands, agrees and consents for the procedure. All questions were addressed. A time out was performed.  Maximal barrier sterile technique utilized including caps, mask, sterile gowns, sterile gloves, large sterile drape, hand hygiene, and Betadine skin prep.  The right groin was interrogated with ultrasound. The common femoral artery was found to be widely patent. Local anesthesia was attained by infiltration with 1% lidocaine. A small dermatotomy was made. Under real-time sonographic guidance, the common femoral artery was punctured with a 21 gauge micropuncture needle. Image was obtained and stored for the medical record. Using a transitional 5 French micro sheath, a Bentson wire was advanced of the abdominal aorta. A C2 Cobra catheter was advanced to the aorta. The celiac artery was selected and an arteriogram attempted. However, the C2 catheter achieved insufficient purchase. Therefore, the C2 catheter was exchanged for a Mickelson catheter. The Mickelson catheter was secured is slightly more secure early and celiac artery origin. The celiac arteriogram was performed. There is is relatively conventional hepatic arterial anatomy. The gastroduodenal artery is relatively identified.  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 A Renegade ST micro catheter was then navigated into  the gastroduodenal artery. Gastroduodenal arteriogram was performed. No definite active hemorrhage is identified. There is a supraduodenal branch off the proximal GDA. This was catheterized and an arteriogram performed. Again, no active arterial bleeding. However, the vessels are in the immediate vicinity of the endoscopically placed clips. The decision was made to proceed with coil embolization of the supraduodenal arterial branch and gastroduodenal artery to  decrease the pressure head and reduce the risk and severity of recurrent bleeding. The supraduodenal branch was coil embolized using first a 2 x 4 x 41 mm interlock diamond coil followed by a 3 x 120 mm interlock coil. The micro catheter was then advanced into the gastroduodenal artery at its junction with the right gastric artery. The vessel was then coil embolized using first a 6 x 100 mm interlock coil followed by a single 5 x 80 mm coil and two 5 x 150 mm coils. The vessel was embolized nearly to the origin. The micro catheter was pulled back into the common hepatic artery and a post embolization arteriogram was performed confirming no further flow into the GDA or supraduodenal branch vessels.  The micro catheter was removed. The Mickelson catheter was navigated in this SMA. A superior mesenteric arteriogram was performed. No definite vascularity in the region of the clips. No retrograde filling of the coil packs. The catheter was removed.  Through the access sheath, a limited right common femoral arteriogram was performed. The access is within the common femoral artery above the bifurcation below the inferior genu of the inferior epigastric artery. The groin was re-prepped with chlorhexidine. 2 g Ancef was administered intravenously. Arterial hemostasis was achieved with the assistance of a Mynx Ace arterial closure device.  IMPRESSION: 1. Successful coil embolization of the gastroduodenal artery and proximal supraduodenal branch in the region of the endoscopically placed clips. 2. No definite active arterial hemorrhage was identified during the angiogram. 3. The patient received an additional unit of blood in interventional radiology in addition to the 1 unit received in endoscopy for a total of 2 units during his procedural course this afternoon. 4. By the end the procedure, the patient had attained hemodynamic stability.  Signed,  Criselda Peaches, MD  [facility]   [facility] Radiology   Electronically Signed   By: Jacqulynn Cadet M.D.   On: 10/27/2013 17:07   Admission HPI: Omar [name] is a 80 yo male with PMH of Bladder CA, BPH, DVT. He is very hard of hearing and much of the history is obtained from his son. He reports that this morning his son got him up at about 6:30 in the morning. As he stood Mr. Buchanan apparently collapsed back onto the the bed, the son reports that "his eyes rolled to the back of this head" he quickly regained conscienceness and the son decided to take him to the ED. The son reports for the past few days he has had dark tarry stools and also some dark red blood in the urine. Given his history of bladder cancer they initially tried to move up his follow up appointment with his Urlogist Dr. Janice Norrie until he collapsed. On presentation to the ED he was found to be hypotensive with a Hgb of 6.2 g/dL. He was given IVF bolus with response of BP and started with a transfusion of 1 unit of PRBC. IMTS was called for admission.  Of note patient does take Mobic daily, ASA daily, has had no hematemesis, no history of gerd, no recent  weight loss, no change in stool caliber. Patient has never had a colonoscopy.   Hospital Course by problem list:     Symptomatic anemia due to Acute duodenal ulcer with bleeding -Patient presented after syncopal episode at home.  Was found to be hypotensive, with a Hgb of 6.7, and reported history of melena, and NSAID use. He was stabilized in the ED and transferred to step down where he was evaluated by GI.  He continued to receive blood transfusion and on hospital day 1 and EGD was preformed which revealed a bleeding duodenal ulcer.  Dr Benson Norway attempted to stop the bleed but arterial spurting was noted.  He was able to slow the bleed and the procedure was aborted, patient was seen to urgent IR embolization with Dr. Laurence Ferrari which was successful.  He was transferred to the ICU for close monitoring.  After the procedure  he did have one bloody bowel movement but otherwise was able to tolerate PO intake his Hgb was monitored and remained stable between 7.3 and 7.8.  He was continued on protonix BID and carafate QID and will continue this at SNF he should continue this for 4-8 weeks.  He should continue to AVOID all NSAIDs.  Recommend Tylenol for pain.  UTI Patient denies symptoms however U/A showed TNTC WBC and TNTC RBC.  Culture grew 95k colonies of Enterococcus.  Will treat a 7 day course of Macrobid.    Cancer of dome of urinary bladder -Patient did have some hematuria and pyuria on U/A, patient did not report any UTI symptoms but given TNTC WBC will treat.  He was visited by Dr. Tresa Moore (urology) inpatinet and will follow up with Dr. Alen Blew (urology).   Decondinting -Patient evaluated by PT, had difficulty with transferring and was felt he would benefit from SNF acute rehab before returning home with sons to resume care.  Code Status: DNR Discharge Vitals:   BP 125/67  Pulse 69  Temp(Src) 98 F (36.7 C) (Oral)  Resp 18  Ht 5\' 10"  (1.778 m)  Wt 167 lb 8.8 oz (76 kg)  BMI 24.04 kg/m2  SpO2 94%  Discharge Labs:  Results for orders placed during the hospital encounter of 10/26/13 (from the past 24 hour(s))  CBC     Status: Abnormal   Collection Time    10/30/13  8:51 PM      Result Value Ref Range   WBC 7.6  4.0 - 10.5 K/uL   RBC 2.48 (*) 4.22 - 5.81 MIL/uL   Hemoglobin 7.3 (*) 13.0 - 17.0 g/dL   HCT 21.5 (*) 39.0 - 52.0 %   MCV 86.7  78.0 - 100.0 fL   MCH 29.4  26.0 - 34.0 pg   MCHC 34.0  30.0 - 36.0 g/dL   RDW 15.6 (*) 11.5 - 15.5 %   Platelets 147 (*) 150 - 400 K/uL  GLUCOSE, CAPILLARY     Status: Abnormal   Collection Time    10/31/13  6:16 AM      Result Value Ref Range   Glucose-Capillary 108 (*) 70 - 99 mg/dL  CBC     Status: Abnormal   Collection Time    10/31/13  9:25 AM      Result Value Ref Range   WBC 8.0  4.0 - 10.5 K/uL   RBC 2.54 (*) 4.22 - 5.81 MIL/uL   Hemoglobin 7.6 (*)  13.0 - 17.0 g/dL   HCT 22.2 (*) 39.0 - 52.0 %   MCV 87.4  78.0 - 100.0 fL   MCH 29.9  26.0 - 34.0 pg   MCHC 34.2  30.0 - 36.0 g/dL   RDW 16.2 (*) 11.5 - 15.5 %   Platelets 146 (*) 150 - 400 K/uL    Signed: Joni Reining, DO 10/31/2013, 11:38 AM   Time Spent on Discharge: 35 minutes Services Ordered on Discharge: PT Equipment Ordered on Discharge: none

## 2013-10-31 NOTE — Progress Notes (Signed)
Report called to [facility] at [facility] in [place]. Questions answered. Susie Cassette

## 2013-10-31 NOTE — Progress Notes (Signed)
CSW spoke to patient's son [name], by bedside to inform that their first choice, [facility] made a bed offer and explained that MD thinks dc may be today. Patient's son would like to work with PT first today to see if they can handle patient and take him home, if not patient does have a confirmed bed at [facility].  Jeanette Caprice, MSW, [place]

## 2013-10-31 NOTE — Clinical Social Work Note (Signed)
Clinical Social Worker facilitated patient discharge including contacting patient family and facility to confirm patient discharge plans.  Clinical information faxed to facility and family agreeable with plan.  CSW arranged ambulance transport via [facility] to [facility].  RN to call report prior to discharge.  Clinical Social Worker will sign off for now as social work intervention is no longer needed. Please consult us again if new need arises.  Jesse Yuliet Needs, LCSW [phone] 

## 2014-01-22 ENCOUNTER — Emergency Department (HOSPITAL_COMMUNITY)
Admission: EM | Admit: 2014-01-22 | Discharge: 2014-01-22 | Disposition: A | Discharge status: Home / Self Care | Payer: Medicare Other | Attending: Emergency Medicine | Admitting: Emergency Medicine

## 2014-01-22 ENCOUNTER — Encounter (HOSPITAL_COMMUNITY): Payer: Self-pay | Admitting: Emergency Medicine

## 2014-01-22 DIAGNOSIS — Z8551 Personal history of malignant neoplasm of bladder: Secondary | ICD-10-CM | POA: Insufficient documentation

## 2014-01-22 DIAGNOSIS — N3000 Acute cystitis without hematuria: Secondary | ICD-10-CM | POA: Insufficient documentation

## 2014-01-22 DIAGNOSIS — Z86718 Personal history of other venous thrombosis and embolism: Secondary | ICD-10-CM | POA: Insufficient documentation

## 2014-01-22 DIAGNOSIS — Z7901 Long term (current) use of anticoagulants: Secondary | ICD-10-CM | POA: Insufficient documentation

## 2014-01-22 DIAGNOSIS — Z792 Long term (current) use of antibiotics: Secondary | ICD-10-CM | POA: Insufficient documentation

## 2014-01-22 DIAGNOSIS — N3001 Acute cystitis with hematuria: Secondary | ICD-10-CM

## 2014-01-22 DIAGNOSIS — Z85828 Personal history of other malignant neoplasm of skin: Secondary | ICD-10-CM | POA: Insufficient documentation

## 2014-01-22 DIAGNOSIS — Z86711 Personal history of pulmonary embolism: Secondary | ICD-10-CM | POA: Insufficient documentation

## 2014-01-22 DIAGNOSIS — Z79899 Other long term (current) drug therapy: Secondary | ICD-10-CM | POA: Insufficient documentation

## 2014-01-22 DIAGNOSIS — R011 Cardiac murmur, unspecified: Secondary | ICD-10-CM | POA: Insufficient documentation

## 2014-01-22 DIAGNOSIS — Z8781 Personal history of (healed) traumatic fracture: Secondary | ICD-10-CM | POA: Insufficient documentation

## 2014-01-22 DIAGNOSIS — Z91040 Latex allergy status: Secondary | ICD-10-CM | POA: Insufficient documentation

## 2014-01-22 DIAGNOSIS — N400 Enlarged prostate without lower urinary tract symptoms: Secondary | ICD-10-CM | POA: Insufficient documentation

## 2014-01-22 DIAGNOSIS — Z8709 Personal history of other diseases of the respiratory system: Secondary | ICD-10-CM | POA: Insufficient documentation

## 2014-01-22 LAB — URINALYSIS, ROUTINE W REFLEX MICROSCOPIC
Bilirubin Urine: NEGATIVE
Glucose, UA: NEGATIVE mg/dL
Ketones, ur: NEGATIVE mg/dL
Nitrite: NEGATIVE
Protein, ur: >300 mg/dL — AB
Specific Gravity, Urine: 1.015 (ref 1.005–1.030)
Urobilinogen, UA: 0.2 mg/dL (ref 0.0–1.0)
pH: 7.5 (ref 5.0–8.0)

## 2014-01-22 LAB — CBC WITH DIFFERENTIAL/PLATELET
Basophils Absolute: 0 10*3/uL (ref 0.0–0.1)
Basophils Relative: 0 % (ref 0–1)
Eosinophils Absolute: 0.2 10*3/uL (ref 0.0–0.7)
Eosinophils Relative: 3 % (ref 0–5)
HCT: 28.8 % — ABNORMAL LOW (ref 39.0–52.0)
Hemoglobin: 8.9 g/dL — ABNORMAL LOW (ref 13.0–17.0)
Lymphocytes Relative: 12 % (ref 12–46)
Lymphs Abs: 0.9 10*3/uL (ref 0.7–4.0)
MCH: 23.9 pg — ABNORMAL LOW (ref 26.0–34.0)
MCHC: 30.9 g/dL (ref 30.0–36.0)
MCV: 77.4 fL — ABNORMAL LOW (ref 78.0–100.0)
Monocytes Absolute: 0.4 10*3/uL (ref 0.1–1.0)
Monocytes Relative: 6 % (ref 3–12)
Neutro Abs: 6.1 10*3/uL (ref 1.7–7.7)
Neutrophils Relative %: 79 % — ABNORMAL HIGH (ref 43–77)
Platelets: 264 10*3/uL (ref 150–400)
RBC: 3.72 MIL/uL — ABNORMAL LOW (ref 4.22–5.81)
RDW: 19 % — ABNORMAL HIGH (ref 11.5–15.5)
WBC: 7.6 10*3/uL (ref 4.0–10.5)

## 2014-01-22 LAB — BASIC METABOLIC PANEL
Anion gap: 12 (ref 5–15)
BUN: 17 mg/dL (ref 6–23)
CO2: 25 mEq/L (ref 19–32)
Calcium: 8.8 mg/dL (ref 8.4–10.5)
Chloride: 102 mEq/L (ref 96–112)
Creatinine, Ser: 0.73 mg/dL (ref 0.50–1.35)
GFR calc Af Amer: 89 mL/min — ABNORMAL LOW (ref >90)
GFR calc non Af Amer: 76 mL/min — ABNORMAL LOW (ref >90)
Glucose, Bld: 88 mg/dL (ref 70–99)
Potassium: 4.5 mEq/L (ref 3.7–5.3)
Sodium: 139 mEq/L (ref 137–147)

## 2014-01-22 LAB — URINE MICROSCOPIC-ADD ON

## 2014-01-22 LAB — PROTIME-INR
INR: 2.18 — ABNORMAL HIGH (ref 0.00–1.49)
Prothrombin Time: 24.3 seconds — ABNORMAL HIGH (ref 11.6–15.2)

## 2014-01-22 NOTE — ED Notes (Signed)
Bed: WA20 Expected date:  Expected time:  Means of arrival:  Comments: EMS-fall 

## 2014-01-22 NOTE — Discharge Instructions (Signed)
Return here as needed.  Followup with the urologist tomorrow. call them for an appointment, and advised them that you were seen here

## 2014-01-22 NOTE — ED Notes (Signed)
Per EMS, pt had a large amount of blood in his urine this morning. Pt has had blood in his urine for 1 week. Pt had urinalysis performed yesterday which showed blood in urine and a UTI. Ampicillin was started yesterday evening. Hx of bladder cancer. Pt is on blood thinners due to hx of DVTs.

## 2014-01-22 NOTE — Progress Notes (Signed)
  CARE MANAGEMENT ED NOTE 01/22/2014  Patient:  Omar [name], Omar [name]   Account Number:  [id number]  Date Initiated:  01/22/2014  Documentation initiated by:  Livia Snellen  Subjective/Objective Assessment:   Patient presents to Ed with hematuria     Subjective/Objective Assessment Detail:   Patient with pmhx of bladder cancer     Action/Plan:   Action/Plan Detail:   Anticipated DC Date:       Status Recommendation to Physician:   Result of Recommendation:    Other ED Services  Consult Working [place]  Other    Choice offered to / List presented to:            Status of service:  Completed, signed off  ED Comments:   ED Comments Detail:  EDCM spoke to patient and his son at bedside.  Patient lives at home with his two sons.  Patient's son confirms that patient receives home health services with Arville Go, RN, PT, OT.  Patient has a wheelchair, walker, shower chair, and bedside commode at home.  Patient has twenty four hour care at home between his two sons.  EDCM provide patient's osn with list of home health agencies in [place] highlighting [place].  EDCM also provided patient's son with private duty nursing list as patient's son is currently looking for extra assistance in the home. Patient's son thankful for resources.  No further EDCM needs at this time.

## 2014-01-22 NOTE — ED Provider Notes (Signed)
CSN: [id number]     Arrival date & time 01/22/14  1117 History   First MD Initiated Contact with Patient 01/22/14 1135     Chief Complaint  Patient presents with  . Hematuria     (Consider location/radiation/quality/duration/timing/severity/associated sxs/prior Treatment) HPI Patient presents to the emergency department with hematuria, that was worsened.  This morning.  The patient has had hematuria in the past.  His son states, that he took a urinalysis, to Dr. Janice Norrie yesterday.  Patient is pleasant antibiotics at that time.  Son states, that he had a large amount of blood in the urine this morning.  He said this blood has been and his urine over the last week, especially.  Patient has not had any nausea, vomiting, fever, weakness, dizziness, and blurred vision, chest pain, shortness of breath, back pain, diarrhea, rash, or syncope.  The patient has not had any increased urination.  Patient has had some episodes of not being able to hold his urine.  Patient has had bladder cancer in the past Past Medical History  Diagnosis Date  . History of pulmonary embolus (PE)     APRIL 2011--  MULTIPLE PE'S AND DVT  . BPH (benign prostatic hypertrophy)   . History of urinary retention   . History of transurethral destruction of bladder lesion   . H/O asbestos exposure   . History of DVT (deep vein thrombosis)     APRIL 2011--  UPPER AND LOWER EXTREMITIES  . Bladder tumor     recurrent  . Nocturia   . Heart murmur, systolic     enlarged  . Bladder cancer 10/20/12    high grade papillary/invasive urothelial  . Hx of radiation therapy 01/02/13- 02/19/13    subtotal bladder 4500 cGy 25 sessions, boost 1600 cGy 8 sessions  . Skin cancer 03/2013    left cheek  . Asbestosis   . Fracture   . Hx of radiation therapy 01/02/13- 02/19/13    subtotal bladder 4500 cGy 25 sessions, reduced field boost 1600 cGy 8 sessions   Past Surgical History  Procedure Laterality Date  . Repair recurrent left inguinal  hernia  06-01-1999    x 2  . Transurethral resection of prostate  1999   &  11-17-2000  . Cysto/ eua/ placement suprapubic tube  01-13-2000  . Transurethral resection of bladder tumor  06-30-2010  . Transurethral resection of prostate  07-31-2010    AND BLADDER RESECTION BLADDER TUMOR  . Vena cava filter placement  APRIL 2011    INFERIOR  . Transthoracic echocardiogram  10-11-2012   DR TILLEY    CONCENTRIC LVH WITH NORMAL LVSF/ EF 52%/ GRADE I DIASTOLIC DYSFUNCTION  . Transurethral resection of bladder tumor N/A 10/20/2012    Procedure: TRANSURETHRAL RESECTION OF BLADDER TUMOR (TURBT);  Surgeon: Hanley Ben, MD;  [facility];  Service: Urology;  Laterality: N/A;  . Cystoscopy N/A 10/20/2012    Procedure: CYSTOSCOPY;  Surgeon: Hanley Ben, MD;  Location: [facility];  Service: Urology;  Laterality: N/A;  . Cataract surgery      BILAT. EYES  . Mohs surgery  03/2013    left cheek  . Hip arthroplasty Left 05/01/2013    Procedure: ARTHROPLASTY BIPOLAR HIP;  Surgeon: Renette Butters, MD;  Location: [facility];  Service: Orthopedics;  Laterality: Left;  . Esophagogastroduodenoscopy N/A 10/27/2013    Procedure: ESOPHAGOGASTRODUODENOSCOPY (EGD);  Surgeon: Beryle Beams, MD;  Location: [facility] ENDOSCOPY;  Service: Endoscopy;  Laterality: N/A;  No family history on file. History  Substance Use Topics  . Smoking status: Never Smoker   . Smokeless tobacco: Never Used  . Alcohol Use: No    Review of Systems   All other systems negative except as documented in the HPI. All pertinent positives and negatives as reviewed in the HPI. Allergies  Benadryl and Latex  Home Medications   Prior to Admission medications   Medication Sig Start Date End Date Taking? Authorizing Provider  acetaminophen (TYLENOL) 500 MG tablet Take 1-2 tablets (500-1,000 mg total) by mouth every 6 (six) hours as needed for moderate pain. 10/31/13  Yes Joni Reining, DO  acidophilus  (RISAQUAD) CAPS capsule Take 1 capsule by mouth every Monday, Wednesday, and Friday.   Yes Historical Provider, MD  ampicillin (PRINCIPEN) 500 MG capsule Take 500 mg by mouth 3 (three) times daily. 01/21/14 01/28/14 Yes Historical Provider, MD  Cholecalciferol (VITAMIN D) 2000 UNITS CAPS Take 2,000 Units by mouth daily.   Yes Historical Provider, MD  Cranberry 450 MG CAPS Take 900 mg by mouth daily.   Yes Historical Provider, MD  feeding supplement, ENSURE COMPLETE, (ENSURE COMPLETE) LIQD Take 237 mLs by mouth daily with lunch. 10/31/13  Yes Joni Reining, DO  ferrous sulfate 325 (65 FE) MG EC tablet Take 325 mg by mouth daily with breakfast.   Yes Historical Provider, MD  finasteride (PROSCAR) 5 MG tablet Take 5 mg by mouth daily.   Yes Historical Provider, MD  furosemide (LASIX) 40 MG tablet Take 20 mg by mouth every Monday, Wednesday, and Friday.    Yes Historical Provider, MD  magnesium hydroxide (MILK OF MAGNESIA) 400 MG/5ML suspension Take 30 mLs by mouth daily as needed for mild constipation.    Yes Historical Provider, MD  Omega-3 Fatty Acids (FISH OIL) 1000 MG CAPS Take 1,000 mg by mouth daily.    Yes Historical Provider, MD  potassium chloride SA (K-DUR,KLOR-CON) 20 MEQ tablet Take 20 mEq by mouth every Monday, Wednesday, and Friday.   Yes Historical Provider, MD  warfarin (COUMADIN) 5 MG tablet Take 5-7.5 mg by mouth daily. 5 mg tablet all days but on Wednesday 7.5 mg   Yes Historical Provider, MD   BP 111/41  Pulse 57  Temp(Src) 97.7 F (36.5 C) (Oral)  Resp 18  SpO2 100% Physical Exam  Nursing note and vitals reviewed. Constitutional: He is oriented to person, place, and time. He appears well-developed and well-nourished. No distress.  HENT:  Head: Normocephalic and atraumatic.  Mouth/Throat: Oropharynx is clear and moist.  Eyes: Pupils are equal, round, and reactive to light.  Neck: Normal range of motion. Neck supple.  Cardiovascular: Normal rate, regular rhythm and normal heart  sounds.  Exam reveals no gallop and no friction rub.   No murmur heard. Pulmonary/Chest: Effort normal and breath sounds normal.  Abdominal: Soft. Bowel sounds are normal. He exhibits no distension. There is no tenderness.  Neurological: He is alert and oriented to person, place, and time. He exhibits normal muscle tone. Coordination normal.  Skin: Skin is warm and dry.    ED Course  Procedures (including critical care time) Labs Review Labs Reviewed  CBC WITH DIFFERENTIAL - Abnormal; Notable for the following:    RBC 3.72 (*)    Hemoglobin 8.9 (*)    HCT 28.8 (*)    MCV 77.4 (*)    MCH 23.9 (*)    RDW 19.0 (*)    Neutrophils Relative % 79 (*)    All other components  within normal limits  BASIC METABOLIC PANEL - Abnormal; Notable for the following:    GFR calc non Af Amer 76 (*)    GFR calc Af Amer 89 (*)    All other components within normal limits  URINALYSIS, ROUTINE W REFLEX MICROSCOPIC - Abnormal; Notable for the following:    Color, Urine RED (*)    APPearance TURBID (*)    Hgb urine dipstick LARGE (*)    Protein, ur >300 (*)    Leukocytes, UA LARGE (*)    All other components within normal limits  URINE CULTURE  URINE MICROSCOPIC-ADD ON    Patient will be irrigated by [facility].  Advised of the need to follow up with Dr. Janice Norrie.  Patient, is stable here in the emergency department.  Is advised to continue his antibiotics and will wait for culture results    Brent General, PA-C 01/22/14 1649

## 2014-01-22 NOTE — ED Notes (Signed)
[facility] called again, states we are at the top of the list for transport.

## 2014-01-23 ENCOUNTER — Ambulatory Visit: Payer: Medicare Other | Admitting: Oncology

## 2014-01-23 ENCOUNTER — Other Ambulatory Visit: Payer: Medicare Other

## 2014-01-23 LAB — URINE CULTURE
Colony Count: NO GROWTH
Culture: NO GROWTH

## 2014-01-23 NOTE — ED Provider Notes (Signed)
Medical screening examination/treatment/procedure(s) were conducted as a shared visit with non-physician practitioner(s) and myself.  I personally evaluated the patient during the encounter.   EKG Interpretation None     Patient with hematuria. On anticoagulation. Foley catheter will be placed and bladder irrigated. Will need urology followup  Jasper Riling. Alvino Chapel, MD 01/23/14 [phone]

## 2014-04-26 ENCOUNTER — Other Ambulatory Visit: Payer: Self-pay

## 2014-06-18 ENCOUNTER — Inpatient Hospital Stay (HOSPITAL_COMMUNITY)
Admission: EM | Admit: 2014-06-18 | Discharge: 2014-06-20 | DRG: 871 | Disposition: A | Discharge status: Hospice | Payer: Medicare Other | Attending: Internal Medicine | Admitting: Internal Medicine

## 2014-06-18 ENCOUNTER — Encounter (HOSPITAL_COMMUNITY): Payer: Self-pay | Admitting: Emergency Medicine

## 2014-06-18 ENCOUNTER — Emergency Department (HOSPITAL_COMMUNITY): Payer: Medicare Other

## 2014-06-18 DIAGNOSIS — R0600 Dyspnea, unspecified: Secondary | ICD-10-CM | POA: Diagnosis present

## 2014-06-18 DIAGNOSIS — A419 Sepsis, unspecified organism: Secondary | ICD-10-CM

## 2014-06-18 DIAGNOSIS — Z86711 Personal history of pulmonary embolism: Secondary | ICD-10-CM

## 2014-06-18 DIAGNOSIS — C671 Malignant neoplasm of dome of bladder: Secondary | ICD-10-CM | POA: Diagnosis present

## 2014-06-18 DIAGNOSIS — J9601 Acute respiratory failure with hypoxia: Secondary | ICD-10-CM | POA: Diagnosis present

## 2014-06-18 DIAGNOSIS — J690 Pneumonitis due to inhalation of food and vomit: Secondary | ICD-10-CM

## 2014-06-18 DIAGNOSIS — D638 Anemia in other chronic diseases classified elsewhere: Secondary | ICD-10-CM | POA: Diagnosis present

## 2014-06-18 DIAGNOSIS — Z77090 Contact with and (suspected) exposure to asbestos: Secondary | ICD-10-CM | POA: Diagnosis present

## 2014-06-18 DIAGNOSIS — N179 Acute kidney failure, unspecified: Secondary | ICD-10-CM | POA: Diagnosis present

## 2014-06-18 DIAGNOSIS — Z7189 Other specified counseling: Secondary | ICD-10-CM

## 2014-06-18 DIAGNOSIS — C7801 Secondary malignant neoplasm of right lung: Secondary | ICD-10-CM | POA: Diagnosis present

## 2014-06-18 DIAGNOSIS — Z8551 Personal history of malignant neoplasm of bladder: Secondary | ICD-10-CM

## 2014-06-18 DIAGNOSIS — N289 Disorder of kidney and ureter, unspecified: Secondary | ICD-10-CM

## 2014-06-18 DIAGNOSIS — C7802 Secondary malignant neoplasm of left lung: Secondary | ICD-10-CM | POA: Diagnosis present

## 2014-06-18 DIAGNOSIS — Z515 Encounter for palliative care: Secondary | ICD-10-CM

## 2014-06-18 DIAGNOSIS — Z86718 Personal history of other venous thrombosis and embolism: Secondary | ICD-10-CM | POA: Diagnosis not present

## 2014-06-18 DIAGNOSIS — D630 Anemia in neoplastic disease: Secondary | ICD-10-CM | POA: Diagnosis present

## 2014-06-18 DIAGNOSIS — R6520 Severe sepsis without septic shock: Principal | ICD-10-CM | POA: Diagnosis present

## 2014-06-18 DIAGNOSIS — Z923 Personal history of irradiation: Secondary | ICD-10-CM | POA: Diagnosis not present

## 2014-06-18 DIAGNOSIS — Z66 Do not resuscitate: Secondary | ICD-10-CM | POA: Diagnosis present

## 2014-06-18 DIAGNOSIS — C7800 Secondary malignant neoplasm of unspecified lung: Secondary | ICD-10-CM | POA: Diagnosis present

## 2014-06-18 DIAGNOSIS — E860 Dehydration: Secondary | ICD-10-CM | POA: Diagnosis present

## 2014-06-18 DIAGNOSIS — D72829 Elevated white blood cell count, unspecified: Secondary | ICD-10-CM | POA: Diagnosis present

## 2014-06-18 DIAGNOSIS — T17908A Unspecified foreign body in respiratory tract, part unspecified causing other injury, initial encounter: Secondary | ICD-10-CM | POA: Insufficient documentation

## 2014-06-18 LAB — BASIC METABOLIC PANEL
Anion gap: 13 (ref 5–15)
BUN: 52 mg/dL — ABNORMAL HIGH (ref 6–23)
CALCIUM: 12.8 mg/dL — AB (ref 8.4–10.5)
CO2: 23 mEq/L (ref 19–32)
Chloride: 110 mEq/L (ref 96–112)
Creatinine, Ser: 1.59 mg/dL — ABNORMAL HIGH (ref 0.50–1.35)
GFR, EST AFRICAN AMERICAN: 41 mL/min — AB (ref >90)
GFR, EST NON AFRICAN AMERICAN: 35 mL/min — AB (ref >90)
Glucose, Bld: 178 mg/dL — ABNORMAL HIGH (ref 70–99)
Potassium: 4.4 mEq/L (ref 3.7–5.3)
Sodium: 146 mEq/L (ref 137–147)

## 2014-06-18 LAB — CBC WITH DIFFERENTIAL/PLATELET
BASOS ABS: 0 10*3/uL (ref 0.0–0.1)
Basophils Relative: 0 % (ref 0–1)
Eosinophils Absolute: 0 10*3/uL (ref 0.0–0.7)
Eosinophils Relative: 0 % (ref 0–5)
HCT: 28.8 % — ABNORMAL LOW (ref 39.0–52.0)
Hemoglobin: 8.6 g/dL — ABNORMAL LOW (ref 13.0–17.0)
Lymphocytes Relative: 2 % — ABNORMAL LOW (ref 12–46)
Lymphs Abs: 0.6 10*3/uL — ABNORMAL LOW (ref 0.7–4.0)
MCH: 24.2 pg — ABNORMAL LOW (ref 26.0–34.0)
MCHC: 29.9 g/dL — ABNORMAL LOW (ref 30.0–36.0)
MCV: 80.9 fL (ref 78.0–100.0)
MONOS PCT: 3 % (ref 3–12)
Monocytes Absolute: 0.9 10*3/uL (ref 0.1–1.0)
NEUTROS PCT: 95 % — AB (ref 43–77)
Neutro Abs: 27.1 10*3/uL — ABNORMAL HIGH (ref 1.7–7.7)
PLATELETS: 325 10*3/uL (ref 150–400)
RBC: 3.56 MIL/uL — ABNORMAL LOW (ref 4.22–5.81)
RDW: 20.5 % — AB (ref 11.5–15.5)
WBC: 28.6 10*3/uL — AB (ref 4.0–10.5)

## 2014-06-18 LAB — MRSA PCR SCREENING: MRSA BY PCR: NEGATIVE

## 2014-06-18 MED ORDER — SODIUM CHLORIDE 0.9 % IV SOLN: 250.0000 mL | INTRAVENOUS | Status: DC | PRN

## 2014-06-18 MED ORDER — MORPHINE SULFATE 2 MG/ML IJ SOLN
2.0000 mg | INTRAMUSCULAR | Status: DC
  Administered 2014-06-18 – 2014-06-19 (×7): 2 mg via INTRAVENOUS
  Filled 2014-06-18 (×7): qty 1

## 2014-06-18 MED ORDER — PIPERACILLIN-TAZOBACTAM 3.375 G IVPB
3.3750 g | Freq: Once | INTRAVENOUS | Status: AC
  Administered 2014-06-18: 3.375 g via INTRAVENOUS
  Filled 2014-06-18: qty 50

## 2014-06-18 MED ORDER — SODIUM CHLORIDE 0.9 % IV SOLN: INTRAVENOUS | Status: DC

## 2014-06-18 MED ORDER — CHLORHEXIDINE GLUCONATE 0.12 % MT SOLN
15.0000 mL | Freq: Two times a day (BID) | OROMUCOSAL | Status: DC
  Administered 2014-06-19 – 2014-06-20 (×3): 15 mL via OROMUCOSAL
  Filled 2014-06-18 (×3): qty 15

## 2014-06-18 MED ORDER — SODIUM CHLORIDE 0.9 % IJ SOLN: 3.0000 mL | INTRAMUSCULAR | Status: DC | PRN

## 2014-06-18 MED ORDER — SODIUM CHLORIDE 0.9 % IJ SOLN
3.0000 mL | Freq: Two times a day (BID) | INTRAMUSCULAR | Status: DC
  Administered 2014-06-18 – 2014-06-19 (×3): 3 mL via INTRAVENOUS

## 2014-06-18 MED ORDER — CETYLPYRIDINIUM CHLORIDE 0.05 % MT LIQD
7.0000 mL | Freq: Two times a day (BID) | OROMUCOSAL | Status: DC
  Administered 2014-06-18 – 2014-06-20 (×4): 7 mL via OROMUCOSAL

## 2014-06-18 MED ORDER — PIPERACILLIN-TAZOBACTAM 3.375 G IVPB
3.3750 g | Freq: Three times a day (TID) | INTRAVENOUS | Status: DC
  Administered 2014-06-18 – 2014-06-20 (×5): 3.375 g via INTRAVENOUS
  Filled 2014-06-18 (×7): qty 50

## 2014-06-18 MED ORDER — SODIUM CHLORIDE 0.9 % IV SOLN
INTRAVENOUS | Status: DC
  Administered 2014-06-18 – 2014-06-20 (×2): via INTRAVENOUS

## 2014-06-18 MED ORDER — LORAZEPAM 2 MG/ML IJ SOLN
2.0000 mg | INTRAMUSCULAR | Status: DC | PRN
  Administered 2014-06-18 – 2014-06-20 (×3): 2 mg via INTRAVENOUS
  Filled 2014-06-18 (×3): qty 1

## 2014-06-18 NOTE — Progress Notes (Signed)
Clinical Social Work Department BRIEF PSYCHOSOCIAL ASSESSMENT 06/18/2014  Patient:  Omar [name], Omar [name]     Account Number:  [id number]     [place] date:  06/18/2014  Clinical Social Worker:  Janira Mandell Inez Catalina  Date/Time:  06/18/2014 12:00 M  Referred by:  CSW  Date Referred:  06/18/2014 Referred for  SNF Placement  Residential hospice placement   Other Referral:   Interview type:  Family Other interview type:   pt son at bedside, pt on bipap and only alert to self at this time    PSYCHOSOCIAL DATA Living Status:  FACILITY Admitted from facility:  [facility] [place] Level of care:  Assisted Living Primary support name:  Kimmy Parish Primary support relationship to patient:  CHILD, ADULT Degree of support available:   strong, care giver and durable poa    Pt other son Francee Piccolo also assist with care, however [name] is Tax adviser for pt.    Pt also has 2 other children out of state.    CURRENT CONCERNS Current Concerns  Post-Acute Placement   Other Concerns:    SOCIAL WORK ASSESSMENT / PLAN CSW met with pt and pt son at bedside to complete psychosocial assessment. Pt on bipap and alert and oreitned to self however resting and not engaging in assessment. Pt son, [name], shared that patient moved to [place] [facility] assisted living in [place] the weeek of Thanksgiving. At that time patient was able to do his ADl's however need some assistance with transfers. Patient son shared that patient dramatically declined over the past few weeks. Pt son stated patient is now unable to feed self, assisted iwht adls etc.    Pt son was visiting this morning and helping pt to eat oatmeal and patient aspirated. Per son they do not wish to have patient intubated and is a DNR.    Pt son shared plan of care including iv antibiotic but ultimately needing hospice services upon discharge. CSW and pt son discussed options including returning to [facility] skilled nursing  with hospice following and residential hospice if approrpriate. Pt son shared that they would prefer patient to be followed by [facility] as they are neighbors and close with a higher up at [place].    CSW contacted [facility] where patient is in assisted living. [facility]. Per [facility] they are not contracated with [employer]. CSW left message for [facility] [place] regarding [facility]. Pt son stated tha the would prefer whichever facility could have pt followed by [facility].   Assessment/plan status:  Psychosocial Support/Ongoing Assessment of Needs Other assessment/ plan:   Information/referral to community resources:   hospice, skilled nursing not needed at htis time as family has chosen [facility] if needed and wanting to return to [facility] or [facility]    PATIENT'S/FAMILY'S RESPONSE TO PLAN OF CARE: Pt son thanked csw for concern and support. Pt son hopes for patient to be able to return to [facility] and be admitted to skilled nursing with hospice following or to a residential hosice facility when medically stable.Pt son shares that patient family is very supportive. Pt son [name] is durable POA.       Noreene Larsson [phone]  ED CSW 06/18/2014 1419pm

## 2014-06-18 NOTE — ED Notes (Signed)
Bed: [facility]37 Expected date:  Expected time:  Means of arrival:  Comments: [facility]

## 2014-06-18 NOTE — Progress Notes (Signed)
CARE MANAGEMENT NOTE 06/18/2014  Patient:  RAYE, SLYTER R   Account Number:  [id number]  Date Initiated:  06/18/2014  Documentation initiated by:  DAVIS,RHONDA  Subjective/Objective Assessment:   patient was fed oatmeal around 8 AM when he suddenly began choking and developed dyspnea. Patient's pulse ox dropped to 40%. Large amount of oatmeal was suctioned from the oropharynx and EMS was called.     Action/Plan:   residental hospice versus snf placement   Anticipated DC Date:  06/21/2014   Anticipated DC Plan:  [place]  In-house referral  Clinical Social Worker      DC Planning Services  CM consult      [facility] Choice  NA   Choice offered to / List presented to:  NA      DME agency  NA     [place] arranged  NA      [place] agency  NA   Status of service:  In process, will continue to follow Medicare Important Message given?   (If response is "NO", the following Medicare IM given date fields will be blank) Date Medicare IM given:   Medicare IM given by:   Date Additional Medicare IM given:   Additional Medicare IM given by:    Discharge Disposition:    Per UR Regulation:  Reviewed for med. necessity/level of care/duration of stay  If discussed at [place] of Stay Meetings, dates discussed:    Comments:  [id number]/Rhonda Davis,RN,BSN,CCM: Patient is currently on bipap

## 2014-06-18 NOTE — Progress Notes (Addendum)
Clinical Social Work Department CLINICAL SOCIAL WORK PLACEMENT NOTE 06/18/2014  Patient:  Omar [name], Omar [name]  Account Number:  [id number] Admit date:  06/18/2014  Clinical Social Worker:  Loletta Specter  Date/time:  06/18/2014 02:19 PM  Clinical Social Work is seeking post-discharge placement for this patient at the following level of care:   [place]   (*CSW will update this form in Epic as items are completed)   06/18/2014  Patient/family provided with [facility] Department of Clinical Social Work's list of facilities offering this level of care within the geographic area requested by the patient (or if unable, by the patient's family).  06/18/2014  Patient/family informed of their freedom to choose among providers that offer the needed level of care, that participate in Medicare, Medicaid or managed care program needed by the patient, have an available bed and are willing to accept the patient.  06/18/2014  Patient/family informed of [facility]' ownership interest in [facility], as well as of the fact that they are under no obligation to receive care at this facility.  PASARR submitted to EDS on 06/18/2014 PASARR number received on   FL2 transmitted to all facilities in geographic area requested by pt/family on  06/18/2014 FL2 transmitted to all facilities within larger geographic area on   Patient informed that his/her managed care company has contracts with or will negotiate with  certain facilities, including the following:     Patient/family informed of bed offers received:   Patient chooses bed at [facility] Physician recommends and patient chooses bed at    Patient to be transferred to  on  [facility] on 06/18/2014 Patient to be transferred to facility by ambulance Corey Harold) Patient and family notified of transfer on 06/29/2014 Name of family member notified:  Pt son, [name] notified at bedside.   The following  physician request were entered in Epic:   Additional Comments:  Noreene Larsson [phone]  ED CSW 06/18/2014 2:22 PM    Alison Murray, MSW, [place] [facility] [phone]

## 2014-06-18 NOTE — ED Notes (Signed)
Suctioning pt's mouth, oatmeal like substance noted in tubing and suction canister.

## 2014-06-18 NOTE — Progress Notes (Signed)
ANTIBIOTIC CONSULT NOTE - INITIAL  Pharmacy Consult for Zosyn Indication: aspiration pneumonia  Allergies  Allergen Reactions  . Benadryl [Diphenhydramine Hcl] Other (See Comments)    Urinary retention  . Latex Other (See Comments)    blisters    Patient Measurements: Height: 5\' 8"  (172.7 cm) Weight: 176 lb (79.833 kg) IBW/kg (Calculated) : 68.4  Vital Signs: Temp: 99 F (37.2 C) (12/08 0934) Temp Source: Rectal (12/08 0934) BP: 110/60 mmHg (12/08 1230) Pulse Rate: 116 (12/08 1230) Intake/Output from previous day:   Intake/Output from this shift:    Labs:  Recent Labs  06/18/14 1041  WBC 28.6*  HGB 8.6*  PLT 325  CREATININE 1.59*   Estimated Creatinine Clearance: 26.3 mL/min (by C-G formula based on Cr of 1.59).   Microbiology: No results found for this or any previous visit (from the past 720 hour(s)).  Medical History: Past Medical History  Diagnosis Date  . History of pulmonary embolus (PE)     APRIL 2011--  MULTIPLE PE'S AND DVT  . BPH (benign prostatic hypertrophy)   . History of urinary retention   . History of transurethral destruction of bladder lesion   . H/O asbestos exposure   . History of DVT (deep vein thrombosis)     APRIL 2011--  UPPER AND LOWER EXTREMITIES  . Bladder tumor     recurrent  . Nocturia   . Heart murmur, systolic     enlarged  . Bladder cancer 10/20/12    high grade papillary/invasive urothelial  . Hx of radiation therapy 01/02/13- 02/19/13    subtotal bladder 4500 cGy 25 sessions, boost 1600 cGy 8 sessions  . Skin cancer 03/2013    left cheek  . Asbestosis   . Fracture   . Hx of radiation therapy 01/02/13- 02/19/13    subtotal bladder 4500 cGy 25 sessions, reduced field boost 1600 cGy 8 sessions    Assessment: 89 yoM admitted 12/8 from SNF with dyspnea and suspected aspiration pneumonia after choking on oatmeal (large amount suctioned from oropharynx) this morning. PMH includes DVT/PE (warfarin anticoagulation), BPH,  bladder and skin cancer, heart murmur, and asbestosis. CXR shows possible aspiration pneumonitis and multiple masses consistent with metastatic disease. Pharmacy is consulted to dose Zosyn.  12/8 >> Zosyn >>  Today, 06/18/2014   Tmax: 99  WBCs: 28.6  Renal: SCr 1.59, CrCl ~ 29 ml/min  Goal of Therapy:  Appropriate abx dosing, eradication of infection.   Plan:   Zosyn 3.375g IV Q8H infused over 4hrs.  Follow up renal function, cultures, and clinical course  Gretta Arab PharmD, BCPS Pager [phone] 06/18/2014 1:27 PM

## 2014-06-18 NOTE — ED Provider Notes (Signed)
Level V caveat, patient dyspneic, hypoxic History is obtained from Pinc[employer] Large, employee at skilled nursing facility. Patient was being fed oatmeal Y his son at 86 AM today when he developed sudden onset dyspnea. Pulse oximetry dropped to 40%. A large amount of oatmeal as suctioned from his oropharynx. EMS was called. EMS treated patient with supplemental oxygen. On exam patient is chronically ill-appearing. Opens eyes to tactile stimulus. Lungs diffuse coarse rhonchi, tachypnea, Pulse oximetry on oxygen 100% nonrebreather mask is 87%, consistent with hypoxemia. BiPAP ordered  10:25 AM spoke with patient's son, healthcare power of attorney and guardian who requests the patient be made comfortable. Wishes to have hospice involved. He agrees to BiPAP therapy antibiotic therapy and lab work Chest x-ray reviewed by me  Zosyn ordered to cover aspiration. Spoke with Dr. Darrick Meigs  Plan admit medical surgical floor.  Palliative Care consult to be obtained. Patient's son request swallowing study to be performed diagnosis #1 aspiration pneumonitis Results for orders placed or performed during the hospital encounter of 54/27/06  Basic metabolic panel  Result Value Ref Range   Sodium 146 137 - 147 mEq/L   Potassium 4.4 3.7 - 5.3 mEq/L   Chloride 110 96 - 112 mEq/L   CO2 23 19 - 32 mEq/L   Glucose, Bld 178 (H) 70 - 99 mg/dL   BUN 52 (H) 6 - 23 mg/dL   Creatinine, Ser 1.59 (H) 0.50 - 1.35 mg/dL   Calcium 12.8 (H) 8.4 - 10.5 mg/dL   GFR calc non Af Amer 35 (L) >90 mL/min   GFR calc Af Amer 41 (L) >90 mL/min   Anion gap 13 5 - 15  CBC with Differential  Result Value Ref Range   WBC 28.6 (H) 4.0 - 10.5 K/uL   RBC 3.56 (L) 4.22 - 5.81 MIL/uL   Hemoglobin 8.6 (L) 13.0 - 17.0 g/dL   HCT 28.8 (L) 39.0 - 52.0 %   MCV 80.9 78.0 - 100.0 fL   MCH 24.2 (L) 26.0 - 34.0 pg   MCHC 29.9 (L) 30.0 - 36.0 g/dL   RDW 20.5 (H) 11.5 - 15.5 %   Platelets 325 150 - 400 K/uL   Neutrophils Relative % 95 (H) 43 - 77 %   Lymphocytes Relative 2 (L) 12 - 46 %   Monocytes Relative 3 3 - 12 %   Eosinophils Relative 0 0 - 5 %   Basophils Relative 0 0 - 1 %   Neutro Abs 27.1 (H) 1.7 - 7.7 K/uL   Lymphs Abs 0.6 (L) 0.7 - 4.0 K/uL   Monocytes Absolute 0.9 0.1 - 1.0 K/uL   Eosinophils Absolute 0.0 0.0 - 0.7 K/uL   Basophils Absolute 0.0 0.0 - 0.1 K/uL   RBC Morphology POLYCHROMASIA PRESENT    Dg Chest Portable 1 View  06/18/2014   CLINICAL DATA:  Aspiration.  Low O2 saturation.  EXAM: PORTABLE CHEST - 1 VIEW  COMPARISON:  10/27/2013  FINDINGS: The patient has not multiple new masses in both lungs consistent with tumor. There is also an ill-defined infiltrate at the right lung base which could represent aspiration pneumonitis.  Extensive pleural calcification is unchanged, consistent with previous asbestos exposure. Heart size and pulmonary vascularity are normal. No acute osseous abnormality.  IMPRESSION: 1. Interval development of numerous masses in both lungs consistent with metastatic disease. 2. Hazy infiltrate at the right lung base may represent aspiration pneumonitis.   Electronically Signed   By: Rozetta Nunnery M.D.   On: 06/18/2014 10:01  Dx #1 aspiration pneumonitis #2 hyperglycemia #3anemia  Orlie Dakin, MD 06/18/14 1243

## 2014-06-18 NOTE — Progress Notes (Signed)
Transported patient to ICU from ED via V60 BiPAP on 100% oxygen.  Patient stable throughout.  Placed in bed and BiPAP plugged back into wall and wall O2.

## 2014-06-18 NOTE — H&P (Signed)
PCP:   Leonard Downing, MD   Chief Complaint:  Aspiration   HPI:  80 year old male who  has a past medical history of History of pulmonary embolus (PE); BPH (benign prostatic hypertrophy); History of urinary retention; History of transurethral destruction of bladder lesion; H/O asbestos exposure; History of DVT (deep vein thrombosis); Bladder tumor; Nocturia; Heart murmur, systolic; Bladder cancer (10/20/12); radiation therapy (01/02/13- 02/19/13); Skin cancer (03/2013); Asbestosis; Fracture; and radiation therapy (01/02/13- 02/19/13). Patient was brought to the ED after he had an aspiration event this morning. As per the son patient was fed oatmeal around 8 AM when he suddenly began choking and developed dyspnea. Patient's pulse ox dropped to 40%. Large amount of oatmeal was suctioned from the oropharynx and EMS was called. Patient found to be in acute respiratory distress, chest x-ray showed aspiration pneumonia and widely metastatic disease. Patient is unable to provide any significant history, currently he is on BiPAP. As per son patient did not have any chest pain, no nausea vomiting or diarrhea. But he has been having difficulty swallowing over the past 5 weeks. He has a history of bladder cancer and was treated with radiation treatment in 2014.  Allergies:   Allergies  Allergen Reactions  . Benadryl [Diphenhydramine Hcl] Other (See Comments)    Urinary retention  . Latex Other (See Comments)    blisters      Past Medical History  Diagnosis Date  . History of pulmonary embolus (PE)     APRIL 2011--  MULTIPLE PE'S AND DVT  . BPH (benign prostatic hypertrophy)   . History of urinary retention   . History of transurethral destruction of bladder lesion   . H/O asbestos exposure   . History of DVT (deep vein thrombosis)     APRIL 2011--  UPPER AND LOWER EXTREMITIES  . Bladder tumor     recurrent  . Nocturia   . Heart murmur, systolic     enlarged  . Bladder cancer 10/20/12    high grade papillary/invasive urothelial  . Hx of radiation therapy 01/02/13- 02/19/13    subtotal bladder 4500 cGy 25 sessions, boost 1600 cGy 8 sessions  . Skin cancer 03/2013    left cheek  . Asbestosis   . Fracture   . Hx of radiation therapy 01/02/13- 02/19/13    subtotal bladder 4500 cGy 25 sessions, reduced field boost 1600 cGy 8 sessions    Past Surgical History  Procedure Laterality Date  . Repair recurrent left inguinal hernia  06-01-1999    x 2  . Transurethral resection of prostate  1999   &  11-17-2000  . Cysto/ eua/ placement suprapubic tube  01-13-2000  . Transurethral resection of bladder tumor  06-30-2010  . Transurethral resection of prostate  07-31-2010    AND BLADDER RESECTION BLADDER TUMOR  . Vena cava filter placement  APRIL 2011    INFERIOR  . Transthoracic echocardiogram  10-11-2012   DR TILLEY    CONCENTRIC LVH WITH NORMAL LVSF/ EF 61%/ GRADE I DIASTOLIC DYSFUNCTION  . Transurethral resection of bladder tumor N/A 10/20/2012    Procedure: TRANSURETHRAL RESECTION OF BLADDER TUMOR (TURBT);  Surgeon: Hanley Ben, MD;  Location: [facility];  Service: Urology;  Laterality: N/A;  . Cystoscopy N/A 10/20/2012    Procedure: CYSTOSCOPY;  Surgeon: Hanley Ben, MD;  Location: [facility];  Service: Urology;  Laterality: N/A;  . Cataract surgery      BILAT. EYES  . Mohs surgery  03/2013  left cheek  . Hip arthroplasty Left 05/01/2013    Procedure: ARTHROPLASTY BIPOLAR HIP;  Surgeon: Renette Butters, MD;  Location: [facility];  Service: Orthopedics;  Laterality: Left;  . Esophagogastroduodenoscopy N/A 10/27/2013    Procedure: ESOPHAGOGASTRODUODENOSCOPY (EGD);  Surgeon: Beryle Beams, MD;  Location: [facility] ENDOSCOPY;  Service: Endoscopy;  Laterality: N/A;    Prior to Admission medications   Medication Sig Start Date End Date Taking? Authorizing Provider  acetaminophen (TYLENOL) 500 MG tablet Take 1-2 tablets (500-1,000 mg total) by mouth  every 6 (six) hours as needed for moderate pain. 10/31/13  Yes Lucious Groves, DO  Cholecalciferol (VITAMIN D) 2000 UNITS CAPS Take 2,000 Units by mouth daily.   Yes Historical Provider, MD  Cranberry 450 MG CAPS Take 900 mg by mouth daily.   Yes Historical Provider, MD  ferrous sulfate 325 (65 FE) MG EC tablet Take 325 mg by mouth daily with breakfast.   Yes Historical Provider, MD  finasteride (PROSCAR) 5 MG tablet Take 5 mg by mouth daily.   Yes Historical Provider, MD  furosemide (LASIX) 40 MG tablet Take 20 mg by mouth daily.    Yes Historical Provider, MD  Omega-3 Fatty Acids (FISH OIL) 1000 MG CAPS Take 1,000 mg by mouth daily.    Yes Historical Provider, MD  pantoprazole (PROTONIX) 40 MG tablet Take 40 mg by mouth daily.   Yes Historical Provider, MD  traZODone (DESYREL) 50 MG tablet Take 25 mg by mouth at bedtime.   Yes Historical Provider, MD  warfarin (COUMADIN) 3 MG tablet Take 3 mg by mouth daily. Takes with the 5mg  tablet to make a total of 8mg  per day   Yes Historical Provider, MD  warfarin (COUMADIN) 5 MG tablet Take 5 mg by mouth daily. Takes with the 3mg  tablet to make a total of 8mg  per day   Yes Historical Provider, MD  acidophilus (RISAQUAD) CAPS capsule Take 1 capsule by mouth every Monday, Wednesday, and Friday.    Historical Provider, MD  feeding supplement, ENSURE COMPLETE, (ENSURE COMPLETE) LIQD Take 237 mLs by mouth daily with lunch. 10/31/13   Lucious Groves, DO  magnesium hydroxide (MILK OF MAGNESIA) 400 MG/5ML suspension Take 30 mLs by mouth daily as needed for mild constipation.     Historical Provider, MD  potassium chloride SA (K-DUR,KLOR-CON) 20 MEQ tablet Take 20 mEq by mouth every Monday, Wednesday, and Friday.    Historical Provider, MD    Social History:  reports that he has never smoked. He has never used smokeless tobacco. He reports that he drinks alcohol. He reports that he does not use illicit drugs.    All the positives are listed in BOLD  Review of  Systems:  Unobtainable, rest as the history of present illness  Physical Exam: Blood pressure 110/60, pulse 116, temperature 99 F (37.2 C), temperature source Rectal, resp. rate 37, SpO2 99 %. Constitutional:   Patient is a ill-appearing male in significant respiratory distress Cardiovascular: RRR, S1 normal, S2 normal Pulmonary/Chest: Bilateral rhonchi Abdominal: Soft. Non-tender, non-distended, bowel sounds are normal, no masses, organomegaly, or guarding present.  Neurological: Obtunded, barely opens eyes to tactile stim right. Strength is normal and symmetric bilaterally, cranial nerve II-XII are grossly intact, no focal motor deficit, sensory intact to light touch bilaterally.  Extremities : No Cyanosis, Clubbing or Edema  Labs on Admission:  Basic Metabolic Panel:  Recent Labs Lab 06/18/14 1041  NA 146  K 4.4  CL 110  CO2 23  GLUCOSE 178*  BUN 52*  CREATININE 1.59*  CALCIUM 12.8*   Liver Function Tests: No results for input(s): AST, ALT, ALKPHOS, BILITOT, PROT, ALBUMIN in the last 168 hours. No results for input(s): LIPASE, AMYLASE in the last 168 hours. No results for input(s): AMMONIA in the last 168 hours. CBC:  Recent Labs Lab 06/18/14 1041  WBC 28.6*  NEUTROABS 27.1*  HGB 8.6*  HCT 28.8*  MCV 80.9  PLT 325    Radiological Exams on Admission: Dg Chest Portable 1 View  06/18/2014   CLINICAL DATA:  Aspiration.  Low O2 saturation.  EXAM: PORTABLE CHEST - 1 VIEW  COMPARISON:  10/27/2013  FINDINGS: The patient has not multiple new masses in both lungs consistent with tumor. There is also an ill-defined infiltrate at the right lung base which could represent aspiration pneumonitis.  Extensive pleural calcification is unchanged, consistent with previous asbestos exposure. Heart size and pulmonary vascularity are normal. No acute osseous abnormality.  IMPRESSION: 1. Interval development of numerous masses in both lungs consistent with metastatic disease. 2. Hazy  infiltrate at the right lung base may represent aspiration pneumonitis.   Electronically Signed   By: Rozetta Nunnery M.D.   On: 06/18/2014 10:01    EKG: Independently reviewed. Sinus tachycardia   Assessment/Plan Active Problems:   Aspiration pneumonitis   Aspiration pneumonia   Sepsis   Hx of bladder cancer  Sepsis Patient is presenting with leukocytosis, hypotension, tachypnea, altered mental status after he had aspiration event leading to aspiration pneumonia. Patient started on IV Zosyn, continue BiPAP.  History of bladder cancer Patient's chest x-ray shows interval development of widely metastatic disease, he has history of bladder cancer.  Goals of care- discussed with patient's son in detail, he would like to get palliative care consultation and also would go for comfort care approach. Patient is DO NOT RESUSCITATE, I will start morphine 2 mg IV every 4 hours scheduled. History wants IV antibiotics. Which will be continued as per pharmacy. I'm going to hold all the by mouth medications including the warfarin.   Code status: DNR   Time Spent on Admission: 60 min  [facility] Pager: [phone] 06/18/2014, 1:12 PM  If 7PM-7AM, please contact night-coverage  [email or web address]  Password [email or web address]

## 2014-06-18 NOTE — ED Provider Notes (Signed)
CSN: [id number]     Arrival date & time 06/18/14  0920 History   First MD Initiated Contact with Patient 06/18/14 [phone]     Chief Complaint  Patient presents with  . Aspiration    HPI [name] is a 80 year old male with a history of bladder cancer s/p XRT and DVT/PE who presents today with hypoxia 2/2 aspiration. At baseline, he is unable to respond, so information was collected by talking to Pincus Large at [facility]. She reports that their was suspicion that he was aspirating for the last several days. Swallow eval was recommended, but the family declined. This morning, his son was feeding him oatmeal before he became dyspneic.   Past Medical History  Diagnosis Date  . History of pulmonary embolus (PE)     APRIL 2011--  MULTIPLE PE'S AND DVT  . BPH (benign prostatic hypertrophy)   . History of urinary retention   . History of transurethral destruction of bladder lesion   . H/O asbestos exposure   . History of DVT (deep vein thrombosis)     APRIL 2011--  UPPER AND LOWER EXTREMITIES  . Bladder tumor     recurrent  . Nocturia   . Heart murmur, systolic     enlarged  . Bladder cancer 10/20/12    high grade papillary/invasive urothelial  . Hx of radiation therapy 01/02/13- 02/19/13    subtotal bladder 4500 cGy 25 sessions, boost 1600 cGy 8 sessions  . Skin cancer 03/2013    left cheek  . Asbestosis   . Fracture   . Hx of radiation therapy 01/02/13- 02/19/13    subtotal bladder 4500 cGy 25 sessions, reduced field boost 1600 cGy 8 sessions   Past Surgical History  Procedure Laterality Date  . Repair recurrent left inguinal hernia  06-01-1999    x 2  . Transurethral resection of prostate  1999   &  11-17-2000  . Cysto/ eua/ placement suprapubic tube  01-13-2000  . Transurethral resection of bladder tumor  06-30-2010  . Transurethral resection of prostate  07-31-2010    AND BLADDER RESECTION BLADDER TUMOR  . Vena cava filter placement  APRIL 2011    INFERIOR  . Transthoracic  echocardiogram  10-11-2012   DR TILLEY    CONCENTRIC LVH WITH NORMAL LVSF/ EF 44%/ GRADE I DIASTOLIC DYSFUNCTION  . Transurethral resection of bladder tumor N/A 10/20/2012    Procedure: TRANSURETHRAL RESECTION OF BLADDER TUMOR (TURBT);  Surgeon: Hanley Ben, MD;  Location: [facility];  Service: Urology;  Laterality: N/A;  . Cystoscopy N/A 10/20/2012    Procedure: CYSTOSCOPY;  Surgeon: Hanley Ben, MD;  Location: [facility];  Service: Urology;  Laterality: N/A;  . Cataract surgery      BILAT. EYES  . Mohs surgery  03/2013    left cheek  . Hip arthroplasty Left 05/01/2013    Procedure: ARTHROPLASTY BIPOLAR HIP;  Surgeon: Renette Butters, MD;  Location: [place];  Service: Orthopedics;  Laterality: Left;  . Esophagogastroduodenoscopy N/A 10/27/2013    Procedure: ESOPHAGOGASTRODUODENOSCOPY (EGD);  Surgeon: Beryle Beams, MD;  Location: [facility] ENDOSCOPY;  Service: Endoscopy;  Laterality: N/A;   No family history on file. History  Substance Use Topics  . Smoking status: Never Smoker   . Smokeless tobacco: Never Used  . Alcohol Use: Yes    Review of Systems  Unable to perform ROS     Allergies  Benadryl and Latex  Home Medications   Prior to Admission medications  Medication Sig Start Date End Date Taking? Authorizing Provider  acetaminophen (TYLENOL) 500 MG tablet Take 1-2 tablets (500-1,000 mg total) by mouth every 6 (six) hours as needed for moderate pain. 10/31/13  Yes Lucious Groves, DO  Cholecalciferol (VITAMIN D) 2000 UNITS CAPS Take 2,000 Units by mouth daily.   Yes Historical Provider, MD  Cranberry 450 MG CAPS Take 900 mg by mouth daily.   Yes Historical Provider, MD  ferrous sulfate 325 (65 FE) MG EC tablet Take 325 mg by mouth daily with breakfast.   Yes Historical Provider, MD  finasteride (PROSCAR) 5 MG tablet Take 5 mg by mouth daily.   Yes Historical Provider, MD  furosemide (LASIX) 40 MG tablet Take 20 mg by mouth daily.    Yes  Historical Provider, MD  Omega-3 Fatty Acids (FISH OIL) 1000 MG CAPS Take 1,000 mg by mouth daily.    Yes Historical Provider, MD  pantoprazole (PROTONIX) 40 MG tablet Take 40 mg by mouth daily.   Yes Historical Provider, MD  traZODone (DESYREL) 50 MG tablet Take 25 mg by mouth at bedtime.   Yes Historical Provider, MD  warfarin (COUMADIN) 3 MG tablet Take 3 mg by mouth daily. Takes with the 5mg  tablet to make a total of 8mg  per day   Yes Historical Provider, MD  warfarin (COUMADIN) 5 MG tablet Take 5 mg by mouth daily. Takes with the 3mg  tablet to make a total of 8mg  per day   Yes Historical Provider, MD  acidophilus (RISAQUAD) CAPS capsule Take 1 capsule by mouth every Monday, Wednesday, and Friday.    Historical Provider, MD  feeding supplement, ENSURE COMPLETE, (ENSURE COMPLETE) LIQD Take 237 mLs by mouth daily with lunch. 10/31/13   Lucious Groves, DO  magnesium hydroxide (MILK OF MAGNESIA) 400 MG/5ML suspension Take 30 mLs by mouth daily as needed for mild constipation.     Historical Provider, MD  potassium chloride SA (K-DUR,KLOR-CON) 20 MEQ tablet Take 20 mEq by mouth every Monday, Wednesday, and Friday.    Historical Provider, MD   BP 101/54 mmHg  Pulse 109  Temp(Src) 99 F (37.2 C) (Rectal)  Resp 34  Ht 5\' 8"  (1.727 m)  Wt 176 lb (79.833 kg)  BMI 26.77 kg/m2  SpO2 99% Physical Exam  Constitutional: He appears distressed.  Minimally responsive at baseline. Wearing non-rebreather mask.  HENT:  Head: Normocephalic and atraumatic.  Eyes: Conjunctivae are normal. Pupils are equal, round, and reactive to light.  Eyes closed at baseline.  Cardiovascular: Regular rhythm and normal heart sounds.  Exam reveals no gallop and no friction rub.   No murmur heard. Tachycardic  Pulmonary/Chest: He is in respiratory distress.  Rhonchi all throughout.  Musculoskeletal: He exhibits edema.  L > R with 2+ pitting edema  Neurological:  Able to turn head when name is called though is otherwise  unable to open his eyes.   Skin: He is not diaphoretic.    ED Course  Procedures (including critical care time) Labs Review Labs Reviewed  BASIC METABOLIC PANEL - Abnormal; Notable for the following:    Glucose, Bld 178 (*)    BUN 52 (*)    Creatinine, Ser 1.59 (*)    Calcium 12.8 (*)    GFR calc non Af Amer 35 (*)    GFR calc Af Amer 41 (*)    All other components within normal limits  CBC WITH DIFFERENTIAL - Abnormal; Notable for the following:    WBC 28.6 (*)  RBC 3.56 (*)    Hemoglobin 8.6 (*)    HCT 28.8 (*)    MCH 24.2 (*)    MCHC 29.9 (*)    RDW 20.5 (*)    Neutrophils Relative % 95 (*)    Lymphocytes Relative 2 (*)    Neutro Abs 27.1 (*)    Lymphs Abs 0.6 (*)    All other components within normal limits    Imaging Review Dg Chest Portable 1 View  06/18/2014   CLINICAL DATA:  Aspiration.  Low O2 saturation.  EXAM: PORTABLE CHEST - 1 VIEW  COMPARISON:  10/27/2013  FINDINGS: The patient has not multiple new masses in both lungs consistent with tumor. There is also an ill-defined infiltrate at the right lung base which could represent aspiration pneumonitis.  Extensive pleural calcification is unchanged, consistent with previous asbestos exposure. Heart size and pulmonary vascularity are normal. No acute osseous abnormality.  IMPRESSION: 1. Interval development of numerous masses in both lungs consistent with metastatic disease. 2. Hazy infiltrate at the right lung base may represent aspiration pneumonitis.   Electronically Signed   By: Rozetta Nunnery M.D.   On: 06/18/2014 10:01     EKG Interpretation   Date/Time:  Tuesday June 18 2014 09:36:33 EST Ventricular Rate:  122 PR Interval:  128 QRS Duration: 150 QT Interval:  348 QTC Calculation: 496 R Axis:   -72 Text Interpretation:  Sinus tachycardia Left bundle branch block Confirmed  by JACUBOWITZ  MD, SAM ([id number]) on 06/18/2014 10:10:17 AM      MDM   Final diagnoses:  Aspiration pneumonitis    Hypoxia  likely 2/2 aspiration PNA as confirmed by CXR findings. Per his son [name] & legal guardian), IV antibiotics & BiPAP is in line with goals of care. Start IV Zosyn with plan to admit, and dispo pending resolution of his acute illness.  Charlott Rakes, MD 06/18/14 [place], MD 06/18/14 2025

## 2014-06-18 NOTE — ED Notes (Addendum)
Per EMS pt comes from [place] home with low oxygen saturation after aspirating on oatmeal. Pt was O2s in 60's, pt on NRB pt O2 sat 87-89%.   Pt alert to loud stimuli per EMS, which is not usually pt's normal baseline. Pt has healed to decub on right heel.

## 2014-06-18 NOTE — Progress Notes (Signed)
Put patient on BiPAP 12/6 50% rate of 10.  Patient RR is 27-33, HR 115, PIP 13 with bilateral breath sounds of rhonci.  Patient has aspirated.

## 2014-06-19 DIAGNOSIS — C671 Malignant neoplasm of dome of bladder: Secondary | ICD-10-CM

## 2014-06-19 DIAGNOSIS — J9600 Acute respiratory failure, unspecified whether with hypoxia or hypercapnia: Secondary | ICD-10-CM

## 2014-06-19 DIAGNOSIS — C7800 Secondary malignant neoplasm of unspecified lung: Secondary | ICD-10-CM | POA: Diagnosis present

## 2014-06-19 DIAGNOSIS — J9601 Acute respiratory failure with hypoxia: Secondary | ICD-10-CM

## 2014-06-19 DIAGNOSIS — Z7189 Other specified counseling: Secondary | ICD-10-CM

## 2014-06-19 DIAGNOSIS — D72829 Elevated white blood cell count, unspecified: Secondary | ICD-10-CM | POA: Diagnosis present

## 2014-06-19 DIAGNOSIS — N289 Disorder of kidney and ureter, unspecified: Secondary | ICD-10-CM

## 2014-06-19 DIAGNOSIS — D638 Anemia in other chronic diseases classified elsewhere: Secondary | ICD-10-CM | POA: Diagnosis present

## 2014-06-19 DIAGNOSIS — R0600 Dyspnea, unspecified: Secondary | ICD-10-CM

## 2014-06-19 MED ORDER — MORPHINE SULFATE 2 MG/ML IJ SOLN
2.0000 mg | INTRAMUSCULAR | Status: DC | PRN
  Administered 2014-06-19 (×2): 2 mg via INTRAVENOUS
  Filled 2014-06-19 (×2): qty 1

## 2014-06-19 NOTE — Plan of Care (Signed)
Problem: Phase I Progression Outcomes Goal: Voiding-avoid urinary catheter unless indicated Outcome: Completed/Met Date Met:  06/19/14     

## 2014-06-19 NOTE — Plan of Care (Signed)
Problem: Phase I Progression Outcomes Goal: Pain controlled with appropriate interventions Outcome: Completed/Met Date Met:  06/19/14 Goal: Non-pain symptoms managed Outcome: Completed/Met Date Met:  06/19/14 Goal: Oral assessment and care per protocol Outcome: Completed/Met Date Met:  06/19/14 Goal: Voiding-avoid urinary catheter unless indicated Outcome: Progressing  Problem: Phase II Progression Outcomes Goal: Pain within acceptable level for patient Outcome: Completed/Met Date Met:  06/19/14 Goal: Pt./family involved in care progression Outcome: Completed/Met Date Met:  06/19/14

## 2014-06-19 NOTE — Progress Notes (Signed)
Patient ID: Omar [name], male   DOB: 1915-11-07, 80 y.o.   MRN: [id number] [facility] PROGRESS NOTE  DORR PERROT WLN:[id number] DOB: 01/03/1916 DOA: 06/18/2014 PCP: Leonard Downing, MD  Brief narrative:    80 year old male with past medical history of pulmonary embolism, DVT, history of bladder cancer and transurethral destruction of bladder lesion, status post radiation treatment from 01/02/13- 02/19/13,  BPH, history of asbestos exposure. Patient presented to [facility] ED DUE to worsening decline over past 1 week prior to this admission. Patient's son is at the bedside say that patient's wife died in 08/29/2022 of this year and his nephew died in a Oct 28, 2022 of this year and ever since then his overall physical health declined. They have noticed their father to be more confused with more difficulty breathing over past 1 week prior to this admission. He had trouble clearing the secretions. In ED, patient was found to be severely hypoxic with oxygen saturation in 60's on room air. His oxygen saturation improved with BiPAP. Other vital signs included hypotension, blood pressure 66/33, respiratory rate is high as 40, heart rate ranging from 36-127, T max 101.1 F. Blood work revealed leukocytosis of 28.6, hemoglobin 8.6, creatinine 1.59 and calcium 12.8. Chest x-ray showed interval development of numerous masses in both lungs consistent with metastatic disease, hazy infiltrate at the right lung base which may represent aspiration pneumonitis. Patient was started on Zosyn for treatment of sepsis related to aspiration pneumonia. Patient was admitted to stepdown unit due to requirement for BiPAP.  Assessment/Plan:    Principal Problem: Severe sepsis secondary to aspiration pneumonia /  Leukocytosis - Sepsis criteria met on the admission with vital signs that included severe hypotension with blood pressure of 66/33, tachycardia, tachypnea, fever, evidence of end organ failure which includes hypoxia of  66% on room air. Source of infection, aspiration pneumonia based on chest x-ray. - Patient started on Zosyn for treatment of aspiration pneumonia - This morning patient still requires BiPAP. Appreciate the respiratory team trying to wean BiPAP and perhaps switch to Ventimask if oxygen saturation still above 90%. Currently on BiPAP, oxygen saturation is 99% - Aspiration precautions Active Problems: Acute respiratory failure with hypoxia - Respiratory failure with hypoxia secondary to aspiration pneumonia. - Patient requires BiPAP to keep oxygen saturation above 90%. We will try to switch to Ventimask if possible today. - No respiratory distress  History of bladder cancer / new lung metastasis - Patient had history of bladder cancer. Chest x-ray on this admission shows interval development of numerous masses in both lungs consistent with metastatic disease. Unclear how long this has been present but based on chest x-ray in 10-27-2013 there was no evidence of metastatic disease at that time so this must be something that has developed relatively recently. - Patient's family at the bedside confirmed that patient did not want any aggressive treatments. They would like to see how treatment with antibiotics goes over next 24 hours but if no significant improvement they would like to focus on comfort care.  Acute renal failure - Likely secondary to sepsis - We will continue IV fluids for now  Hypercalcemia - Likely secondary to lung malignancy versus dehydration - Continue IV fluids - We will obtain blood work in the morning  Anemia of chronic disease - Secondary to history of malignancy  - Hemoglobin is 8.6. No current indications for transfusion   DVT Prophylaxis  - SCDs bilaterally.  Code Status: DNR/DNI Family Communication:  plan of care discussed with  the patient Disposition Plan: Home when stable.   IV access:   Peripheral IV  Procedures and diagnostic studies:    Dg Chest  Portable 1 View 06/18/2014    1. Interval development of numerous masses in both lungs consistent with metastatic disease. 2. Hazy infiltrate at the right lung base may represent aspiration pneumonitis.   Electronically Signed   By: Rozetta Nunnery M.D.   On: 06/18/2014 10:01   Medical Consultants:   Palliative care  Other Consultants:   None  IAnti-Infectives:    Zosyn 06/18/2014 -->   Leisa Lenz, MD  [facility] Pager [phone]  If 7PM-7AM, please contact night-coverage [email or web address] Password [email or web address] 06/19/2014, 10:38 AM   LOS: 1 day    HPI/Subjective: No acute overnight events.  Objective: Filed Vitals:   06/19/14 0512 06/19/14 0600 06/19/14 0745 06/19/14 0935  BP:  81/39    Pulse:  81    Temp: 98 F (36.7 C)  97.7 F (36.5 C)   TempSrc: Axillary  Axillary   Resp:  18  20  Height:      Weight:      SpO2:  100%      Intake/Output Summary (Last 24 hours) at 06/19/14 1038 Last data filed at 06/19/14 0600  Gross per 24 hour  Intake   1053 ml  Output    225 ml  Net    828 ml    Exam:   General:  Pt is on BiPAP, no acute distress  Cardiovascular: Tachycardic, appreciate S1/S2  Respiratory: Diminished, rhonchi throughout, coarse breath sounds  Abdomen: non tender, non distended, bowel sounds present  Extremities: pulses DP and PT palpable bilaterally  Neuro: Grossly nonfocal  Data Reviewed: Basic Metabolic Panel:  Recent Labs Lab 06/18/14 1041  NA 146  K 4.4  CL 110  CO2 23  GLUCOSE 178*  BUN 52*  CREATININE 1.59*  CALCIUM 12.8*   Liver Function Tests: No results for input(s): AST, ALT, ALKPHOS, BILITOT, PROT, ALBUMIN in the last 168 hours. No results for input(s): LIPASE, AMYLASE in the last 168 hours. No results for input(s): AMMONIA in the last 168 hours. CBC:  Recent Labs Lab 06/18/14 1041  WBC 28.6*  NEUTROABS 27.1*  HGB 8.6*  HCT 28.8*  MCV 80.9  PLT 325   Cardiac Enzymes: No results for input(s): CKTOTAL, CKMB,  CKMBINDEX, TROPONINI in the last 168 hours. BNP: Invalid input(s): POCBNP CBG: No results for input(s): GLUCAP in the last 168 hours.  Recent Results (from the past 240 hour(s))  MRSA PCR Screening     Status: None   Collection Time: 06/18/14  3:02 PM  Result Value Ref Range Status   MRSA by PCR NEGATIVE NEGATIVE Final     Scheduled Meds: .  morphine injection  2 mg Intravenous Q4H  . piperacillin-tazobactam (ZOSYN)  IV  3.375 g Intravenous Q8H   Continuous Infusions: . sodium chloride 75 mL/hr at 06/19/14 0600        She doesn't think he is a

## 2014-06-19 NOTE — Consult Note (Signed)
Patient HQ:Omar [name]      DOB: [birth year]      XBM:[id number]     Consult Note from the Palliative Medicine Team at [facility] Requested by: Dr Darrick Meigs     PCP: Leonard Downing, MD Reason for Consultation: Clarification of [place] and options     Phone [phone]  Assessment of patients Current state:  80 yo old male with past medical history of pulmonary embolism, DVT, history of bladder cancer and transurethral destruction of bladder lesion, status post radiation treatment from 01/02/13- 02/19/13, BPH, history of asbestos exposure. Patient presented to [facility] ED due to worsening decline over past 1 week prior to this admission.    In ED, patient was found to be severely hypoxic with oxygen saturation in 60's on room air. His oxygen saturation improved with BiPAP. Other vital signs included hypotension, blood pressure 66/33, respiratory rate is high as 40, heart rate ranging from 36-127, T max 101.1 F. Blood work revealed leukocytosis of 28.6, hemoglobin 8.6, creatinine 1.59 and calcium 12.8. Chest x-ray showed interval development of numerous masses in both lungs consistent with metastatic disease, hazy infiltrate at the right lung base which may represent aspiration pneumonitis. Patient was started on Zosyn for treatment of sepsis related to aspiration pneumonia.   Continues to decline in spite of aggressive medical interventions, family faced with EOL decisions and anticipatory care needs.    Consult is for review of medical treatment options, clarification of goals of care and end of life issues, disposition and options, and symptom recommendation.  This NP Wadie Lessen reviewed medical records, received report from team, assessed the patient and then meet at the patient's bedside along with his two sons  to discuss diagnosis, prognosis, GOC, EOL wishes, disposition and options.  A detailed discussion was had today regarding advanced directives.  Concepts specific to  code status, artifical feeding and hydration, continued IV antibiotics and rehospitalization was had.  The difference between a aggressive medical intervention path  and a palliative comfort care path for this patient at this time was had.  Values and goals of care important to patient and family were attempted to be elicited.  Concept of Hospice and Palliative Care were discussed  Natural trajectory and expectations at EOL were discussed.  Questions and concerns addressed.  Hard Choices booklet left for review. Family encouraged to call with questions or concerns.  PMT will continue to support holistically.   Goals of Care: 1.  Code Status: DNR/DNI   2. Scope of Treatment:    At this time family wish to continue with the current treatment plan "for another 24 hrs to see if things  get better".  They ultimately want comfort to be the focus of care and they understand they are faced with the decision to de-escalate care in order to enhance patient centered comfort care.  Other family members are coming in from out of town tonight.   Family will continue to work with PMT navigating decisions and care options.   3. Disposition:  Dependant on outcomes,  a hospital death is expected   4. Symptom Management:    Anxiety/Agitation: Ativan 2 mg IV every 4 hrs prn  Pain/Dyspnea: Morphine 2 mg every 2  hrs   5. Psychosocial:    Patient has experienced much loss over the past year, both his wife and nephew died in the last 28 months. Emotional support offered to family at bedside.  6. Spiritual:  Strong community church support,  declines chaplain trigger   Patient Documents Completed or Given: Document Given Completed  Advanced Directives Pkt    MOST    DNR    Gone from My Sight    Hard Choices X     Brief HPI:  80 yo old male with past medical history of pulmonary embolism, DVT, history of bladder cancer and transurethral destruction of bladder lesion, status post radiation  treatment from 01/02/13- 02/19/13, BPH, history of asbestos exposure. Patient presented to [facility] ED DUE to worsening decline over past 1 week prior to this admission.They have noticed their father to be more confused with more difficulty breathing over past 1 week prior to this admission. He had trouble clearing the secretions. In ED, patient was found to be severely hypoxic with oxygen saturation in 60's on room air. His oxygen saturation improved with BiPAP. Other vital signs included hypotension, blood pressure 66/33, respiratory rate is high as 40, heart rate ranging from 36-127, T max 101.1 F. Blood work revealed leukocytosis of 28.6, hemoglobin 8.6, creatinine 1.59 and calcium 12.8. Chest x-ray showed interval development of numerous masses in both lungs consistent with metastatic disease, hazy infiltrate at the right lung base which may represent aspiration pneumonitis. Patient was started on Zosyn for treatment of sepsis related to aspiration pneumonia. Patient was admitted to stepdown unit    ROS: unable to illicit due to decreased cognition,    PMH:  Past Medical History  Diagnosis Date  . History of pulmonary embolus (PE)     APRIL 2011--  MULTIPLE PE'S AND DVT  . BPH (benign prostatic hypertrophy)   . History of urinary retention   . History of transurethral destruction of bladder lesion   . H/O asbestos exposure   . History of DVT (deep vein thrombosis)     APRIL 2011--  UPPER AND LOWER EXTREMITIES  . Bladder tumor     recurrent  . Nocturia   . Heart murmur, systolic     enlarged  . Bladder cancer 10/20/12    high grade papillary/invasive urothelial  . Hx of radiation therapy 01/02/13- 02/19/13    subtotal bladder 4500 cGy 25 sessions, boost 1600 cGy 8 sessions  . Skin cancer 03/2013    left cheek  . Asbestosis   . Fracture   . Hx of radiation therapy 01/02/13- 02/19/13    subtotal bladder 4500 cGy 25 sessions, reduced field boost 1600 cGy 8 sessions     PSH: Past Surgical  History  Procedure Laterality Date  . Repair recurrent left inguinal hernia  06-01-1999    x 2  . Transurethral resection of prostate  1999   &  11-17-2000  . Cysto/ eua/ placement suprapubic tube  01-13-2000  . Transurethral resection of bladder tumor  06-30-2010  . Transurethral resection of prostate  07-31-2010    AND BLADDER RESECTION BLADDER TUMOR  . Vena cava filter placement  APRIL 2011    INFERIOR  . Transthoracic echocardiogram  10-11-2012   DR TILLEY    CONCENTRIC LVH WITH NORMAL LVSF/ EF 40%/ GRADE I DIASTOLIC DYSFUNCTION  . Transurethral resection of bladder tumor N/A 10/20/2012    Procedure: TRANSURETHRAL RESECTION OF BLADDER TUMOR (TURBT);  Surgeon: Hanley Ben, MD;  Location: [facility];  Service: Urology;  Laterality: N/A;  . Cystoscopy N/A 10/20/2012    Procedure: CYSTOSCOPY;  Surgeon: Hanley Ben, MD;  Location: [facility];  Service: Urology;  Laterality: N/A;  . Cataract surgery      BILAT.  EYES  . Mohs surgery  03/2013    left cheek  . Hip arthroplasty Left 05/01/2013    Procedure: ARTHROPLASTY BIPOLAR HIP;  Surgeon: Renette Butters, MD;  Location: [place];  Service: Orthopedics;  Laterality: Left;  . Esophagogastroduodenoscopy N/A 10/27/2013    Procedure: ESOPHAGOGASTRODUODENOSCOPY (EGD);  Surgeon: Beryle Beams, MD;  Location[facility];  Service: Endoscopy;  Laterality: N/A;   I have reviewed the [place] and SH and  If appropriate update it with new information. Allergies  Allergen Reactions  . Benadryl [Diphenhydramine Hcl] Other (See Comments)    Urinary retention  . Latex Other (See Comments)    blisters  . Nsaids     unknown   Scheduled Meds: . sodium chloride   Intravenous STAT  . antiseptic oral rinse  7 mL Mouth Rinse q12n4p  . chlorhexidine  15 mL Mouth Rinse BID  .  morphine injection  2 mg Intravenous Q4H  . piperacillin-tazobactam (ZOSYN)  IV  3.375 g Intravenous Q8H  . sodium chloride  3 mL Intravenous Q12H    Continuous Infusions: . sodium chloride 75 mL/hr at 06/19/14 0600   PRN Meds:.sodium chloride, LORazepam, sodium chloride    BP 81/39 mmHg  Pulse 81  Temp(Src) 97.7 F (36.5 C) (Axillary)  Resp 18  Ht 5\' 8"  (1.727 m)  Wt 79.833 kg (176 lb)  BMI 26.77 kg/m2  SpO2 100%   PPS: 20 % at best   Intake/Output Summary (Last 24 hours) at 06/19/14 0947 Last data filed at 06/19/14 0600  Gross per 24 hour  Intake   1053 ml  Output    225 ml  Net    828 ml     Physical Exam:  General: critically ill, BiPap continues Chest:  Diminished in bases CVS: tachycardic Abd: soft decreased BS Neuro: minimally responsive to gentle touch and verbal stimuli Skin: warm and dry, no mottling noted  Labs: CBC    Component Value Date/Time   WBC 28.6* 06/18/2014 1041   WBC 8.9 07/26/2013 1348   RBC 3.56* 06/18/2014 1041   RBC 3.81* 07/26/2013 1348   HGB 8.6* 06/18/2014 1041   HGB 10.8* 07/26/2013 1348   HCT 28.8* 06/18/2014 1041   HCT 32.9* 07/26/2013 1348   PLT 325 06/18/2014 1041   PLT 281 07/26/2013 1348   MCV 80.9 06/18/2014 1041   MCV 86.4 07/26/2013 1348   MCH 24.2* 06/18/2014 1041   MCH 28.3 07/26/2013 1348   MCHC 29.9* 06/18/2014 1041   MCHC 32.8 07/26/2013 1348   RDW 20.5* 06/18/2014 1041   RDW 16.1* 07/26/2013 1348   LYMPHSABS 0.6* 06/18/2014 1041   LYMPHSABS 1.0 07/26/2013 1348   MONOABS 0.9 06/18/2014 1041   MONOABS 0.5 07/26/2013 1348   EOSABS 0.0 06/18/2014 1041   EOSABS 0.2 07/26/2013 1348   BASOSABS 0.0 06/18/2014 1041   BASOSABS 0.0 07/26/2013 1348    BMET    Component Value Date/Time   NA 146 06/18/2014 1041   NA 140 07/26/2013 1348   K 4.4 06/18/2014 1041   K 4.2 07/26/2013 1348   CL 110 06/18/2014 1041   CL 106 12/22/2012 1000   CO2 23 06/18/2014 1041   CO2 28 07/26/2013 1348   GLUCOSE 178* 06/18/2014 1041   GLUCOSE 109 07/26/2013 1348   GLUCOSE 107* 12/22/2012 1000   BUN 52* 06/18/2014 1041   BUN 29.3* 07/26/2013 1348   CREATININE 1.59*  06/18/2014 1041   CREATININE 1.0 07/26/2013 1348   CALCIUM 12.8* 06/18/2014 1041  CALCIUM 8.9 07/26/2013 1348   GFRNONAA 35* 06/18/2014 1041   GFRAA 41* 06/18/2014 1041    CMP     Component Value Date/Time   NA 146 06/18/2014 1041   NA 140 07/26/2013 1348   K 4.4 06/18/2014 1041   K 4.2 07/26/2013 1348   CL 110 06/18/2014 1041   CL 106 12/22/2012 1000   CO2 23 06/18/2014 1041   CO2 28 07/26/2013 1348   GLUCOSE 178* 06/18/2014 1041   GLUCOSE 109 07/26/2013 1348   GLUCOSE 107* 12/22/2012 1000   BUN 52* 06/18/2014 1041   BUN 29.3* 07/26/2013 1348   CREATININE 1.59* 06/18/2014 1041   CREATININE 1.0 07/26/2013 1348   CALCIUM 12.8* 06/18/2014 1041   CALCIUM 8.9 07/26/2013 1348   PROT 3.6* 10/27/2013 0910   PROT 6.5 07/26/2013 1348   ALBUMIN 1.6* 10/27/2013 0910   ALBUMIN 2.5* 07/26/2013 1348   AST 10 10/27/2013 0910   AST 15 07/26/2013 1348   ALT <5 10/27/2013 0910   ALT 8 07/26/2013 1348   ALKPHOS 43 10/27/2013 0910   ALKPHOS 89 07/26/2013 1348   BILITOT 0.6 10/27/2013 0910   BILITOT 0.40 07/26/2013 1348   GFRNONAA 35* 06/18/2014 1041   GFRAA 41* 06/18/2014 1041    Time In Time Out Total Time Spent with Patient Total Overall Time  0840 1000 75 min 80 min    Greater than 50%  of this time was spent counseling and coordinating care related to the above assessment and plan.   Wadie Lessen NP  Palliative Medicine Team Team Phone # [phone] Pager [phone]  Discussed with Dr Charlies Silvers

## 2014-06-20 DIAGNOSIS — T17998S Other foreign object in respiratory tract, part unspecified causing other injury, sequela: Secondary | ICD-10-CM

## 2014-06-20 DIAGNOSIS — T17908A Unspecified foreign body in respiratory tract, part unspecified causing other injury, initial encounter: Secondary | ICD-10-CM | POA: Insufficient documentation

## 2014-06-20 MED ORDER — MORPHINE SULFATE 2 MG/ML IJ SOLN: 2.0000 mg | INTRAMUSCULAR | Status: DC | PRN

## 2014-06-20 MED ORDER — MORPHINE SULFATE 20 MG/5ML PO SOLN: 2.5000 mg | ORAL | Status: AC | PRN

## 2014-06-20 MED ORDER — MORPHINE SULFATE 2 MG/ML IJ SOLN
2.0000 mg | INTRAMUSCULAR | Status: DC | PRN
Start: 2014-06-20 — End: 2014-06-20
  Administered 2014-06-20 (×2): 2 mg via INTRAVENOUS
  Filled 2014-06-20 (×2): qty 1

## 2014-06-20 MED ORDER — SODIUM CHLORIDE 0.9 % IV SOLN: 0.5000 mg/h | INTRAVENOUS | Status: DC

## 2014-06-20 NOTE — Progress Notes (Signed)
Pt for discharge to [facility].  CSW facilitated pt discharge needs including contacting facility, faxing pt discharge information to facility, discussing with pt family at bedside and providing support, providing RN phone number to call report, and arranging ambulance transport to [facility].   Pt has had multiple family members at bedside this morning and pt family grieving, but wish for pt to transition to [facility].   No further social work needs identified at this time.  CSW signing off.   Alison Murray, MSW, [place] [employer] [phone]

## 2014-06-20 NOTE — Progress Notes (Signed)
Progress Note from the Palliative Medicine Team at [facility]:  -daughter and son at bedside, continued discussion regarding [place] and disposition options  -focus of care is full comfort, with symptom managment  -prognosis is likely hrs to days, family is hopeful for disposition to inpatient facility in [facility], discussed possibility of death in transport, family understand the risk and wish to move forward     Objective: Allergies  Allergen Reactions  . Benadryl [Diphenhydramine Hcl] Other (See Comments)    Urinary retention  . Latex Other (See Comments)    blisters  . Nsaids     unknown   Scheduled Meds: . antiseptic oral rinse  7 mL Mouth Rinse q12n4p  . chlorhexidine  15 mL Mouth Rinse BID  . piperacillin-tazobactam (ZOSYN)  IV  3.375 g Intravenous Q8H   Continuous Infusions: . sodium chloride 50 mL/hr at 07/04/2014 0700   PRN Meds:.LORazepam, morphine injection  BP 81/40 mmHg  Pulse 106  Temp(Src) 101 F (38.3 C) (Axillary)  Resp 23  Ht 5\' 8"  (1.727 m)  Wt 79.833 kg (176 lb)  BMI 26.77 kg/m2  SpO2 94%   PPS:10 % at best    Intake/Output Summary (Last 24 hours) at 06/23/2014 0929 Last data filed at 06/12/2014 0800  Gross per 24 hour  Intake 1071.75 ml  Output    176 ml  Net 895.75 ml       Physical Exam:  General: transitioning at EOL HEENT: noted temporal muscle wasting Chest:  tacyceipnic CVS: RRR Abdomen: soft NT decreased BS Ext: trace ankle edema Neuro: unresponsive to verbal stimuli and gentle touch  Labs: CBC    Component Value Date/Time   WBC 28.6* 06/18/2014 1041   WBC 8.9 07/26/2013 1348   RBC 3.56* 06/18/2014 1041   RBC 3.81* 07/26/2013 1348   HGB 8.6* 06/18/2014 1041   HGB 10.8* 07/26/2013 1348   HCT 28.8* 06/18/2014 1041   HCT 32.9* 07/26/2013 1348   PLT 325 06/18/2014 1041   PLT 281 07/26/2013 1348   MCV 80.9 06/18/2014 1041   MCV 86.4 07/26/2013 1348   MCH 24.2* 06/18/2014 1041   MCH 28.3 07/26/2013 1348   MCHC 29.9* 06/18/2014 1041   MCHC 32.8 07/26/2013 1348   RDW 20.5* 06/18/2014 1041   RDW 16.1* 07/26/2013 1348   LYMPHSABS 0.6* 06/18/2014 1041   LYMPHSABS 1.0 07/26/2013 1348   MONOABS 0.9 06/18/2014 1041   MONOABS 0.5 07/26/2013 1348   EOSABS 0.0 06/18/2014 1041   EOSABS 0.2 07/26/2013 1348   BASOSABS 0.0 06/18/2014 1041   BASOSABS 0.0 07/26/2013 1348    BMET    Component Value Date/Time   NA 146 06/18/2014 1041   NA 140 07/26/2013 1348   K 4.4 06/18/2014 1041   K 4.2 07/26/2013 1348   CL 110 06/18/2014 1041   CL 106 12/22/2012 1000   CO2 23 06/18/2014 1041   CO2 28 07/26/2013 1348   GLUCOSE 178* 06/18/2014 1041   GLUCOSE 109 07/26/2013 1348   GLUCOSE 107* 12/22/2012 1000   BUN 52* 06/18/2014 1041   BUN 29.3* 07/26/2013 1348   CREATININE 1.59* 06/18/2014 1041   CREATININE 1.0 07/26/2013 1348   CALCIUM 12.8* 06/18/2014 1041   CALCIUM 8.9 07/26/2013 1348   GFRNONAA 35* 06/18/2014 1041   GFRAA 41* 06/18/2014 1041    CMP     Component Value Date/Time   NA 146 06/18/2014 1041   NA 140 07/26/2013 1348   K 4.4 06/18/2014 1041   K  4.2 07/26/2013 1348   CL 110 06/18/2014 1041   CL 106 12/22/2012 1000   CO2 23 06/18/2014 1041   CO2 28 07/26/2013 1348   GLUCOSE 178* 06/18/2014 1041   GLUCOSE 109 07/26/2013 1348   GLUCOSE 107* 12/22/2012 1000   BUN 52* 06/18/2014 1041   BUN 29.3* 07/26/2013 1348   CREATININE 1.59* 06/18/2014 1041   CREATININE 1.0 07/26/2013 1348   CALCIUM 12.8* 06/18/2014 1041   CALCIUM 8.9 07/26/2013 1348   PROT 3.6* 10/27/2013 0910   PROT 6.5 07/26/2013 1348   ALBUMIN 1.6* 10/27/2013 0910   ALBUMIN 2.5* 07/26/2013 1348   AST 10 10/27/2013 0910   AST 15 07/26/2013 1348   ALT <5 10/27/2013 0910   ALT 8 07/26/2013 1348   ALKPHOS 43 10/27/2013 0910   ALKPHOS 89 07/26/2013 1348   BILITOT 0.6 10/27/2013 0910   BILITOT 0.40 07/26/2013 1348   GFRNONAA 35* 06/18/2014 1041   GFRAA 41* 06/18/2014 1041    Assessment and Plan:  Prognosis is  likely hrs to days, focus of care is full comfort.  Morphine and Ativan as prescribed for symptom management, hopeful for transition to inpatient hospice in [facility].   Patient Documents Completed or Given: Document Given Completed  Advanced Directives Pkt    MOST    DNR  X  Gone from My Sight    Hard Choices X     Time In Time Out Total Time Spent with Patient Total Overall Time  0800 0835 35 min 35 min    Greater than 50%  of this time was spent counseling and coordinating care related to the above assessment and plan.  Wadie Lessen NP  Palliative Medicine Team Team Phone # [phone] Pager [phone]  Discussed with Dr Charlies Silvers and LCSW 1

## 2014-06-20 NOTE — Progress Notes (Signed)
CSW continuing to follow for residential hospice placement.  CSW received notification from [place] [facility], Abigail Butts that she came and assessed pt and facility has bed available for pt today.   CSW notified MD.  CSW awaiting discharge information in order to facilitate pt discharge needs to [place] [facility].  CSW to continue to follow.   Alison Murray, MSW, [place] [employer] [phone]

## 2014-06-20 NOTE — Progress Notes (Signed)
06/18/2014 1420  Called report to [place] at [facility] in [place].  Patient going to room #204.

## 2014-06-20 NOTE — Plan of Care (Signed)
Problem: Phase II Progression Outcomes Goal: OOB as tolerated unless otherwise ordered Outcome: Not Applicable Date Met:  63/78/58  Comments:  Patient is not alert and is comfort care.

## 2014-06-20 NOTE — Progress Notes (Signed)
Patient ID: Omar [name], male   DOB: Apr 04, 1916, 80 y.o.   MRN: [id number] [facility] PROGRESS NOTE  Omar [name] AJG:[id number] DOB: 1916-04-15 DOA: 06/18/2014 PCP: Leonard Downing, MD  Brief narrative:    80 year old male with past medical history of pulmonary embolism, DVT, history of bladder cancer and transurethral destruction of bladder lesion, status post radiation treatment from 01/02/13- 02/19/13, BPH, history of asbestos exposure. Patient presented to [facility] ED DUE to worsening decline over past 1 week prior to this admission. Patient's son is at the bedside say that patient's wife died in 08/28/22 of this year and his nephew died in a 27-Oct-2022 of this year and ever since then his overall physical health declined. They have noticed their father to be more confused with more difficulty breathing over past 1 week prior to this admission. He had trouble clearing the secretions. In ED, patient was found to be severely hypoxic with oxygen saturation in 60's on room air. His oxygen saturation improved with BiPAP. Other vital signs included hypotension, blood pressure 66/33, respiratory rate is high as 40, heart rate ranging from 36-127, T max 101.1 F. Blood work revealed leukocytosis of 28.6, hemoglobin 8.6, creatinine 1.59 and calcium 12.8. Chest x-ray showed interval development of numerous masses in both lungs consistent with metastatic disease, hazy infiltrate at the right lung base which may represent aspiration pneumonitis. Patient was started on Zosyn for treatment of sepsis related to aspiration pneumonia. Patient was admitted to stepdown unit due to requirement for BiPAP. At this time, family has decide to focus on full comfort care.   Assessment/Plan:     Principal Problem: Severe sepsis secondary to aspiration pneumonia / Leukocytosis - Sepsis criteria met on the admission with vital signs that included severe hypotension with blood pressure of 66/33, tachycardia,  tachypnea, fever, evidence of end organ failure which includes hypoxia of 66% on room air. Source of infection, aspiration pneumonia based on chest x-ray. - Patient started on Zosyn for treatment of aspiration pneumonia. Patient has required BiPAP on the admission. At this time he is on a Ventimask. He is maintaining oxygen saturation above 90%. - Family decided to move [place] full comfort care. We will stop antibiotics at this time. - Order placed for low dose morphine drip. - Per family request we will see if there is an option of residential hospice transfer.   Active Problems: Acute respiratory failure with hypoxia - Respiratory failure with hypoxia secondary to aspiration pneumonia. - Patient is currently on a Ventimask. Oxygen saturations above 90%.  History of bladder cancer / new lung metastasis - Patient had history of bladder cancer. Chest x-ray on this admission shows interval development of numerous masses in both lungs consistent with metastatic disease. Unclear how long this has been present but based on chest x-ray in 26-Oct-2013 there was no evidence of metastatic disease at that time so this must be something that has developed relatively recently.  Acute renal failure - Likely secondary to sepsis - No further blood work to ensure comfort.  Hypercalcemia - Likely secondary to lung malignancy versus dehydration  Anemia of chronic disease - Secondary to history of malignancy  - Hemoglobin is 8.6. No current indications for transfusion   DVT Prophylaxis  - SCDs bilaterally.  Code Status: DNR/DNI Family Communication: plan of care discussed with the patient's family at the bedside  Disposition Plan: remains inpatient    IV access:  Peripheral IV  Procedures and diagnostic studies:    Dg Chest  Portable 1 View 06/18/2014 1. Interval development of numerous masses in both lungs consistent with metastatic disease. 2. Hazy infiltrate at the right lung base may  represent aspiration pneumonitis. Electronically Signed By: Rozetta Nunnery M.D. On: 06/18/2014 10:01   Medical Consultants:  Palliative care   Other Consultants:  None   IAnti-Infectives:   Zosyn 06/18/2014 --> 06/14/2014   Leisa Lenz, MD  [facility] Pager [phone]  If 7PM-7AM, please contact night-coverage [email or web address] Password [email or web address] 07/04/2014, 10:38 AM   LOS: 2 days    HPI/Subjective: No acute overnight events.  Objective: Filed Vitals:   06/23/2014 0500 07/10/2014 0600 06/25/2014 0700 06/14/2014 0800  BP: $Re'79/42 77/34 78/25 'GPp$ 81/40  Pulse: 43 108 103 106  Temp:    101 F (38.3 C)  TempSrc:    Axillary  Resp: $Remo'22 21 22 23  'SgkZg$ Height:      Weight:      SpO2: 95% 96% 96% 94%    Intake/Output Summary (Last 24 hours) at 06/26/2014 1038 Last data filed at 06/23/2014 0800  Gross per 24 hour  Intake 771.75 ml  Output    176 ml  Net 595.75 ml    Exam:   General:  Pt is not in distress, has venti mask on   Cardiovascular: tachycardic, S1/S2 appreciated   Respiratory: diminished, congested   Abdomen: firm to palpation, non tender, bowel sounds present  Extremities: UE and LE pitting edema +1-2, pulses DP and PT palpable bilaterally  Neuro: Grossly nonfocal  Data Reviewed: Basic Metabolic Panel:  Recent Labs Lab 06/18/14 1041  NA 146  K 4.4  CL 110  CO2 23  GLUCOSE 178*  BUN 52*  CREATININE 1.59*  CALCIUM 12.8*   Liver Function Tests: No results for input(s): AST, ALT, ALKPHOS, BILITOT, PROT, ALBUMIN in the last 168 hours. No results for input(s): LIPASE, AMYLASE in the last 168 hours. No results for input(s): AMMONIA in the last 168 hours. CBC:  Recent Labs Lab 06/18/14 1041  WBC 28.6*  NEUTROABS 27.1*  HGB 8.6*  HCT 28.8*  MCV 80.9  PLT 325   Cardiac Enzymes: No results for input(s): CKTOTAL, CKMB, CKMBINDEX, TROPONINI in the last 168 hours. BNP: Invalid input(s): POCBNP CBG: No results for input(s): GLUCAP in the last 168  hours.  Recent Results (from the past 240 hour(s))  MRSA PCR Screening     Status: None   Collection Time: 06/18/14  3:02 PM  Result Value Ref Range Status   MRSA by PCR NEGATIVE NEGATIVE Final    Comment:        The GeneXpert MRSA Assay (FDA approved for NASAL specimens only), is one component of a comprehensive MRSA colonization surveillance program. It is not intended to diagnose MRSA infection nor to guide or monitor treatment for MRSA infections.      Scheduled Meds: . antiseptic oral rinse  7 mL Mouth Rinse q12n4p  . chlorhexidine  15 mL Mouth Rinse BID   Continuous Infusions: . sodium chloride 50 mL/hr at 06/11/2014 0700  . morphine

## 2014-06-20 NOTE — Progress Notes (Signed)
CSW continuing to follow.   CSW received notification from PMT NP, Wadie Lessen that pt family is interested in [facility] of [facility] and would be agreeable to pt transferring today if bed available.   CSW contacted [place] of [facility] and mad referral. Per [place] of [place] will be visiting pt and pt family at bedside between 12-12:15 pm today in order to assess pt and notify about if bed available for pt to transition to [place] [facility] today.  CSW met with pt son, [name] at bedside. Pt son was aware that representative from [place] [facility] would be coming to the hospital shortly. CSW provided pt son CSW contact information in order for representative from [place] [facility] or pt son to contact CSW once visit complete.  CSW to continue to follow.  Alison Murray, MSW, [place] [facility] [phone]

## 2014-06-20 NOTE — Discharge Summary (Signed)
Physician Discharge Summary  Omar [name]:[id number] DOB: Oct 12, 1915 DOA: 06/18/2014  PCP: Leonard Downing, MD  Admit date: 06/18/2014 Discharge date: 06/14/2014  Principal Problem:   Sepsis Active Problems:   Aspiration pneumonia   Acute respiratory failure with hypoxia   Leukocytosis   Cancer of dome of urinary bladder   Lung metastasis   Anemia of chronic disease   Acute renal insufficiency   Hypercalcemia   DNR (do not resuscitate) discussion   Dyspnea   Aspiration into airway    Discharge Condition: stable   Diet recommendation: as tolerated   History of present illness:  80 year old male with past medical history of pulmonary embolism, DVT, history of bladder cancer and transurethral destruction of bladder lesion, status post radiation treatment from 01/02/13- 02/19/13, BPH, history of asbestos exposure. Patient presented to [facility] ED DUE to worsening decline over past 1 week prior to this admission. Patient's son is at the bedside say that patient's wife died in 08-29-22 of this year and his nephew died in a 10-28-2022 of this year and ever since then his overall physical health declined. They have noticed their father to be more confused with more difficulty breathing over past 1 week prior to this admission. He had trouble clearing the secretions. In ED, patient was found to be severely hypoxic with oxygen saturation in 60's on room air. His oxygen saturation improved with BiPAP. Other vital signs included hypotension, blood pressure 66/33, respiratory rate is high as 40, heart rate ranging from 36-127, T max 101.1 F. Blood work revealed leukocytosis of 28.6, hemoglobin 8.6, creatinine 1.59 and calcium 12.8. Chest x-ray showed interval development of numerous masses in both lungs consistent with metastatic disease, hazy infiltrate at the right lung base which may represent aspiration pneumonitis. Patient was started on Zosyn for treatment of sepsis related to aspiration  pneumonia. Patient was admitted to stepdown unit due to requirement for BiPAP. At this time, family has decide to focus on full comfort care.   Assessment/Plan:     Principal Problem: Severe sepsis secondary to aspiration pneumonia / Leukocytosis - Sepsis criteria met on the admission with vital signs that included severe hypotension with blood pressure of 66/33, tachycardia, tachypnea, fever, evidence of end organ failure which includes hypoxia of 66% on room air. Source of infection, aspiration pneumonia based on chest x-ray. - Patient started on Zosyn for treatment of aspiration pneumonia. Patient has required BiPAP on the admission. At this time he is on a Ventimask. He is maintaining oxygen saturation above 90%. - Family decided to move [place] full comfort care. We will stop antibiotics at this time.   Active Problems: Acute respiratory failure with hypoxia - Respiratory failure with hypoxia secondary to aspiration pneumonia. - Patient is currently on a Ventimask. Oxygen saturations above 90%.  History of bladder cancer / new lung metastasis - Patient had history of bladder cancer. Chest x-ray on this admission shows interval development of numerous masses in both lungs consistent with metastatic disease. Unclear how long this has been present but based on chest x-ray in 10/27/13 there was no evidence of metastatic disease at that time so this must be something that has developed relatively recently.  Acute renal failure - Likely secondary to sepsis - No further blood work to ensure comfort.  Hypercalcemia - Likely secondary to lung malignancy versus dehydration  Anemia of chronic disease - Secondary to history of malignancy  - Hemoglobin is 8.6. No current indications for transfusion   DVT Prophylaxis  -  SCDs bilaterally.  Code Status: DNR/DNI Family Communication: plan of care discussed with the patient's family at the bedside     IV access:  Peripheral  IV  Procedures and diagnostic studies:   Dg Chest Portable 1 View 06/18/2014 1. Interval development of numerous masses in both lungs consistent with metastatic disease. 2. Hazy infiltrate at the right lung base may represent aspiration pneumonitis. Electronically Signed By: Rozetta Nunnery M.D. On: 06/18/2014 10:01   Medical Consultants:  Palliative care   Other Consultants:  None   IAnti-Infectives:   Zosyn 06/18/2014 --> 06/16/2014   Signed:  Leisa Lenz, MD  [facility] 07/08/2014, 1:58 PM  Pager #: [phone]   Discharge Exam: Filed Vitals:   07/11/2014 1100  BP: 77/36  Pulse: 47  Temp:   Resp: 24   Filed Vitals:   06/28/2014 0700 07/06/2014 0800 06/16/2014 1000 06/16/2014 1100  BP: $Re'78/25 81/40 83/42 'vNX$ 77/36  Pulse: 103 106 117 47  Temp:  101 F (38.3 C)    TempSrc:  Axillary    Resp: $Remo'22 23 30 24  'XPNQO$ Height:      Weight:      SpO2: 96% 94% 94% 94%    General: Pt is alert, not in acute distress Cardiovascular: Regular rate and rhythm, S1/S2 appreciated  Respiratory: diminished BS, congested Abdominal: Soft, non tender, non distended, bowel sounds +, no guarding   Discharge Instructions  Discharge Instructions    Call MD for:  difficulty breathing, headache or visual disturbances    Complete by:  As directed      Call MD for:  hives    Complete by:  As directed      Call MD for:  persistant nausea and vomiting    Complete by:  As directed      Call MD for:  severe uncontrolled pain    Complete by:  As directed      Diet - low sodium heart healthy    Complete by:  As directed      Increase activity slowly    Complete by:  As directed             Medication List    STOP taking these medications        Cranberry 450 MG Caps     ferrous sulfate 325 (65 FE) MG EC tablet     finasteride 5 MG tablet  Commonly known as:  PROSCAR     Fish Oil 1000 MG Caps     pantoprazole 40 MG tablet  Commonly known as:  PROTONIX      traZODone 50 MG tablet  Commonly known as:  DESYREL     Vitamin D 2000 UNITS Caps     warfarin 3 MG tablet  Commonly known as:  COUMADIN      TAKE these medications        acetaminophen 500 MG tablet  Commonly known as:  TYLENOL  Take 1-2 tablets (500-1,000 mg total) by mouth every 6 (six) hours as needed for moderate pain.     furosemide 40 MG tablet  Commonly known as:  LASIX  Take 20 mg by mouth daily.     morphine 20 MG/5ML solution  Take 0.6 mLs (2.4 mg total) by mouth every 2 (two) hours as needed for pain.           Follow-up Information    Follow up with Leonard Downing, MD.   Specialty:  Family Medicine   Why:  As needed  Contact information:   [address] [place] [address] [phone]        The results of significant diagnostics from this hospitalization (including imaging, microbiology, ancillary and laboratory) are listed below for reference.    Significant Diagnostic Studies: Dg Chest Portable 1 View  06/18/2014   CLINICAL DATA:  Aspiration.  Low O2 saturation.  EXAM: PORTABLE CHEST - 1 VIEW  COMPARISON:  10/27/2013  FINDINGS: The patient has not multiple new masses in both lungs consistent with tumor. There is also an ill-defined infiltrate at the right lung base which could represent aspiration pneumonitis.  Extensive pleural calcification is unchanged, consistent with previous asbestos exposure. Heart size and pulmonary vascularity are normal. No acute osseous abnormality.  IMPRESSION: 1. Interval development of numerous masses in both lungs consistent with metastatic disease. 2. Hazy infiltrate at the right lung base may represent aspiration pneumonitis.   Electronically Signed   By: Rozetta Nunnery M.D.   On: 06/18/2014 10:01    Microbiology: Recent Results (from the past 240 hour(s))  MRSA PCR Screening     Status: None   Collection Time: 06/18/14  3:02 PM  Result Value Ref Range Status   MRSA by PCR NEGATIVE NEGATIVE Final     Comment:        The GeneXpert MRSA Assay (FDA approved for NASAL specimens only), is one component of a comprehensive MRSA colonization surveillance program. It is not intended to diagnose MRSA infection nor to guide or monitor treatment for MRSA infections.      Labs: Basic Metabolic Panel:  Recent Labs Lab 06/18/14 1041  NA 146  K 4.4  CL 110  CO2 23  GLUCOSE 178*  BUN 52*  CREATININE 1.59*  CALCIUM 12.8*   Liver Function Tests: No results for input(s): AST, ALT, ALKPHOS, BILITOT, PROT, ALBUMIN in the last 168 hours. No results for input(s): LIPASE, AMYLASE in the last 168 hours. No results for input(s): AMMONIA in the last 168 hours. CBC:  Recent Labs Lab 06/18/14 1041  WBC 28.6*  NEUTROABS 27.1*  HGB 8.6*  HCT 28.8*  MCV 80.9  PLT 325   Cardiac Enzymes: No results for input(s): CKTOTAL, CKMB, CKMBINDEX, TROPONINI in the last 168 hours. BNP: BNP (last 3 results) No results for input(s): PROBNP in the last 8760 hours. CBG: No results for input(s): GLUCAP in the last 168 hours.  Time coordinating discharge: Over 30 minutes

## 2014-06-20 NOTE — Discharge Instructions (Signed)
Hospice °Hospice is a service that is designed to provide people who are terminally ill and their families with medical, spiritual, and psychological support. Its aim is to improve your quality of life by keeping you as alert and comfortable as possible. Hospice is performed by a team of health care professionals and volunteers who: °· Help keep you comfortable. Hospice can be provided in your home or in a homelike setting. The hospice staff works with your family and friends to help meet your needs. You will enjoy the support of loved ones by receiving much of your basic care from family and friends. °· Provide pain relief and manage your symptoms. The staff supply all necessary medicines and equipment. °· Provide companionship when you are alone. °· Allow you and your family to rest. They may do light housekeeping, prepare meals, and run errands. °· Provide counseling. They will make sure your emotional, spiritual, and social needs and those of your family are being met. °· Provide spiritual care. Spiritual care is individualized to meet your needs and your family's needs. It may involve helping you look at what death means to you, say goodbye, or perform a specific religious ceremony or ritual. °Hospice teams often include: °· A nurse. °· A doctor. °· Social workers. °· Religious leaders (such as a chaplain). °· Trained volunteers. °WHEN SHOULD HOSPICE CARE BEGIN? °Most people who use hospice are believed to have fewer than 6 months to live. Your family and health care providers can help you decide when hospice services should begin. If your condition improves, you may discontinue the program. °WHAT SHOULD I CONSIDER BEFORE SELECTING A PROGRAM? °Most hospice programs are run by nonprofit, independent organizations. Some are affiliated with hospitals, nursing homes, or home health care agencies. Hospice programs can take place in the home or at a hospice center, hospital, or skilled nursing facility. When choosing  a hospice program, ask the following questions: °· What services are available to me? °· What services are offered to my loved ones? °· How involved are my loved ones? °· How involved is my health care provider? °· Who makes up the hospice care team? How are they trained or screened? °· How will my pain and symptoms be managed? °· If my circumstances change, can the services be provided in a different setting, such as my home or in the hospital? °· Is the program reviewed and licensed by the state or certified in some other way? °WHERE CAN I LEARN MORE ABOUT HOSPICE? °You can learn about existing hospice programs in your area from your health care providers. You can also read more about hospice online. The websites of the following organizations contain helpful information: °· The National Hospice and Palliative Care Organization (NHPCO). °· The Hospice Association of America (HAA). °· The Hospice Education Institute. °· The American Cancer Society (ACS). °· Hospice Net. °Document Released: 10/15/2003 Document Revised: 07/03/2013 Document Reviewed: 05/08/2013 °ExitCare® Patient Information ©2015 [employer]. This information is not intended to replace advice given to you by your health care provider. Make sure you discuss any questions you have with your health care provider. ° °

## 2014-06-20 NOTE — Progress Notes (Signed)
Nutrition Brief Note  Patient identified as Braden Score < 12 Chart reviewed. Pt now transitioning to comfort care.  No further nutrition interventions warranted at this time.  Please consult as needed.   Atlee Abide MS RD LDN Clinical Dietitian [phone]

## 2014-07-12 DEATH — deceased

## 2015-01-06 ENCOUNTER — Other Ambulatory Visit: Payer: Self-pay

## 2016-03-13 IMAGING — CT CT ABD-PELV W/O CM
2 of 4 series · 16 of 46 positions shown, 18 images · non-contrast
Comparison: CT ABD/PELVIS W CM dated 04/24/2013

CLINICAL DATA: Low hemoglobin.  Question intra-abdominal bleed.

EXAM:
CT ABDOMEN AND PELVIS WITHOUT CONTRAST
TECHNIQUE: Multidetector CT imaging of the abdomen and pelvis was performed
following the standard protocol without intravenous contrast.

[Series 2: abd/ pelvis 5.0 i30f 1 · axial · 0.77mm/px · z∈[-44,+421]mm · 13 of 103 slices shown, 15 images]
[im 5/103  soft-tissue]
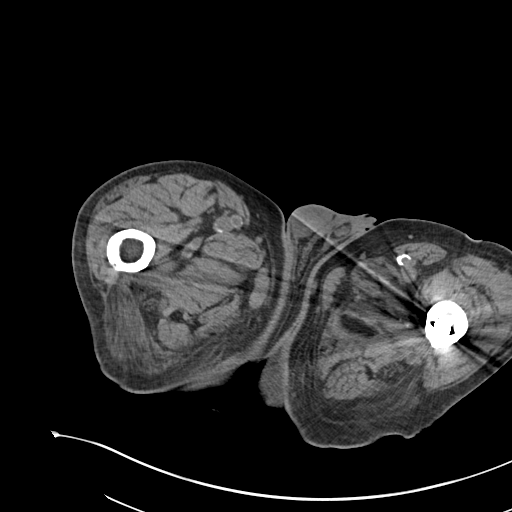
[im 5/103  bone]
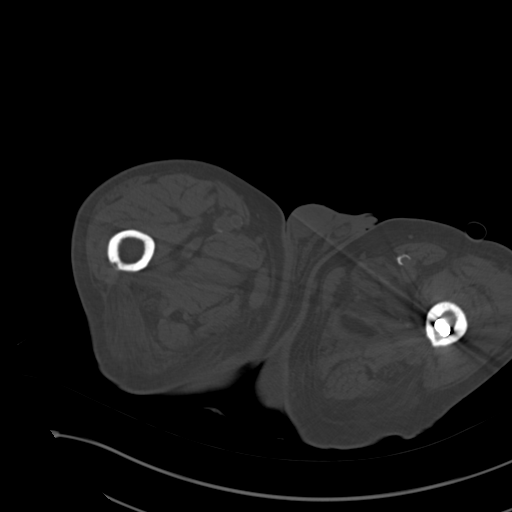
[im 13/103  soft-tissue]
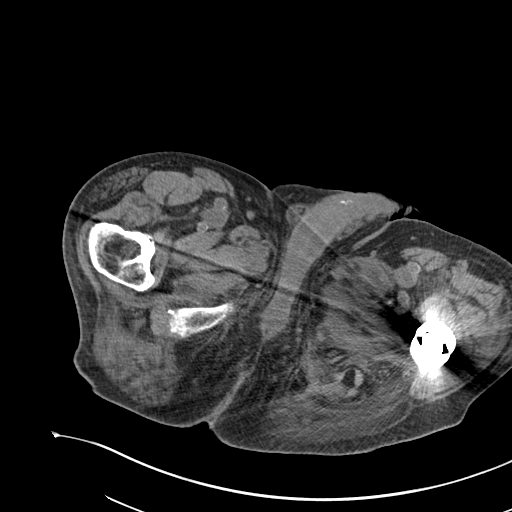
[im 21/103  soft-tissue]
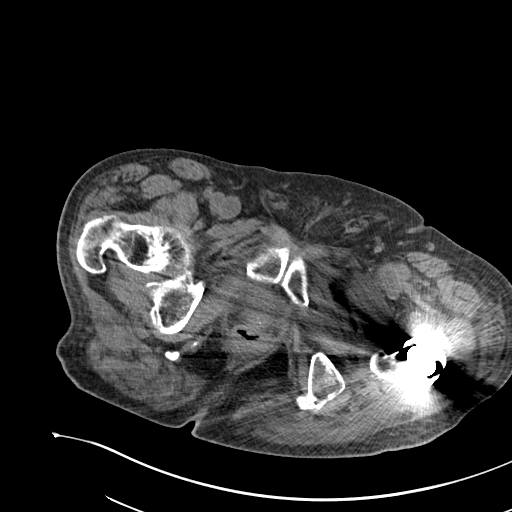
[im 29/103  soft-tissue]
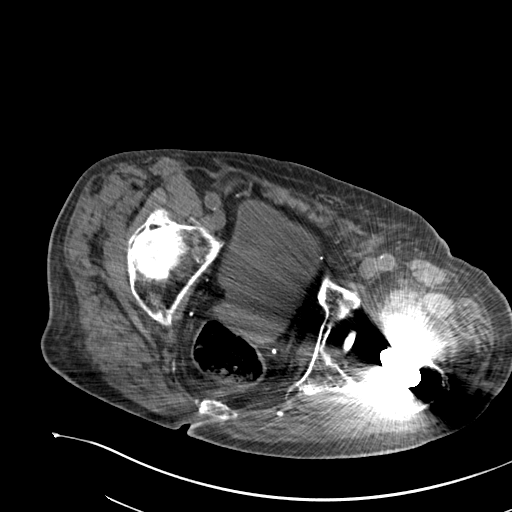
[im 37/103  soft-tissue]
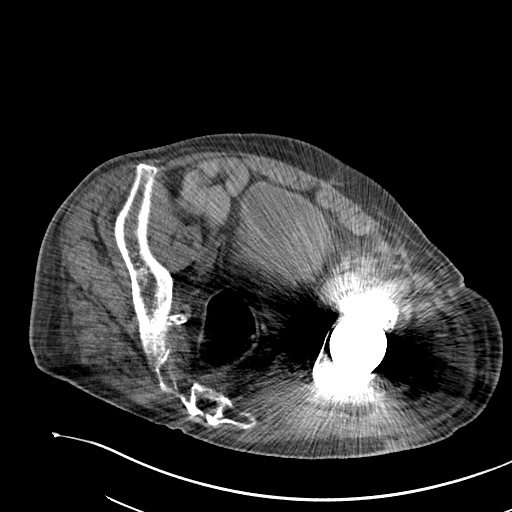
[im 45/103  soft-tissue]
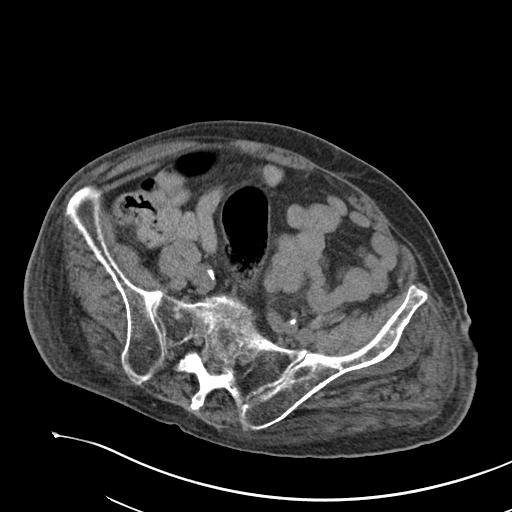
[im 54/103  soft-tissue]
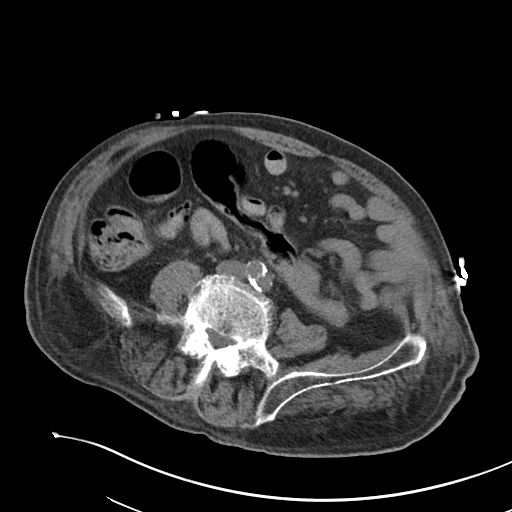
[im 58/103  soft-tissue]
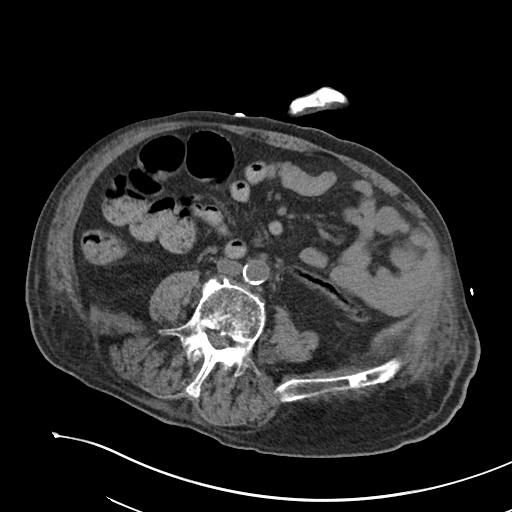
[im 66/103  soft-tissue]
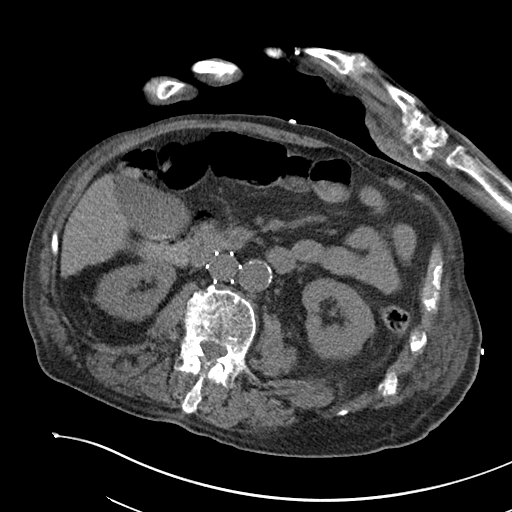
[im 66/103  bone]
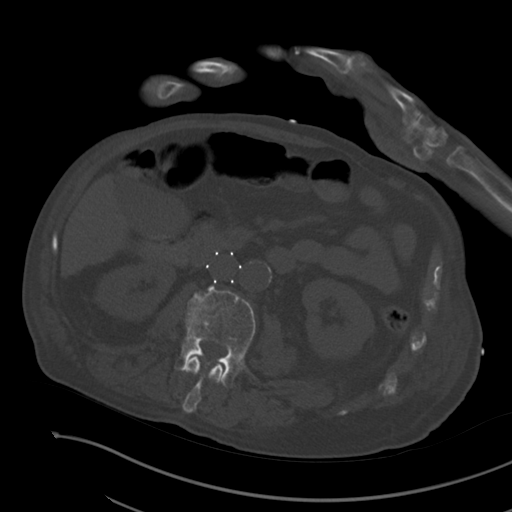
[im 74/103  soft-tissue]
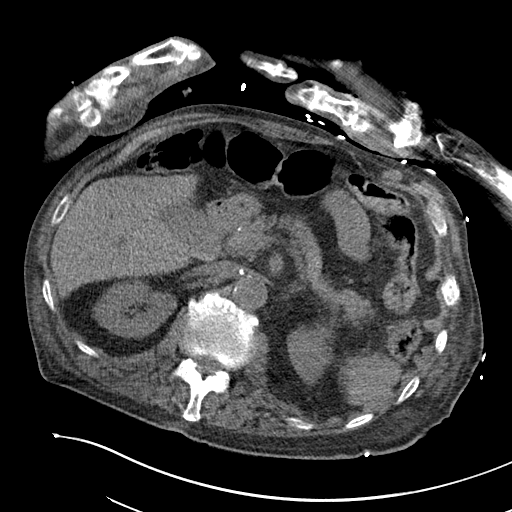
[im 82/103  soft-tissue]
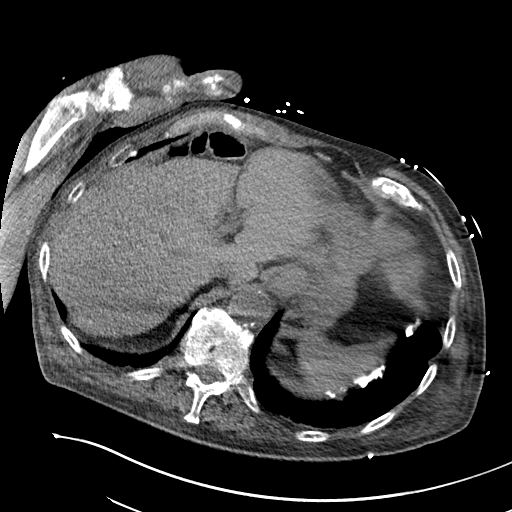
[im 90/103  soft-tissue]
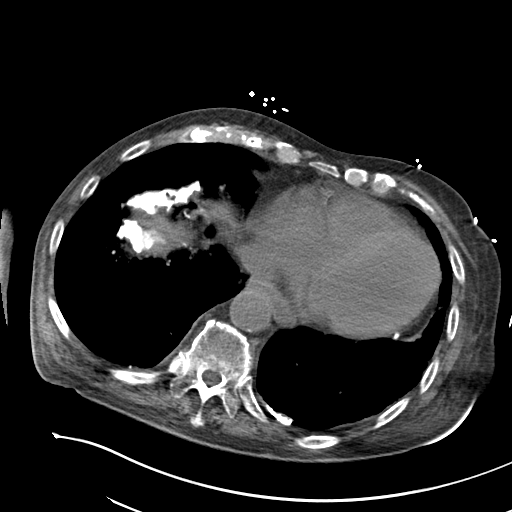
[im 98/103  soft-tissue]
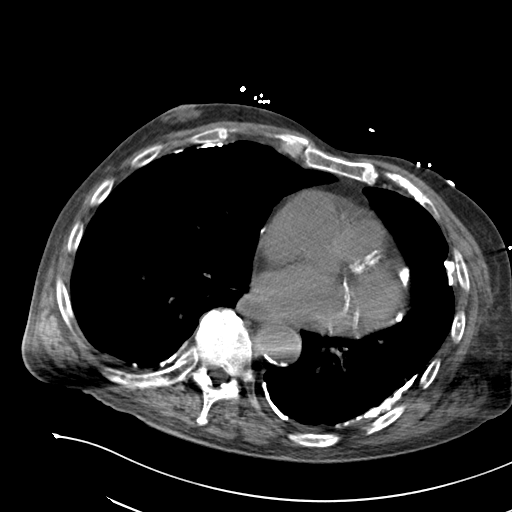

[Series 5: cor st · coronal · 0.83mm/px · 3 of 90 slices shown]
[im 30/90  soft-tissue]
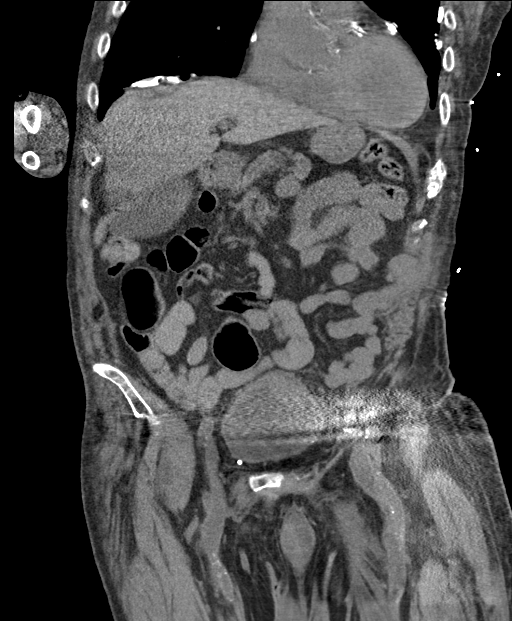
[im 40/90  soft-tissue]
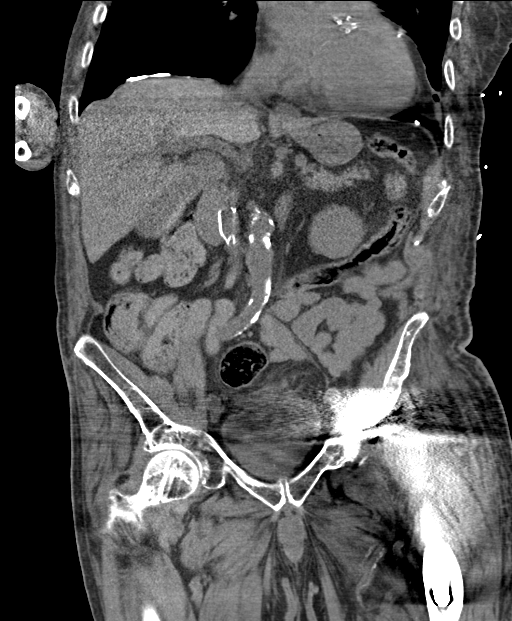
[im 50/90  soft-tissue]
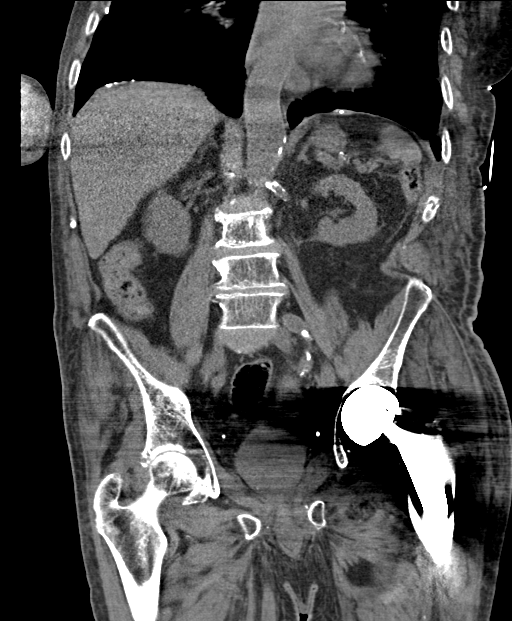

[16 of 46 positions shown; findings below may reference images not displayed]

FINDINGS: Numerous bilateral calcified pleural plaques. Mild cardiomegaly. No
confluent airspace opacities or effusions.

Small layering gallstones within the gallbladder. Liver, pancreas,
spleen, adrenals and kidneys have an unremarkable unenhanced
appearance. IVC filter is noted in the infrarenal IVC. Aortic and
iliac calcifications without aneurysm.

There is prostate enlargement. There is beam hardening artifact from
a left hip replacement which obscures lower pelvic structures.
Previously seen large posterior bladder wall diverticulum is
partially obscured by the beam hardening artifact. 9 mm
calcification in the left side of the pelvis was shown on prior CT
to be within this large diverticulum.

No evidence of retroperitoneal hematoma. No free fluid, free air or
adenopathy. Stomach, large and small bowel are grossly unremarkable.

Diffuse degenerative changes throughout the thoracolumbar spine. No
acute bony abnormality.
IMPRESSION: No retroperitoneal or intra-abdominal hemorrhage.

Cholelithiasis.

Markedly enlarged prostate. Large posterior bladder wall
diverticulum noted on prior CT partially obscured by left hip
replacement.

Calcified pleural plaques bilaterally, most compatible with asbestos
related pleural disease.

## 2016-11-02 IMAGING — DX DG CHEST 1V PORT
1 series · 1 of 1 positions shown · non-contrast
Comparison: 10/27/2013

CLINICAL DATA: Aspiration.  Low O2 saturation.

EXAM:
PORTABLE CHEST - 1 VIEW

[AP]
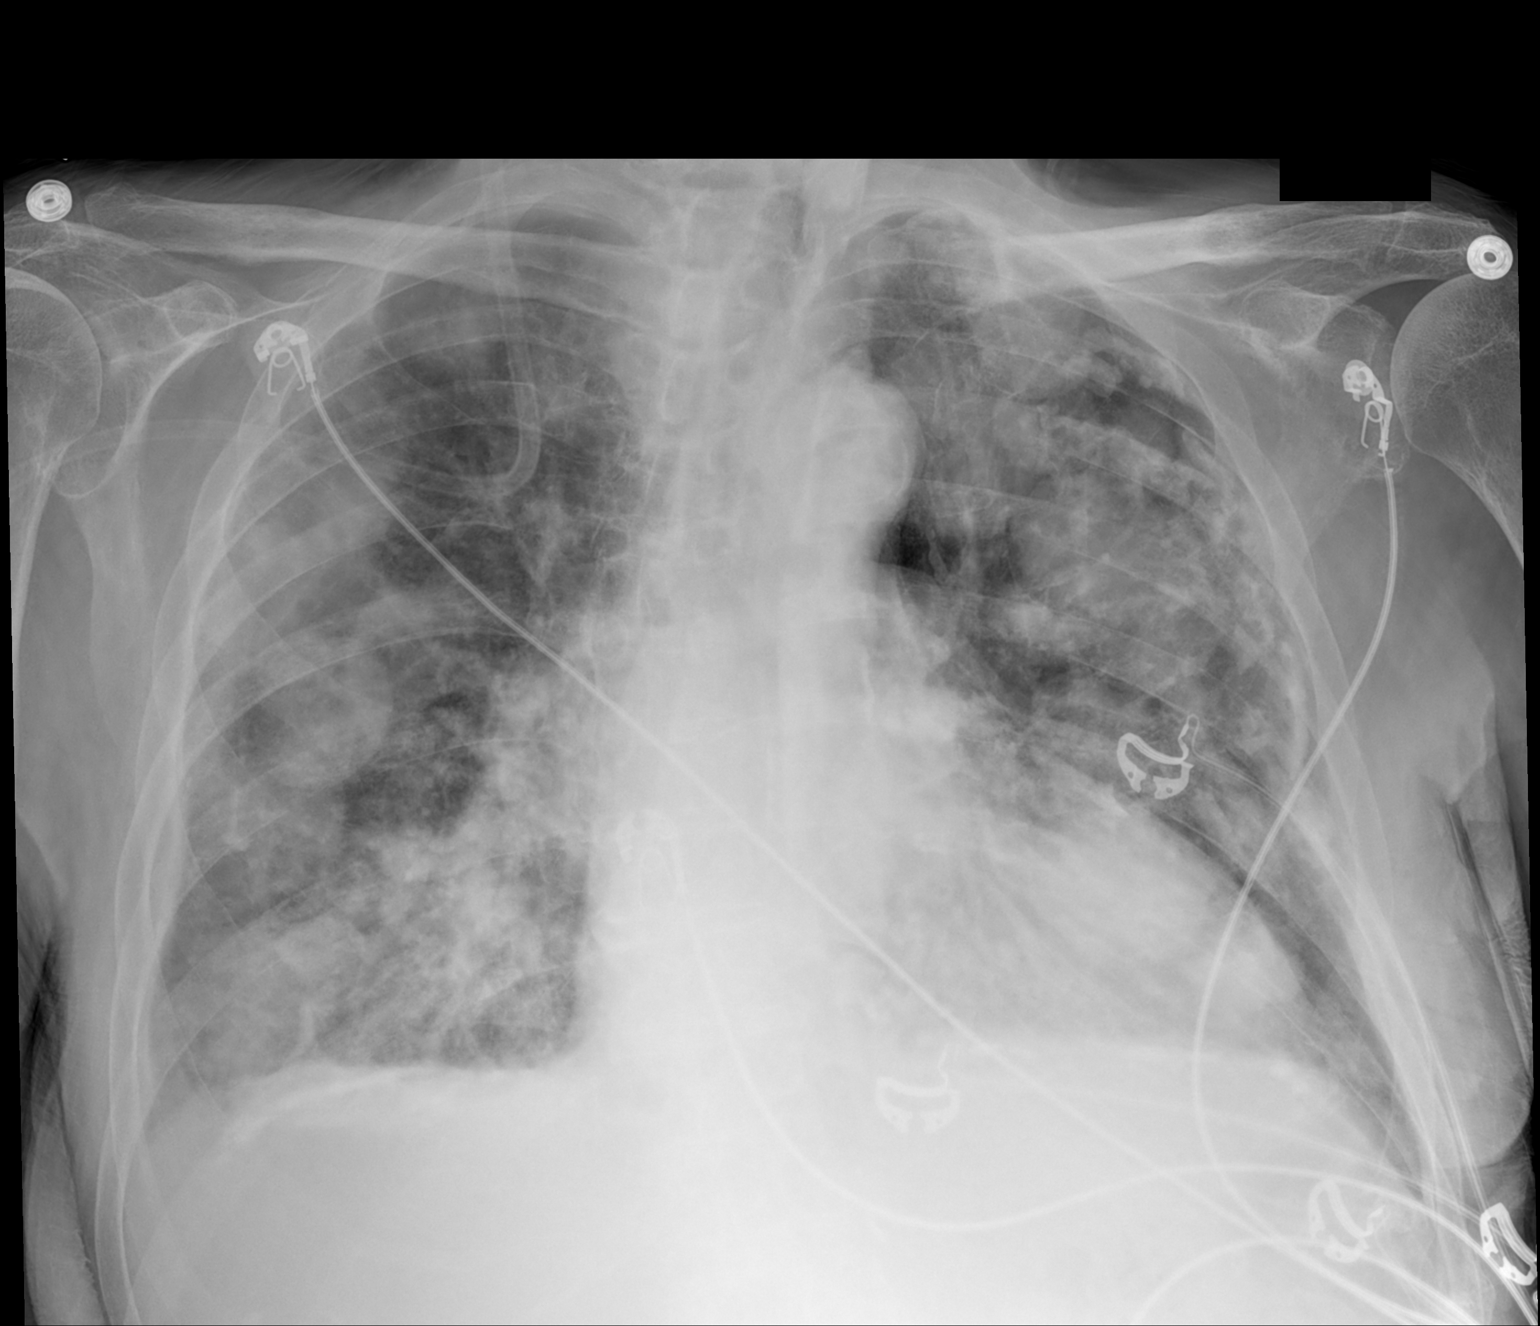

[1 of 1 positions shown; findings below may reference images not displayed]

FINDINGS: The patient has not multiple new masses in both lungs consistent
with tumor. There is also an ill-defined infiltrate at the right
lung base which could represent aspiration pneumonitis.

Extensive pleural calcification is unchanged, consistent with
previous asbestos exposure. Heart size and pulmonary vascularity are
normal. No acute osseous abnormality.
IMPRESSION: 1. Interval development of numerous masses in both lungs consistent
with metastatic disease.
2. Hazy infiltrate at the right lung base may represent aspiration
pneumonitis.
